# Patient Record
Sex: Female | Born: 1995 | Race: Black or African American | Hispanic: No | Marital: Single | Smoking: Former smoker
Health system: Southern US, Community
[De-identification: ages and names within clinical notes are randomized; demographics above are authoritative.]

## PROBLEM LIST (undated history)

## (undated) ENCOUNTER — Inpatient Hospital Stay (HOSPITAL_COMMUNITY): Payer: Self-pay

## (undated) DIAGNOSIS — J45909 Unspecified asthma, uncomplicated: Secondary | ICD-10-CM

## (undated) HISTORY — PX: TOE SURGERY: SHX1073

---
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
## 2013-01-06 ENCOUNTER — Encounter (HOSPITAL_COMMUNITY): Payer: Self-pay | Admitting: Emergency Medicine

## 2013-01-06 DIAGNOSIS — R112 Nausea with vomiting, unspecified: Secondary | ICD-10-CM | POA: Insufficient documentation

## 2013-01-06 DIAGNOSIS — R1011 Right upper quadrant pain: Secondary | ICD-10-CM | POA: Insufficient documentation

## 2013-01-06 DIAGNOSIS — Z8742 Personal history of other diseases of the female genital tract: Secondary | ICD-10-CM | POA: Insufficient documentation

## 2013-01-06 DIAGNOSIS — Z3202 Encounter for pregnancy test, result negative: Secondary | ICD-10-CM | POA: Insufficient documentation

## 2013-01-06 DIAGNOSIS — R197 Diarrhea, unspecified: Secondary | ICD-10-CM | POA: Insufficient documentation

## 2013-01-06 DIAGNOSIS — F17200 Nicotine dependence, unspecified, uncomplicated: Secondary | ICD-10-CM | POA: Insufficient documentation

## 2013-01-06 DIAGNOSIS — R52 Pain, unspecified: Secondary | ICD-10-CM | POA: Insufficient documentation

## 2013-01-06 LAB — COMPREHENSIVE METABOLIC PANEL
AST: 16 U/L (ref 0–37)
Albumin: 3.8 g/dL (ref 3.5–5.2)
BUN: 8 mg/dL (ref 6–23)
CO2: 28 mEq/L (ref 19–32)
Chloride: 100 mEq/L (ref 96–112)
Creatinine, Ser: 0.66 mg/dL (ref 0.50–1.10)
Glucose, Bld: 95 mg/dL (ref 70–99)
Total Protein: 8 g/dL (ref 6.0–8.3)

## 2013-01-06 LAB — CBC WITH DIFFERENTIAL/PLATELET
Basophils Relative: 0 % (ref 0–1)
Eosinophils Relative: 1 % (ref 0–5)
MCV: 84.7 fL (ref 78.0–100.0)
Monocytes Absolute: 0.8 10*3/uL (ref 0.1–1.0)
Platelets: 365 10*3/uL (ref 150–400)
RBC: 4.39 MIL/uL (ref 3.87–5.11)
RDW: 14.4 % (ref 11.5–15.5)

## 2013-01-06 LAB — URINALYSIS, ROUTINE W REFLEX MICROSCOPIC
Glucose, UA: NEGATIVE mg/dL
Hgb urine dipstick: NEGATIVE
Ketones, ur: NEGATIVE mg/dL
Nitrite: NEGATIVE
Protein, ur: NEGATIVE mg/dL
Urobilinogen, UA: 0.2 mg/dL (ref 0.0–1.0)

## 2013-01-06 LAB — POCT PREGNANCY, URINE: Preg Test, Ur: NEGATIVE

## 2013-01-06 NOTE — ED Notes (Signed)
Pt c/o RLQ pain onset yesterday with nausea, vomiting and diarrhea.

## 2013-01-07 ENCOUNTER — Emergency Department (HOSPITAL_COMMUNITY): Payer: Medicaid Other

## 2013-01-07 ENCOUNTER — Emergency Department (HOSPITAL_COMMUNITY)
Admission: EM | Admit: 2013-01-07 | Discharge: 2013-01-07 | Disposition: A | Discharge status: Home / Self Care | Payer: Medicaid Other | Attending: Emergency Medicine | Admitting: Emergency Medicine

## 2013-01-07 DIAGNOSIS — R197 Diarrhea, unspecified: Secondary | ICD-10-CM

## 2013-01-07 DIAGNOSIS — R109 Unspecified abdominal pain: Secondary | ICD-10-CM

## 2013-01-07 DIAGNOSIS — R112 Nausea with vomiting, unspecified: Secondary | ICD-10-CM

## 2013-01-07 MED ORDER — ONDANSETRON 4 MG PO TBDP
8.0000 mg | ORAL_TABLET | Freq: Once | ORAL | Status: AC
  Administered 2013-01-07: 8 mg via ORAL
  Filled 2013-01-07: qty 2

## 2013-01-07 MED ORDER — ONDANSETRON 8 MG PO TBDP: 8.0000 mg | ORAL_TABLET | Freq: Three times a day (TID) | ORAL | Status: DC | PRN

## 2013-01-07 NOTE — ED Notes (Signed)
Pt was asking about getting tested for STD's, nurse informed pt of the process. Pt then refused, saying she "doesn't want to stay here any longer"

## 2013-01-07 NOTE — ED Notes (Signed)
Pt ambulated to bathroom 

## 2013-01-07 NOTE — ED Notes (Signed)
MD at bedside. 

## 2013-01-07 NOTE — ED Provider Notes (Signed)
History     CSN: [id number]  Arrival date & time 01/06/13  2018   First MD Initiated Contact with Patient 01/07/13 0023      Chief Complaint  Patient presents with  . Abdominal Pain    (Consider location/radiation/quality/duration/timing/severity/associated sxs/prior treatment) HPI 17 year old female presents to the emergency department with complaint of abdominal pain associated with nausea and vomiting, diarrhea.  She reports the pain is sharp and radiates into her back.  Symptoms have been ongoing for the last 2 days.  Pain is somewhat worse after eating.  It is intermittent.  She's not having any pain currently.  She reports her last bowel movement was 2-3 days ago which is unusual, but she has not been eating very much the last 2 days either.  No sick contacts, no unusual foods, no travel.  Mother has history of gallbladder disease.  Patient without prior history of cholelithiasis.  Patient reports she has a history of amenorrhea, last period was 3 months ago.  She's been advised to have an ultrasound as she was told by her GYN that "my ovaries are swollen".  She denies any urinary symptoms, no vaginal discharge.  No lower abdominal pain  History reviewed. No pertinent past medical history.  History reviewed. No pertinent past surgical history.  No family history on file.  History  Substance Use Topics  . Smoking status: Current Every Day Smoker  . Smokeless tobacco: Not on file  . Alcohol Use: No    OB History   Grav Para Term Preterm Abortions TAB SAB Ect Mult Living                  Review of Systems  All other systems reviewed and are negative.    Allergies  Onion and Pork-derived products  Home Medications  No current outpatient prescriptions on file.  BP 138/79  Pulse 66  Temp(Src) 98.2 F (36.8 C) (Oral)  Resp 20  SpO2 100%  LMP 11/05/2012  Physical Exam  Nursing note and vitals reviewed. Constitutional: She is oriented to person, place, and time.  She appears well-developed and well-nourished.  Obese female, irritated but in no acute distress  HENT:  Head: Normocephalic and atraumatic.  Nose: Nose normal.  Mouth/Throat: Oropharynx is clear and moist.  Eyes: Conjunctivae and EOM are normal. Pupils are equal, round, and reactive to light.  Neck: Normal range of motion. Neck supple. No JVD present. No tracheal deviation present. No thyromegaly present.  Cardiovascular: Normal rate, regular rhythm, normal heart sounds and intact distal pulses.  Exam reveals no gallop and no friction rub.   No murmur heard. Pulmonary/Chest: Effort normal and breath sounds normal. No stridor. No respiratory distress. She has no wheezes. She has no rales. She exhibits no tenderness.  Abdominal: Soft. Bowel sounds are normal. She exhibits no distension and no mass. There is tenderness (Tender to palpation throughout the abdomen, worse in the right upper quadrant). There is no rebound and no guarding.  Musculoskeletal: Normal range of motion. She exhibits no edema and no tenderness.  Lymphadenopathy:    She has no cervical adenopathy.  Neurological: She is alert and oriented to person, place, and time. She exhibits normal muscle tone. Coordination normal.  Skin: Skin is warm and dry. No rash noted. No erythema. No pallor.  Psychiatric: She has a normal mood and affect. Her behavior is normal. Judgment and thought content normal.    ED Course  Procedures (including critical care time)  Labs Reviewed  COMPREHENSIVE  METABOLIC PANEL - Abnormal; Notable for the following:    Total Bilirubin 0.1 (*)    All other components within normal limits  URINALYSIS, ROUTINE W REFLEX MICROSCOPIC - Abnormal; Notable for the following:    Leukocytes, UA SMALL (*)    All other components within normal limits  URINE MICROSCOPIC-ADD ON - Abnormal; Notable for the following:    Squamous Epithelial / LPF FEW (*)    All other components within normal limits  CBC WITH  DIFFERENTIAL  POCT PREGNANCY, URINE   No results found.   1. Abdominal pain, acute   2. Nausea vomiting and diarrhea       MDM  17 year old female with 2 days intermittent sharp pain mainly in her right upper quadrant.  Concern for gallstones.  Your lab work is unremarkable she's had no fever.  No vomiting since arriving at the emergency Department 4 hours ago.  Patient still reports nausea, and will be given odt Zofran and will plan for ultrasound for possible gallstones.      1:58 AM Pt not wanting to wait for u/s for possible gallstones.  No acute pain at this time, no n/v here, labs reassuring.  Will d/c home, advised to have outpatient w/u if symptoms continue.  Olivia Mackie, MD 01/07/13 0230

## 2013-01-07 NOTE — ED Notes (Signed)
Pt informed nurse she just wanted nausea medication and to go home. Nurse explained the risks of leaving AMA, and pt agreed to stay.

## 2013-02-12 ENCOUNTER — Encounter (HOSPITAL_COMMUNITY): Payer: Self-pay

## 2013-02-12 DIAGNOSIS — Z3202 Encounter for pregnancy test, result negative: Secondary | ICD-10-CM | POA: Insufficient documentation

## 2013-02-12 DIAGNOSIS — F17200 Nicotine dependence, unspecified, uncomplicated: Secondary | ICD-10-CM | POA: Insufficient documentation

## 2013-02-12 DIAGNOSIS — N949 Unspecified condition associated with female genital organs and menstrual cycle: Secondary | ICD-10-CM | POA: Insufficient documentation

## 2013-02-12 DIAGNOSIS — J45909 Unspecified asthma, uncomplicated: Secondary | ICD-10-CM | POA: Insufficient documentation

## 2013-02-12 LAB — POCT PREGNANCY, URINE: Preg Test, Ur: NEGATIVE

## 2013-02-12 NOTE — ED Notes (Signed)
Patient presents with right lower abdominal pain and pelvic pain x 2 days. Associated with intermittent nausea, vomiting, sweats, chills and lower back pain. No flank pain. Denies fevers, diarrhea or constipation. Patient reports that there may be a change of pregnancy. LMP was in the beginning of February. Patient not on birth control and is having unprotected sex.

## 2013-02-13 ENCOUNTER — Emergency Department (HOSPITAL_COMMUNITY)
Admission: EM | Admit: 2013-02-13 | Discharge: 2013-02-13 | Disposition: A | Discharge status: Home / Self Care | Payer: Medicaid Other | Attending: Emergency Medicine | Admitting: Emergency Medicine

## 2013-02-13 DIAGNOSIS — R102 Pelvic and perineal pain: Secondary | ICD-10-CM

## 2013-02-13 HISTORY — DX: Unspecified asthma, uncomplicated: J45.909

## 2013-02-13 LAB — URINALYSIS, MICROSCOPIC ONLY
Glucose, UA: NEGATIVE mg/dL
Hgb urine dipstick: NEGATIVE
Ketones, ur: NEGATIVE mg/dL
Protein, ur: NEGATIVE mg/dL
Specific Gravity, Urine: 1.024 (ref 1.005–1.030)

## 2013-02-13 LAB — COMPREHENSIVE METABOLIC PANEL
ALT: 18 U/L (ref 0–35)
AST: 21 U/L (ref 0–37)
Albumin: 4.1 g/dL (ref 3.5–5.2)
Calcium: 9.7 mg/dL (ref 8.4–10.5)
GFR calc Af Amer: >90 mL/min (ref >90)
Glucose, Bld: 84 mg/dL (ref 70–99)
Sodium: 139 mEq/L (ref 135–145)

## 2013-02-13 LAB — CBC WITH DIFFERENTIAL/PLATELET
Basophils Absolute: 0.1 10*3/uL (ref 0.0–0.1)
Eosinophils Absolute: 0.1 10*3/uL (ref 0.0–0.7)
Eosinophils Relative: 1 % (ref 0–5)
HCT: 38.4 % (ref 36.0–46.0)
Lymphs Abs: 3.8 10*3/uL (ref 0.7–4.0)
MCHC: 34.6 g/dL (ref 30.0–36.0)
MCV: 83.3 fL (ref 78.0–100.0)
Monocytes Absolute: 1.2 10*3/uL — ABNORMAL HIGH (ref 0.1–1.0)
Monocytes Relative: 9 % (ref 3–12)
Platelets: 418 10*3/uL — ABNORMAL HIGH (ref 150–400)
RBC: 4.61 MIL/uL (ref 3.87–5.11)
RDW: 14.3 % (ref 11.5–15.5)

## 2013-02-13 LAB — WET PREP, GENITAL: Yeast Wet Prep HPF POC: NONE SEEN

## 2013-02-13 MED ORDER — LIDOCAINE HCL (PF) 1 % IJ SOLN
INTRAMUSCULAR | Status: AC
  Administered 2013-02-13: 2 mL
  Filled 2013-02-13: qty 5

## 2013-02-13 MED ORDER — HYDROCODONE-ACETAMINOPHEN 5-325 MG PO TABS: 1.0000 | ORAL_TABLET | Freq: Four times a day (QID) | ORAL | Status: DC | PRN

## 2013-02-13 MED ORDER — OXYCODONE-ACETAMINOPHEN 5-325 MG PO TABS
1.0000 | ORAL_TABLET | Freq: Once | ORAL | Status: AC
  Administered 2013-02-13: 1 via ORAL
  Filled 2013-02-13: qty 1

## 2013-02-13 MED ORDER — AZITHROMYCIN 1 G PO PACK
1.0000 g | PACK | Freq: Once | ORAL | Status: AC
  Administered 2013-02-13: 1 g via ORAL
  Filled 2013-02-13: qty 1

## 2013-02-13 MED ORDER — METRONIDAZOLE 500 MG PO TABS: 500.0000 mg | ORAL_TABLET | Freq: Three times a day (TID) | ORAL | Status: DC

## 2013-02-13 MED ORDER — DOXYCYCLINE HYCLATE 100 MG PO CAPS: 100.0000 mg | ORAL_CAPSULE | Freq: Two times a day (BID) | ORAL | Status: DC

## 2013-02-13 MED ORDER — CEFTRIAXONE SODIUM 250 MG IJ SOLR
250.0000 mg | Freq: Once | INTRAMUSCULAR | Status: AC
  Administered 2013-02-13: 250 mg via INTRAMUSCULAR
  Filled 2013-02-13: qty 250

## 2013-02-13 NOTE — ED Provider Notes (Signed)
History     CSN: [id number]  Arrival date & time 02/12/13  2307   First MD Initiated Contact with Patient 02/13/13 0040      Chief Complaint  Patient presents with  . Abdominal Pain    (Consider location/radiation/quality/duration/timing/severity/associated sxs/prior treatment) Patient is a 17 y.o. female presenting with abdominal pain. The history is provided by the patient (the pt complains of lower abd pain).  Abdominal Pain Pain location: lower abd pain bilaterally. Pain quality: aching   Pain radiates to:  Does not radiate Pain severity:  Moderate Onset quality:  Gradual Timing:  Constant Progression:  Waxing and waning Context: not alcohol use   Associated symptoms: no chest pain, no cough, no diarrhea, no fatigue and no hematuria     Past Medical History  Diagnosis Date  . Asthma     History reviewed. No pertinent past surgical history.  No family history on file.  History  Substance Use Topics  . Smoking status: Current Every Day Smoker -- 0.50 packs/day  . Smokeless tobacco: Never Used  . Alcohol Use: No    OB History   Grav Para Term Preterm Abortions TAB SAB Ect Mult Living                  Review of Systems  Constitutional: Negative for fatigue.  HENT: Negative for congestion, sinus pressure and ear discharge.   Eyes: Negative for discharge.  Respiratory: Negative for cough.   Cardiovascular: Negative for chest pain.  Gastrointestinal: Positive for abdominal pain. Negative for diarrhea.  Genitourinary: Negative for frequency and hematuria.  Musculoskeletal: Negative for back pain.  Skin: Negative for rash.  Neurological: Negative for seizures and headaches.  Psychiatric/Behavioral: Negative for hallucinations.    Allergies  Onion and Pork-derived products  Home Medications   Current Outpatient Rx  Name  Route  Sig  Dispense  Refill  . doxycycline (VIBRAMYCIN) 100 MG capsule   Oral   Take 1 capsule (100 mg total) by mouth 2 (two)  times daily. One po bid x 7 days   20 capsule   0   . HYDROcodone-acetaminophen (NORCO/VICODIN) 5-325 MG per tablet   Oral   Take 1 tablet by mouth every 6 (six) hours as needed for pain.   20 tablet   0   . metroNIDAZOLE (FLAGYL) 500 MG tablet   Oral   Take 1 tablet (500 mg total) by mouth 3 (three) times daily.   30 tablet   0     BP 145/51  Pulse 81  Temp(Src) 98.2 F (36.8 C) (Oral)  Resp 14  SpO2 100%  Physical Exam  Constitutional: She is oriented to person, place, and time. She appears well-developed.  HENT:  Head: Normocephalic and atraumatic.  Eyes: Conjunctivae and EOM are normal. No scleral icterus.  Neck: Neck supple. No thyromegaly present.  Cardiovascular: Normal rate and regular rhythm.  Exam reveals no gallop and no friction rub.   No murmur heard. Pulmonary/Chest: No stridor. She has no wheezes. She has no rales. She exhibits no tenderness.  Abdominal: She exhibits no distension. There is tenderness. There is no rebound.  Tender llq and rlq  Genitourinary:  Tender cervix  Musculoskeletal: Normal range of motion. She exhibits no edema.  Lymphadenopathy:    She has no cervical adenopathy.  Neurological: She is oriented to person, place, and time. Coordination normal.  Skin: No rash noted. No erythema.  Psychiatric: She has a normal mood and affect. Her behavior is normal.  ED Course  Procedures (including critical care time)  Labs Reviewed  WET PREP, GENITAL - Abnormal; Notable for the following:    Clue Cells Wet Prep HPF POC FEW (*)    WBC, Wet Prep HPF POC FEW (*)    All other components within normal limits  URINALYSIS, MICROSCOPIC ONLY - Abnormal; Notable for the following:    Leukocytes, UA TRACE (*)    Bacteria, UA MANY (*)    Squamous Epithelial / LPF MANY (*)    All other components within normal limits  CBC WITH DIFFERENTIAL - Abnormal; Notable for the following:    WBC 12.7 (*)    Platelets 418 (*)    Monocytes Absolute 1.2 (*)     All other components within normal limits  COMPREHENSIVE METABOLIC PANEL - Abnormal; Notable for the following:    Total Bilirubin 0.2 (*)    All other components within normal limits  URINE CULTURE  GC/CHLAMYDIA PROBE AMP  LIPASE, BLOOD  POCT PREGNANCY, URINE   No results found.   1. Pelvic pain       MDM          Benny Lennert, MD 02/13/13 [phone]

## 2013-02-13 NOTE — ED Notes (Signed)
MD at bedside. 

## 2013-02-14 LAB — URINE CULTURE: Colony Count: 50000

## 2013-02-14 LAB — GC/CHLAMYDIA PROBE AMP: GC Probe RNA: POSITIVE — AB

## 2013-02-15 ENCOUNTER — Emergency Department (INDEPENDENT_AMBULATORY_CARE_PROVIDER_SITE_OTHER)
Admission: EM | Admit: 2013-02-15 | Discharge: 2013-02-15 | Disposition: A | Discharge status: Other Acute Inpatient Hospital | Payer: Medicaid Other | Source: Home / Self Care | Attending: Emergency Medicine | Admitting: Emergency Medicine

## 2013-02-15 ENCOUNTER — Telehealth (HOSPITAL_COMMUNITY): Payer: Self-pay | Admitting: Emergency Medicine

## 2013-02-15 ENCOUNTER — Inpatient Hospital Stay (HOSPITAL_COMMUNITY)
Admission: AD | Admit: 2013-02-15 | Discharge: 2013-02-15 | Disposition: A | Discharge status: Home / Self Care | Payer: Medicaid Other | Source: Ambulatory Visit | Attending: Obstetrics & Gynecology | Admitting: Obstetrics & Gynecology

## 2013-02-15 ENCOUNTER — Encounter (HOSPITAL_COMMUNITY): Payer: Self-pay

## 2013-02-15 ENCOUNTER — Encounter (HOSPITAL_COMMUNITY): Payer: Self-pay | Admitting: *Deleted

## 2013-02-15 DIAGNOSIS — N739 Female pelvic inflammatory disease, unspecified: Secondary | ICD-10-CM

## 2013-02-15 DIAGNOSIS — N949 Unspecified condition associated with female genital organs and menstrual cycle: Secondary | ICD-10-CM | POA: Insufficient documentation

## 2013-02-15 DIAGNOSIS — N730 Acute parametritis and pelvic cellulitis: Secondary | ICD-10-CM

## 2013-02-15 MED ORDER — AZITHROMYCIN 250 MG PO TABS
ORAL_TABLET | ORAL | Status: AC
  Filled 2013-02-15: qty 4

## 2013-02-15 MED ORDER — CEFTRIAXONE SODIUM 1 G IJ SOLR
INTRAMUSCULAR | Status: AC
  Filled 2013-02-15: qty 10

## 2013-02-15 MED ORDER — AZITHROMYCIN 250 MG PO TABS
1000.0000 mg | ORAL_TABLET | Freq: Once | ORAL | Status: AC
  Administered 2013-02-15: 1000 mg via ORAL

## 2013-02-15 MED ORDER — ONDANSETRON 4 MG PO TBDP: 4.0000 mg | ORAL_TABLET | Freq: Four times a day (QID) | ORAL | Status: DC | PRN

## 2013-02-15 MED ORDER — LIDOCAINE HCL (PF) 1 % IJ SOLN
INTRAMUSCULAR | Status: AC
  Filled 2013-02-15: qty 5

## 2013-02-15 MED ORDER — CEFTRIAXONE SODIUM 1 G IJ SOLR
1.0000 g | Freq: Once | INTRAMUSCULAR | Status: AC
  Administered 2013-02-15: 1 g via INTRAMUSCULAR

## 2013-02-15 NOTE — ED Notes (Signed)
Seen in ED 3-31, and 4-1 for syx; positive for GC and chlamydia. Call from flow manager, pt did not receive adequate coverage for her lab reports. States her pain is worse, hurts alll over, but worse in lower abdominal area, and labial lips, epigastric distress; partner her w pt was encouraged to sign in for exam

## 2013-02-15 NOTE — MAU Note (Signed)
Patient states she has been having pelvic pain since 3-30. Was seen at [facility] ED and treated for GC/CT and PID. Patient did not get the antibiotics filled for the PID. Was seen at Urgent Care today and sent to MAU for evaluation. Having a vaginal discharge.

## 2013-02-15 NOTE — MAU Provider Note (Signed)
History     CSN: [id number]  Arrival date and time: 02/15/13 1644   None     Chief Complaint  Patient presents with  . Pelvic Pain  . Vaginal Discharge   HPI 17 y.o. G2P0020 not pregnant with bilateral pelvic pain for 5 days. Seen at [facility] on Monday and treated for gonrorrhea and chlamydia. Given prescription for doxycycline and metronidazole and pain medication but did not fill prescriptions.   Still having pain 8/10, comes and goes, sharp. Nothing makes it better or worse. No fever/chills. Did have nausea and vomiting but resolved.   LMP:  02/07/13   OB History   Grav Para Term Preterm Abortions TAB SAB Ect Mult Living   2 0   2  2         Past Medical History  Diagnosis Date  . Asthma     Past Surgical History  Procedure Laterality Date  . No past surgeries      No family history on file.  History  Substance Use Topics  . Smoking status: Former Smoker -- 0.50 packs/day    Quit date: 11/17/2012  . Smokeless tobacco: Never Used  . Alcohol Use: Yes     Comment: occational    Allergies:  Allergies  Allergen Reactions  . Onion Other (See Comments)    Upset stomach  . Pork-Derived Products Other (See Comments)    Religious preference    Prescriptions prior to admission  Medication Sig Dispense Refill  . doxycycline (VIBRAMYCIN) 100 MG capsule Take 100 mg by mouth 2 (two) times daily. x 7 days      . HYDROcodone-acetaminophen (NORCO/VICODIN) 5-325 MG per tablet Take 1 tablet by mouth every 6 (six) hours as needed for pain.  20 tablet  0  . metroNIDAZOLE (FLAGYL) 500 MG tablet Take 1 tablet (500 mg total) by mouth 3 (three) times daily.  30 tablet  0  . [DISCONTINUED] doxycycline (VIBRAMYCIN) 100 MG capsule Take 1 capsule (100 mg total) by mouth 2 (two) times daily. One po bid x 7 days  20 capsule  0    ROS  See HPI. Vaginal discharge minimal, no odor. No dysuria, no bleeding.  Physical Exam   Blood pressure 104/60, pulse 75, temperature 98.4 F (36.9  C), temperature source Oral, resp. rate 16, height 5\' 3"  (1.6 m), weight 79.561 kg (175 lb 6.4 oz), last menstrual period 02/07/2013, SpO2 100.00%.  Physical Exam GEN:  Alert, oriented, no distress CV:  RRR, no murmur RESP:  CTAB ABD:  Soft, non-distended, normal bowel sounds, mild tenderness in lower quadrants and suprapubic area, no guarding or rebound. EXTREM:  Warm, well perfused, no edema  Results for orders placed during the hospital encounter of 02/15/13 (from the past 72 hour(s))  POCT PREGNANCY, URINE     Status: None   Collection Time    02/15/13  2:34 PM      Result Value Range   Preg Test, Ur NEGATIVE  NEGATIVE   Comment:            THE SENSITIVITY OF THIS     METHODOLOGY IS >24 mIU/mL     MAU Course  Procedures   Assessment and Plan  17 y.o. G2P0020, not pregnant with pelvic pain, PID - Abdomen non-surgical - Patient advised that longer treatment of PID is necessary - she needs to pick up rx and complete treament. - Return for fever/chills, intractable nausea/vomiting, pain not controlled with pain meds  Napoleon Form 02/15/2013,  6:04 PM

## 2013-02-15 NOTE — ED Provider Notes (Addendum)
History     CSN: [id number]  Arrival date & time 02/15/13  1209   First MD Initiated Contact with Patient 02/15/13 1239      Chief Complaint  Patient presents with  . Abdominal Pain    (Consider location/radiation/quality/duration/timing/severity/associated sxs/prior treatment) HPI Comments: Patient presents to urgent care describing increased pelvic pain and feeling nauseous and having chills. She's also describing that she is having stomach pain as she has been taking doxycycline and Flagyl. He presented to urgent care this morning as a result of her pain getting worse. While patient was waiting for manager called nursing staff informing of positive STD screening test for both gonorrhea and chlamydia. Patient denies any urinary symptoms such as increased frequency pressure burning with urination denies any vomiting or diarrhea as. Patient denies having experienced a previous sexual transmitted disease in the past.  Patient is a 17 y.o. female presenting with abdominal pain. The history is provided by the patient.  Abdominal Pain Pain location:  Suprapubic, LLQ and RLQ Pain quality: fullness and sharp   Pain radiates to:  Does not radiate Pain severity:  Moderate Onset quality:  Gradual Duration:  4 days Timing:  Constant Progression:  Worsening Context: not retching and not trauma   Relieved by:  Nothing Worsened by:  Nothing tried Ineffective treatments: Taking doxycycline and Flagyl. Associated symptoms: anorexia, chills and vaginal discharge   Associated symptoms: no cough, no diarrhea, no dysuria, no fever, no hematuria, no melena, no vaginal bleeding and no vomiting   Risk factors: no alcohol abuse     Past Medical History  Diagnosis Date  . Asthma     History reviewed. No pertinent past surgical history.  History reviewed. No pertinent family history.  History  Substance Use Topics  . Smoking status: Current Every Day Smoker -- 0.50 packs/day  . Smokeless  tobacco: Never Used  . Alcohol Use: No    OB History   Grav Para Term Preterm Abortions TAB SAB Ect Mult Living                  Review of Systems  Constitutional: Positive for chills and activity change. Negative for fever and diaphoresis.  Respiratory: Negative for cough.   Gastrointestinal: Positive for abdominal pain and anorexia. Negative for vomiting, diarrhea and melena.  Genitourinary: Positive for vaginal discharge and pelvic pain. Negative for dysuria, urgency, frequency, hematuria, flank pain, vaginal bleeding and genital sores.    Allergies  Onion and Pork-derived products  Home Medications   Current Outpatient Rx  Name  Route  Sig  Dispense  Refill  . doxycycline (VIBRAMYCIN) 100 MG capsule   Oral   Take 1 capsule (100 mg total) by mouth 2 (two) times daily. One po bid x 7 days   20 capsule   0   . HYDROcodone-acetaminophen (NORCO/VICODIN) 5-325 MG per tablet   Oral   Take 1 tablet by mouth every 6 (six) hours as needed for pain.   20 tablet   0   . metroNIDAZOLE (FLAGYL) 500 MG tablet   Oral   Take 1 tablet (500 mg total) by mouth 3 (three) times daily.   30 tablet   0     BP 119/67  Pulse 74  Temp(Src) 97.9 F (36.6 C) (Oral)  SpO2 98%  Physical Exam  Nursing note and vitals reviewed. Constitutional: She appears distressed.  Abdominal: Soft. Normal appearance. There is no hepatosplenomegaly. There is no rigidity, no rebound, no guarding, no CVA tenderness,  no tenderness at McBurney's point and negative Murphy's sign. No hernia.  Genitourinary: There is no rash on the right labia. There is no rash on the left labia. Uterus is tender. Cervix exhibits motion tenderness and discharge. Cervix exhibits no friability. Right adnexum displays tenderness. Right adnexum displays no mass. Left adnexum displays tenderness. No erythema or bleeding around the vagina. No foreign body around the vagina. Vaginal discharge found.  Neurological: She is alert.  Skin:  No rash noted. No erythema.    ED Course  Procedures (including critical care time)  Labs Reviewed  POCT PREGNANCY, URINE   No results found.   1. Pelvic inflammatory disease (PID)       MDM  PID- treated today at urgent care with both ceftriaxone and azithromycin. Patient tolerated treatment well.  Exacerbated pelvic pain in the setting of positive coronary and chlamydia test. Pelvic exam is concerning as to create the indication to rule out a tubal ovarian abscess.  Patient has Medicaid but has not been seen by primary care Dr.- We attempted to order a pelvic ultrasound - imaging department, unable to perform pre-authorization's. [facility].     Jimmie Molly, MD 02/15/13 1419  Jimmie Molly, MD 02/15/13 [phone]

## 2013-02-16 NOTE — MAU Provider Note (Signed)
Attestation of Attending Supervision of Advanced Practitioner (CNM/NP): Evaluation and management procedures were performed by the Advanced Practitioner under my supervision and collaboration.  I have reviewed the Advanced Practitioner's note and chart, and I agree with the management and plan.  HARRAWAY-SMITH, Sherea Liptak 7:02 AM     

## 2013-08-08 ENCOUNTER — Encounter (HOSPITAL_COMMUNITY): Payer: Self-pay | Admitting: *Deleted

## 2013-08-08 ENCOUNTER — Emergency Department (HOSPITAL_COMMUNITY)
Admission: EM | Admit: 2013-08-08 | Discharge: 2013-08-08 | Disposition: A | Discharge status: Home / Self Care | Payer: Medicaid Other | Attending: Emergency Medicine | Admitting: Emergency Medicine

## 2013-08-08 DIAGNOSIS — S01511A Laceration without foreign body of lip, initial encounter: Secondary | ICD-10-CM

## 2013-08-08 DIAGNOSIS — S01501A Unspecified open wound of lip, initial encounter: Secondary | ICD-10-CM | POA: Insufficient documentation

## 2013-08-08 DIAGNOSIS — J45909 Unspecified asthma, uncomplicated: Secondary | ICD-10-CM | POA: Insufficient documentation

## 2013-08-08 DIAGNOSIS — Z87891 Personal history of nicotine dependence: Secondary | ICD-10-CM | POA: Insufficient documentation

## 2013-08-08 NOTE — ED Notes (Signed)
Please call mother when pt is done w/ care to let her know what was done:  MotherLucina [phone]

## 2013-08-08 NOTE — ED Provider Notes (Signed)
Medical screening examination/treatment/procedure(s) were performed by non-physician practitioner and as supervising physician I was immediately available for consultation/collaboration.  Noretta Frier L Katianne Barre, MD 08/08/13 2253 
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
## 2013-08-08 NOTE — Progress Notes (Signed)
Patient confirms she does not have a pcp.  EDCM provided patient with a list of pcps in [place] who accept [employer].  No further needs at this time.

## 2013-12-19 ENCOUNTER — Emergency Department (HOSPITAL_COMMUNITY)
Admission: EM | Admit: 2013-12-19 | Discharge: 2013-12-19 | Discharge status: Against Medical Advice | Payer: Medicaid Other | Attending: Emergency Medicine | Admitting: Emergency Medicine

## 2013-12-19 DIAGNOSIS — J45901 Unspecified asthma with (acute) exacerbation: Secondary | ICD-10-CM | POA: Insufficient documentation

## 2013-12-19 DIAGNOSIS — R064 Hyperventilation: Secondary | ICD-10-CM | POA: Insufficient documentation

## 2013-12-19 DIAGNOSIS — Z87891 Personal history of nicotine dependence: Secondary | ICD-10-CM | POA: Insufficient documentation

## 2013-12-19 MED ORDER — LORAZEPAM 1 MG PO TABS
1.0000 mg | ORAL_TABLET | Freq: Once | ORAL | Status: DC
  Filled 2013-12-19: qty 2

## 2013-12-19 NOTE — ED Notes (Signed)
Pt in c/o feeling short of breath throughout the day, states she started hyperventilating about 10 minutes ago, states her hands have started turning blue and she felt really cold, pt speaking in full sentences, tearful

## 2013-12-19 NOTE — ED Notes (Signed)
Pt not in room; not in waiting room; not in xray.

## 2014-01-02 ENCOUNTER — Encounter (HOSPITAL_COMMUNITY): Payer: Self-pay | Admitting: Emergency Medicine

## 2014-01-02 ENCOUNTER — Emergency Department (INDEPENDENT_AMBULATORY_CARE_PROVIDER_SITE_OTHER)
Admission: EM | Admit: 2014-01-02 | Discharge: 2014-01-02 | Disposition: A | Discharge status: Home / Self Care | Payer: Medicaid Other | Source: Home / Self Care

## 2014-01-02 DIAGNOSIS — R109 Unspecified abdominal pain: Secondary | ICD-10-CM

## 2014-01-02 LAB — POCT URINALYSIS DIP (DEVICE)
Bilirubin Urine: NEGATIVE
Glucose, UA: NEGATIVE mg/dL
Ketones, ur: NEGATIVE mg/dL
Nitrite: NEGATIVE
Protein, ur: NEGATIVE mg/dL
Specific Gravity, Urine: 1.015 (ref 1.005–1.030)
Urobilinogen, UA: 0.2 mg/dL (ref 0.0–1.0)
pH: 7.5 (ref 5.0–8.0)

## 2014-01-02 LAB — POCT PREGNANCY, URINE: Preg Test, Ur: NEGATIVE

## 2014-01-02 NOTE — Discharge Instructions (Signed)
The cause of your abdominal pain is unclear but may be musculoskeletal in nature due to your recent return to work, ovulation, or even implantation pain from a fertilized egg. Please only take tylenol for the pain Please start a prenatal vitamin Please do daily stretching exercises and drink lots of water.

## 2014-01-02 NOTE — ED Notes (Signed)
Pt c/o abd pain onset 1 week and yest sxs worsen Sxs include: nauseas, diarrhea, urinary frequency Denies f/v, gyn sxs Alert w/no signs of acute distress.

## 2014-01-02 NOTE — ED Provider Notes (Signed)
CSN: [id number]     Arrival date & time 01/02/14  1505 History   None    Chief Complaint  Patient presents with  . Abdominal Pain     (Consider location/radiation/quality/duration/timing/severity/associated sxs/prior Treatment) HPI  Abd pain: LLQ and suprapubic. Started 7 days ago w/ worsening yesterday. Diarrhea started this am. Daily soft BM.  Associated w/ nausea. Denies fever, emesis, diarrhea. Denies sick contacts. LMP around the end of every month. Denies dysuria or frequency or vaginal irritation or discharge. Sexually active w/o protection. Last LMP was very light and on 12/12/13. Tolerating PO. Feels like breasts are enlarging and increasing appetite. Pain comes and goes. Some worsening w/ certain movements.    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 Asthma    Past Surgical History  Procedure Laterality Date  . No past surgeries     No family history on file. History  Substance Use Topics  . Smoking status: Former Smoker -- 0.50 packs/day    Quit date: 11/17/2012  . Smokeless tobacco: Never Used  . Alcohol Use: Yes     Comment: occational   OB History   Grav Para Term Preterm Abortions TAB SAB Ect Mult Living   2 0   2  2        Review of Systems  Constitutional: Negative for activity change and fatigue.  Respiratory: Negative for shortness of breath.   Gastrointestinal: Positive for abdominal pain. Negative for nausea, diarrhea and blood in stool.      Allergies  Onion and Pork-derived products  Home Medications  No current outpatient prescriptions on file. BP 120/77  Pulse 83  Temp(Src) 98.9 F (37.2 C) (Oral)  Resp 18  SpO2 100%  LMP 12/14/2013 Physical Exam  Constitutional: She is oriented to person, place, and time. She appears well-developed and well-nourished. No distress.  HENT:  Head: Normocephalic and atraumatic.  Eyes: EOM are normal.  Pupils are equal, round, and reactive to light.  Neck: Normal range of motion. Neck supple. No thyromegaly present.  Cardiovascular: Normal rate, regular rhythm, normal heart sounds and intact distal pulses.  Exam reveals no gallop and no friction rub.   No murmur heard. Pulmonary/Chest: Effort normal and breath sounds normal. No respiratory distress.  Abdominal: Soft. Bowel sounds are normal. She exhibits no distension and no mass. There is no tenderness. There is no rebound and no guarding.  Musculoskeletal: Normal range of motion.  No CVA tenderness  Neurological: She is alert and oriented to person, place, and time.  Skin: Skin is warm.  Psychiatric: She has a normal mood and affect. Her behavior is normal. Judgment and thought content normal.    ED Course  Procedures (including critical care time) Labs Review Labs Reviewed  POCT URINALYSIS DIP (DEVICE) - Abnormal; Notable for the following:    Hgb urine dipstick TRACE (*)    Leukocytes, UA SMALL (*)    All other components within normal limits  POCT PREGNANCY, URINE   Imaging Review No results found.    MDM   Final diagnoses:  Abdominal pain    17yo AAF w/ Abd pain of unclear etiology. Likely MSK pain from restarting a physically demanding job vs ovulation vs pregnancy. Urine preg neg but may be too early. No sign of vaginitis, UTI, GI condition such as constipation or diverticulitis.  - tylenol -  PNV - stretching and hydration discussed - work note provided - precautions given and all questions answered  Shelly Flattenavid Merrell, MD Family Medicine PGY-3 01/02/2014, 5:44 PM      Ozella Rocksavid J Merrell, MD 01/02/14 [phone]

## 2014-01-02 NOTE — ED Provider Notes (Signed)
Medical screening examination/treatment/procedure(s) were performed by resident physician or non-physician practitioner and as supervising physician I was immediately available for consultation/collaboration.   Jacyln Carmer DOUGLAS MD.   Kismet Facemire D Girard Koontz, MD 01/02/14 2010 

## 2014-03-03 ENCOUNTER — Inpatient Hospital Stay (HOSPITAL_COMMUNITY)
Admission: AD | Admit: 2014-03-03 | Discharge: 2014-03-03 | Disposition: A | Discharge status: Home / Self Care | Payer: Medicaid Other | Source: Ambulatory Visit | Attending: Obstetrics & Gynecology | Admitting: Obstetrics & Gynecology

## 2014-03-03 ENCOUNTER — Inpatient Hospital Stay (HOSPITAL_COMMUNITY): Payer: Medicaid Other

## 2014-03-03 ENCOUNTER — Encounter (HOSPITAL_COMMUNITY): Payer: Self-pay | Admitting: *Deleted

## 2014-03-03 DIAGNOSIS — Z87891 Personal history of nicotine dependence: Secondary | ICD-10-CM | POA: Insufficient documentation

## 2014-03-03 DIAGNOSIS — N760 Acute vaginitis: Secondary | ICD-10-CM | POA: Insufficient documentation

## 2014-03-03 DIAGNOSIS — R109 Unspecified abdominal pain: Secondary | ICD-10-CM | POA: Insufficient documentation

## 2014-03-03 DIAGNOSIS — A499 Bacterial infection, unspecified: Secondary | ICD-10-CM | POA: Insufficient documentation

## 2014-03-03 DIAGNOSIS — B9689 Other specified bacterial agents as the cause of diseases classified elsewhere: Secondary | ICD-10-CM | POA: Insufficient documentation

## 2014-03-03 DIAGNOSIS — Z3202 Encounter for pregnancy test, result negative: Secondary | ICD-10-CM | POA: Insufficient documentation

## 2014-03-03 LAB — CBC
HCT: 38 % (ref 36.0–46.0)
Hemoglobin: 12.4 g/dL (ref 12.0–15.0)
MCH: 27.7 pg (ref 26.0–34.0)
MCHC: 32.6 g/dL (ref 30.0–36.0)
MCV: 84.8 fL (ref 78.0–100.0)
PLATELETS: 326 10*3/uL (ref 150–400)
RBC: 4.48 MIL/uL (ref 3.87–5.11)
RDW: 15.8 % — AB (ref 11.5–15.5)
WBC: 8.3 10*3/uL (ref 4.0–10.5)

## 2014-03-03 LAB — URINALYSIS, ROUTINE W REFLEX MICROSCOPIC
BILIRUBIN URINE: NEGATIVE
GLUCOSE, UA: NEGATIVE mg/dL
KETONES UR: NEGATIVE mg/dL
LEUKOCYTES UA: NEGATIVE
Nitrite: NEGATIVE
PROTEIN: NEGATIVE mg/dL
Specific Gravity, Urine: 1.01 (ref 1.005–1.030)
Urobilinogen, UA: 0.2 mg/dL (ref 0.0–1.0)
pH: 7 (ref 5.0–8.0)

## 2014-03-03 LAB — WET PREP, GENITAL
Trich, Wet Prep: NONE SEEN
Yeast Wet Prep HPF POC: NONE SEEN

## 2014-03-03 LAB — COMPREHENSIVE METABOLIC PANEL
ALK PHOS: 52 U/L (ref 39–117)
ALT: 18 U/L (ref 0–35)
AST: 15 U/L (ref 0–37)
Albumin: 3.8 g/dL (ref 3.5–5.2)
BUN: 6 mg/dL (ref 6–23)
CO2: 27 meq/L (ref 19–32)
Calcium: 9.4 mg/dL (ref 8.4–10.5)
Chloride: 103 mEq/L (ref 96–112)
Creatinine, Ser: 0.64 mg/dL (ref 0.50–1.10)
GLUCOSE: 85 mg/dL (ref 70–99)
POTASSIUM: 4.2 meq/L (ref 3.7–5.3)
SODIUM: 142 meq/L (ref 137–147)
Total Bilirubin: 0.4 mg/dL (ref 0.3–1.2)
Total Protein: 7.7 g/dL (ref 6.0–8.3)

## 2014-03-03 LAB — URINE MICROSCOPIC-ADD ON

## 2014-03-03 LAB — HCG, QUANTITATIVE, PREGNANCY: hCG, Beta Chain, Quant, S: <1 m[IU]/mL (ref <5)

## 2014-03-03 LAB — ABO/RH: ABO/RH(D): O POS

## 2014-03-03 LAB — POCT PREGNANCY, URINE: Preg Test, Ur: NEGATIVE

## 2014-03-03 MED ORDER — METRONIDAZOLE 500 MG PO TABS: 500.0000 mg | ORAL_TABLET | Freq: Two times a day (BID) | ORAL | Status: DC

## 2014-03-03 NOTE — MAU Note (Signed)
Pt states 1 + pregnancy test at home and 2 negative.

## 2014-03-03 NOTE — MAU Note (Signed)
Pt presents to MAU with complaints of lower abdominal cramping for 3 days and thinks she may be pregnant. Denies any vaginal bleeding and states last normal period was in February.

## 2014-03-03 NOTE — MAU Provider Note (Signed)
History     CSN: [id number]  Arrival date and time: 03/03/14 1556   First Provider Initiated Contact with Patient 03/03/14 1636      Chief Complaint  Patient presents with  . Possible Pregnancy  . Abdominal Pain   HPI Ms. Brittany Bowers is a 18 y.o. G1P0010 who presents to MAU today for abdominal pain and possible pregnancy. The patient states LMP was 12/30/13. She states she had 1 +HPT and 1 negative HPT. She has been having lower abdominal pain x 3 days. She denies vaginal bleeding. She has had some nausea and one episode of vomiting yesterday. She has had some dizziness and weakness. She denies diarrhea, constipation, vaginal discharge or UTI symptoms.   OB History   Grav Para Term Preterm Abortions TAB SAB Ect Mult Living   1 0   1  1         Past Medical History  Diagnosis Date  . Asthma     Past Surgical History  Procedure Laterality Date  . No past surgeries      History reviewed. No pertinent family history.  History  Substance Use Topics  . Smoking status: Former Smoker -- 0.50 packs/day    Quit date: 11/17/2012  . Smokeless tobacco: Never Used  . Alcohol Use: Yes     Comment: occational    Allergies:  Allergies  Allergen Reactions  . Onion Other (See Comments)    Upset stomach  . Pork-Derived Products Other (See Comments)    Religious preference    No prescriptions prior to admission    Review of Systems  Constitutional: Positive for malaise/fatigue. Negative for fever.  Gastrointestinal: Positive for nausea, vomiting and abdominal pain. Negative for diarrhea and constipation.  Genitourinary: Negative for dysuria, urgency and frequency.       Neg - vaginal bleeding, discharge  Neurological: Positive for dizziness and weakness. Negative for loss of consciousness.   Physical Exam   Blood pressure 126/77, pulse 97, temperature 98.4 F (36.9 C), resp. rate 20, height 5\' 3"  (1.6 m), weight 75.751 kg (167 lb), last menstrual period  12/30/2013.  Physical Exam  Constitutional: She is oriented to person, place, and time. She appears well-developed and well-nourished. No distress.  HENT:  Head: Normocephalic and atraumatic.  Cardiovascular: Normal rate.   Respiratory: Effort normal.  GI: Soft. Bowel sounds are normal. She exhibits no distension and no mass. There is tenderness (mild diffuse tenderness to palpation). There is no rebound and no guarding.  Genitourinary: Uterus is not enlarged and not tender. Cervix exhibits no motion tenderness, no discharge and no friability. Right adnexum displays tenderness (very mild). Right adnexum displays no mass. Left adnexum displays no mass and no tenderness. No bleeding around the vagina. Vaginal discharge (small amount of thin, white discharge noted) found.  Neurological: She is alert and oriented to person, place, and time.  Skin: Skin is warm and dry. No erythema.  Psychiatric: She has a normal mood and affect.   Results for orders placed during the hospital encounter of 03/03/14 (from the past 24 hour(s))  URINALYSIS, ROUTINE W REFLEX MICROSCOPIC     Status: Abnormal   Collection Time    03/03/14  4:15 PM      Result Value Ref Range   Color, Urine YELLOW  YELLOW   APPearance CLEAR  CLEAR   Specific Gravity, Urine 1.010  1.005 - 1.030   pH 7.0  5.0 - 8.0   Glucose, UA NEGATIVE  NEGATIVE mg/dL  Hgb urine dipstick TRACE (*) NEGATIVE   Bilirubin Urine NEGATIVE  NEGATIVE   Ketones, ur NEGATIVE  NEGATIVE mg/dL   Protein, ur NEGATIVE  NEGATIVE mg/dL   Urobilinogen, UA 0.2  0.0 - 1.0 mg/dL   Nitrite NEGATIVE  NEGATIVE   Leukocytes, UA NEGATIVE  NEGATIVE  URINE MICROSCOPIC-ADD ON     Status: None   Collection Time    03/03/14  4:15 PM      Result Value Ref Range   Squamous Epithelial / LPF RARE  RARE   RBC / HPF 0-2  <3 RBC/hpf  POCT PREGNANCY, URINE     Status: None   Collection Time    03/03/14  4:26 PM      Result Value Ref Range   Preg Test, Ur NEGATIVE  NEGATIVE   HCG, QUANTITATIVE, PREGNANCY     Status: None   Collection Time    03/03/14  4:45 PM      Result Value Ref Range   hCG, Beta Chain, Quant, S <1  <5 mIU/mL  CBC     Status: Abnormal   Collection Time    03/03/14  4:45 PM      Result Value Ref Range   WBC 8.3  4.0 - 10.5 K/uL   RBC 4.48  3.87 - 5.11 MIL/uL   Hemoglobin 12.4  12.0 - 15.0 g/dL   HCT 40.9  81.1 - 91.4 %   MCV 84.8  78.0 - 100.0 fL   MCH 27.7  26.0 - 34.0 pg   MCHC 32.6  30.0 - 36.0 g/dL   RDW 78.2 (*) 95.6 - 21.3 %   Platelets 326  150 - 400 K/uL  ABO/RH     Status: None   Collection Time    03/03/14  4:45 PM      Result Value Ref Range   ABO/RH(D) O POS    COMPREHENSIVE METABOLIC PANEL     Status: None   Collection Time    03/03/14  4:45 PM      Result Value Ref Range   Sodium 142  137 - 147 mEq/L   Potassium 4.2  3.7 - 5.3 mEq/L   Chloride 103  96 - 112 mEq/L   CO2 27  19 - 32 mEq/L   Glucose, Bld 85  70 - 99 mg/dL   BUN 6  6 - 23 mg/dL   Creatinine, Ser 0.86  0.50 - 1.10 mg/dL   Calcium 9.4  8.4 - 57.8 mg/dL   Total Protein 7.7  6.0 - 8.3 g/dL   Albumin 3.8  3.5 - 5.2 g/dL   AST 15  0 - 37 U/L   ALT 18  0 - 35 U/L   Alkaline Phosphatase 52  39 - 117 U/L   Total Bilirubin 0.4  0.3 - 1.2 mg/dL   GFR calc non Af Amer >90  >90 mL/min   GFR calc Af Amer >90  >90 mL/min  WET PREP, GENITAL     Status: Abnormal   Collection Time    03/03/14  4:54 PM      Result Value Ref Range   Yeast Wet Prep HPF POC NONE SEEN  NONE SEEN   Trich, Wet Prep NONE SEEN  NONE SEEN   Clue Cells Wet Prep HPF POC FEW (*) NONE SEEN   WBC, Wet Prep HPF POC FEW (*) NONE SEEN   US Transvaginal Non-ob  03/03/2014   CLINICAL DATA:  Pelvic pain and cramping.  Negative pregnancy  test.  EXAM: TRANSABDOMINAL AND TRANSVAGINAL ULTRASOUND OF PELVIS  TECHNIQUE: Both transabdominal and transvaginal ultrasound examinations of the pelvis were performed. Transabdominal technique was performed for global imaging of the pelvis including uterus,  ovaries, adnexal regions, and pelvic cul-de-sac. It was necessary to proceed with endovaginal exam following the transabdominal exam to visualize the retroverted uterus and ovaries.  COMPARISON:  None  FINDINGS: Uterus  Measurements: 6.8 x 5.2 x 4.4 cm. Retroverted. No fibroids or other mass visualized.  Endometrium  Thickness: 17 mm.  No focal abnormality visualized.  Right ovary  Measurements: 3.9 x 2.4 x 3.0 cm. Normal appearance/no adnexal mass.  Left ovary  Measurements: 2.6 x 1.6 x 1.4 cm. Normal appearance/no adnexal mass.  Other findings  Tiny amount of free fluid in cul-de-sac and right adnexa.  IMPRESSION: Normal appearance of retroverted uterus and ovaries. No pelvic mass or other significant abnormality visualized.   Electronically Signed   By: Myles RosenthalJohn  Stahl M.D.   On: 03/03/2014 19:52   [place] Pelvis Complete  03/03/2014   CLINICAL DATA:  Pelvic pain and cramping.  Negative pregnancy test.  EXAM: TRANSABDOMINAL AND TRANSVAGINAL ULTRASOUND OF PELVIS  TECHNIQUE: Both transabdominal and transvaginal ultrasound examinations of the pelvis were performed. Transabdominal technique was performed for global imaging of the pelvis including uterus, ovaries, adnexal regions, and pelvic cul-de-sac. It was necessary to proceed with endovaginal exam following the transabdominal exam to visualize the retroverted uterus and ovaries.  COMPARISON:  None  FINDINGS: Uterus  Measurements: 6.8 x 5.2 x 4.4 cm. Retroverted. No fibroids or other mass visualized.  Endometrium  Thickness: 17 mm.  No focal abnormality visualized.  Right ovary  Measurements: 3.9 x 2.4 x 3.0 cm. Normal appearance/no adnexal mass.  Left ovary  Measurements: 2.6 x 1.6 x 1.4 cm. Normal appearance/no adnexal mass.  Other findings  Tiny amount of free fluid in cul-de-sac and right adnexa.  IMPRESSION: Normal appearance of retroverted uterus and ovaries. No pelvic mass or other significant abnormality visualized.   Electronically Signed   By: Myles RosenthalJohn  Stahl M.D.    On: 03/03/2014 19:52    MAU Course  Procedures None  MDM UPT - negative Quant hCG to confirm due to +HPT reported Wet prep and GC/Chlamydia today US ordered due to pain on bimanual exam When discussing results patient asked if normal US meant that she should be abel to conceive a pregnancy. I explained that further testing would be required to be more accurate and advised patient follow-up if desired.  Assessment and Plan  A: Bacterial vaginosis Negative pregnancy test  P: Discharge home Rx for Flagyl sent to patient's pharmacy Patient advised to take HPT if still no period in 1-2 weeks and follow-up accordingly Patient may return to MAU as needed or if her condition were to change or worsen  Freddi StarrJulie N Ethier, PA-C 03/03/2014, 4:36 PM

## 2014-03-03 NOTE — Discharge Instructions (Signed)
Bacterial Vaginosis Bacterial vaginosis is an infection of the vagina. It happens when too many of certain germs (bacteria) grow in the vagina. HOME CARE  Take your medicine as told by your doctor.  Finish your medicine even if you start to feel better.  Do not have sex until you finish your medicine and are better.  Tell your sex partner that you have an infection. They should see their doctor for treatment.  Practice safe sex. Use condoms. Have only one sex partner. GET HELP IF:  You are not getting better after 3 days of treatment.  You have more grey fluid (discharge) coming from your vagina than before.  You have more pain than before.  You have a fever. MAKE SURE YOU:   Understand these instructions.  Will watch your condition.  Will get help right away if you are not doing well or get worse. Document Released: 08/10/2008 Document Revised: 08/22/2013 Document Reviewed: 06/13/2013 [employer] Patient Information 2014 [employer].  

## 2014-03-04 LAB — GC/CHLAMYDIA PROBE AMP
CT Probe RNA: NEGATIVE
GC PROBE AMP APTIMA: NEGATIVE

## 2014-03-06 NOTE — MAU Provider Note (Signed)
Attestation of Attending Supervision of Advanced Practitioner (CNM/NP): Evaluation and management procedures were performed by the Advanced Practitioner under my supervision and collaboration.  I have reviewed the Advanced Practitioner's note and chart, and I agree with the management and plan.  Aniayah Alaniz Harraway-Smith 1:57 PM     

## 2014-09-16 ENCOUNTER — Encounter (HOSPITAL_COMMUNITY): Payer: Self-pay | Admitting: *Deleted

## 2014-10-14 ENCOUNTER — Inpatient Hospital Stay (HOSPITAL_COMMUNITY)
Admission: AD | Admit: 2014-10-14 | Discharge: 2014-10-14 | Disposition: A | Discharge status: Home / Self Care | Payer: Medicaid Other | Source: Ambulatory Visit | Attending: Obstetrics & Gynecology | Admitting: Obstetrics & Gynecology

## 2014-10-14 DIAGNOSIS — Z3A11 11 weeks gestation of pregnancy: Secondary | ICD-10-CM | POA: Diagnosis not present

## 2014-10-14 DIAGNOSIS — O210 Mild hyperemesis gravidarum: Secondary | ICD-10-CM | POA: Diagnosis not present

## 2014-10-14 DIAGNOSIS — Z87891 Personal history of nicotine dependence: Secondary | ICD-10-CM | POA: Insufficient documentation

## 2014-10-14 DIAGNOSIS — Z3490 Encounter for supervision of normal pregnancy, unspecified, unspecified trimester: Secondary | ICD-10-CM

## 2014-10-14 DIAGNOSIS — O219 Vomiting of pregnancy, unspecified: Secondary | ICD-10-CM | POA: Diagnosis present

## 2014-10-14 LAB — URINE MICROSCOPIC-ADD ON

## 2014-10-14 LAB — URINALYSIS, ROUTINE W REFLEX MICROSCOPIC
BILIRUBIN URINE: NEGATIVE
Glucose, UA: NEGATIVE mg/dL
HGB URINE DIPSTICK: NEGATIVE
KETONES UR: 15 mg/dL — AB
NITRITE: NEGATIVE
PH: 6 (ref 5.0–8.0)
Protein, ur: NEGATIVE mg/dL
Specific Gravity, Urine: 1.01 (ref 1.005–1.030)
Urobilinogen, UA: 0.2 mg/dL (ref 0.0–1.0)

## 2014-10-14 LAB — POCT PREGNANCY, URINE: Preg Test, Ur: POSITIVE — AB

## 2014-10-14 MED ORDER — PROMETHAZINE HCL 25 MG PO TABS: 25.0000 mg | ORAL_TABLET | Freq: Four times a day (QID) | ORAL | Status: DC | PRN

## 2014-10-14 MED ORDER — PROMETHAZINE HCL 25 MG/ML IJ SOLN
25.0000 mg | Freq: Once | INTRAMUSCULAR | Status: AC
  Administered 2014-10-14: 25 mg via INTRAMUSCULAR
  Filled 2014-10-14: qty 1

## 2014-10-14 MED ORDER — ONDANSETRON 4 MG PO TBDP: 4.0000 mg | ORAL_TABLET | Freq: Three times a day (TID) | ORAL | Status: DC | PRN

## 2014-10-14 NOTE — MAU Provider Note (Signed)
History     CSN: [id number]  Arrival date and time: 10/14/14 [id number]   First Provider Initiated Contact with Patient 10/14/14 1929      Chief Complaint  Patient presents with  . Possible Pregnancy  . Nausea  . Emesis   HPI Comments: Brittany Bowers 18 y.o. G1P0010  11 weeks and 5 days pregnant presents to MAU with nausea and vomiting x 3 days. She has lack of appetite but no weight loss. She has NOB visit scheduled for [facility] in December. No other complaints.   Possible Pregnancy Associated symptoms include nausea and vomiting.  Emesis       Past Medical History  Diagnosis Date  . Asthma     Past Surgical History  Procedure Laterality Date  . No past surgeries      No family history on file.  History  Substance Use Topics  . Smoking status: Former Smoker -- 0.50 packs/day    Quit date: 11/17/2012  . Smokeless tobacco: Never Used  . Alcohol Use: Yes     Comment: occational    Allergies:  Allergies  Allergen Reactions  . Onion Other (See Comments)    Upset stomach  . Pork-Derived Products Other (See Comments)    Religious preference    Prescriptions prior to admission  Medication Sig Dispense Refill Last Dose  . ferrous sulfate 325 (65 FE) MG tablet Take 325 mg by mouth daily.   Past Month at Unknown time  . Prenatal Vit-Fe Fumarate-FA (PRENATAL MULTIVITAMIN) TABS tablet Take 1 tablet by mouth daily at 12 noon.   10/13/2014 at Unknown time  . metroNIDAZOLE (FLAGYL) 500 MG tablet Take 1 tablet (500 mg total) by mouth 2 (two) times daily. (Patient not taking: Reported on 10/14/2014) 14 tablet 0     Review of Systems  Constitutional: Negative.   HENT: Negative.   Respiratory: Negative.   Cardiovascular: Negative.   Gastrointestinal: Positive for nausea and vomiting.       Anorexia  Genitourinary: Negative.   Musculoskeletal: Negative.   Skin: Negative.   Neurological: Negative.   Psychiatric/Behavioral: Negative.    Physical Exam   Blood pressure  125/68, pulse 84, temperature 98.2 F (36.8 C), temperature source Oral, resp. rate 16, height 5\' 2"  (1.575 m), weight 71.396 kg (157 lb 6.4 oz), last menstrual period 07/18/2014, SpO2 100 %.  Physical Exam  Constitutional: She is oriented to person, place, and time. She appears well-developed and well-nourished. No distress.  HENT:  Head: Normocephalic and atraumatic.  Eyes: Pupils are equal, round, and reactive to light.  Cardiovascular: Normal rate, regular rhythm and normal heart sounds.   Respiratory: Effort normal and breath sounds normal. No respiratory distress. She has no wheezes. She has no rales.  GI: Soft. Bowel sounds are normal. She exhibits no distension. There is no tenderness. There is no rebound and no guarding.  Genitourinary:  Not evaluated  Musculoskeletal: Normal range of motion.  Neurological: She is alert and oriented to person, place, and time.  Skin: Skin is warm and dry.  Psychiatric: She has a normal mood and affect. Her behavior is normal. Judgment and thought content normal.   Results for orders placed or performed during the hospital encounter of 10/14/14 (from the past 24 hour(s))  Urinalysis, Routine w reflex microscopic     Status: Abnormal   Collection Time: 10/14/14  7:08 PM  Result Value Ref Range   Color, Urine YELLOW YELLOW   APPearance CLEAR CLEAR   Specific Gravity, Urine  1.010 1.005 - 1.030   pH 6.0 5.0 - 8.0   Glucose, UA NEGATIVE NEGATIVE mg/dL   Hgb urine dipstick NEGATIVE NEGATIVE   Bilirubin Urine NEGATIVE NEGATIVE   Ketones, ur 15 (A) NEGATIVE mg/dL   Protein, ur NEGATIVE NEGATIVE mg/dL   Urobilinogen, UA 0.2 0.0 - 1.0 mg/dL   Nitrite NEGATIVE NEGATIVE   Leukocytes, UA TRACE (A) NEGATIVE  Urine microscopic-add on     Status: Abnormal   Collection Time: 10/14/14  7:08 PM  Result Value Ref Range   Squamous Epithelial / LPF RARE RARE   WBC, UA 0-2 <3 WBC/hpf   RBC / HPF 0-2 <3 RBC/hpf   Bacteria, UA FEW (A) RARE  Pregnancy, urine  POC     Status: Abnormal   Collection Time: 10/14/14  7:14 PM  Result Value Ref Range   Preg Test, Ur POSITIVE (A) NEGATIVE    MAU Course  Procedures  MDM Phenergan 25 mg IM  Pt requested Zofran for daytime use   Assessment and Plan   A: Nausea and vomiting in pregnancy  P: Phenergan 25 mg po q8 hours Zofran 4 mg po q8 hours for day time use Discussed diet choices Keep NOB visit with [facility]   Carolynn ServeBarefoot, Jearl Soto Miller 10/14/2014, 7:56 PM

## 2014-10-14 NOTE — Discharge Instructions (Signed)
First Trimester of Pregnancy The first trimester of pregnancy is from week 1 until the end of week 12 (months 1 through 3). A week after a sperm fertilizes an egg, the egg will implant on the wall of the uterus. This embryo will begin to develop into a baby. Genes from you and your partner are forming the baby. The female genes determine whether the baby is a boy or a girl. At 6-8 weeks, the eyes and face are formed, and the heartbeat can be seen on ultrasound. At the end of 12 weeks, all the baby's organs are formed.  Now that you are pregnant, you will want to do everything you can to have a healthy baby. Two of the most important things are to get good prenatal care and to follow your health care provider's instructions. Prenatal care is all the medical care you receive before the baby's birth. This care will help prevent, find, and treat any problems during the pregnancy and childbirth. BODY CHANGES Your body goes through many changes during pregnancy. The changes vary from woman to woman.   You may gain or lose a couple of pounds at first.  You may feel sick to your stomach (nauseous) and throw up (vomit). If the vomiting is uncontrollable, call your health care provider.  You may tire easily.  You may develop headaches that can be relieved by medicines approved by your health care provider.  You may urinate more often. Painful urination may mean you have a bladder infection.  You may develop heartburn as a result of your pregnancy.  You may develop constipation because certain hormones are causing the muscles that push waste through your intestines to slow down.  You may develop hemorrhoids or swollen, bulging veins (varicose veins).  Your breasts may begin to grow larger and become tender. Your nipples may stick out more, and the tissue that surrounds them (areola) may become darker.  Your gums may bleed and may be sensitive to brushing and flossing.  Dark spots or blotches (chloasma,  mask of pregnancy) may develop on your face. This will likely fade after the baby is born.  Your menstrual periods will stop.  You may have a loss of appetite.  You may develop cravings for certain kinds of food.  You may have changes in your emotions from day to day, such as being excited to be pregnant or being concerned that something may go wrong with the pregnancy and baby.  You may have more vivid and strange dreams.  You may have changes in your hair. These can include thickening of your hair, rapid growth, and changes in texture. Some women also have hair loss during or after pregnancy, or hair that feels dry or thin. Your hair will most likely return to normal after your baby is born. WHAT TO EXPECT AT YOUR PRENATAL VISITS During a routine prenatal visit:  You will be weighed to make sure you and the baby are growing normally.  Your blood pressure will be taken.  Your abdomen will be measured to track your baby's growth.  The fetal heartbeat will be listened to starting around week 10 or 12 of your pregnancy.  Test results from any previous visits will be discussed. Your health care provider may ask you:  How you are feeling.  If you are feeling the baby move.  If you have had any abnormal symptoms, such as leaking fluid, bleeding, severe headaches, or abdominal cramping.  If you have any questions. Other tests   that may be performed during your first trimester include:  Blood tests to find your blood type and to check for the presence of any previous infections. They will also be used to check for low iron levels (anemia) and Rh antibodies. Later in the pregnancy, blood tests for diabetes will be done along with other tests if problems develop.  Urine tests to check for infections, diabetes, or protein in the urine.  An ultrasound to confirm the proper growth and development of the baby.  An amniocentesis to check for possible genetic problems.  Fetal screens for  spina bifida and Down syndrome.  You may need other tests to make sure you and the baby are doing well. HOME CARE INSTRUCTIONS  Medicines  Follow your health care provider's instructions regarding medicine use. Specific medicines may be either safe or unsafe to take during pregnancy.  Take your prenatal vitamins as directed.  If you develop constipation, try taking a stool softener if your health care provider approves. Diet  Eat regular, well-balanced meals. Choose a variety of foods, such as meat or vegetable-based protein, fish, milk and low-fat dairy products, vegetables, fruits, and whole grain breads and cereals. Your health care provider will help you determine the amount of weight gain that is right for you.  Avoid raw meat and uncooked cheese. These carry germs that can cause birth defects in the baby.  Eating four or five small meals rather than three large meals a day may help relieve nausea and vomiting. If you start to feel nauseous, eating a few soda crackers can be helpful. Drinking liquids between meals instead of during meals also seems to help nausea and vomiting.  If you develop constipation, eat more high-fiber foods, such as fresh vegetables or fruit and whole grains. Drink enough fluids to keep your urine clear or pale yellow. Activity and Exercise  Exercise only as directed by your health care provider. Exercising will help you:  Control your weight.  Stay in shape.  Be prepared for labor and delivery.  Experiencing pain or cramping in the lower abdomen or low back is a good sign that you should stop exercising. Check with your health care provider before continuing normal exercises.  Try to avoid standing for long periods of time. Move your legs often if you must stand in one place for a long time.  Avoid heavy lifting.  Wear low-heeled shoes, and practice good posture.  You may continue to have sex unless your health care provider directs you  otherwise. Relief of Pain or Discomfort  Wear a good support bra for breast tenderness.   Take warm sitz baths to soothe any pain or discomfort caused by hemorrhoids. Use hemorrhoid cream if your health care provider approves.   Rest with your legs elevated if you have leg cramps or low back pain.  If you develop varicose veins in your legs, wear support hose. Elevate your feet for 15 minutes, 3-4 times a day. Limit salt in your diet. Prenatal Care  Schedule your prenatal visits by the twelfth week of pregnancy. They are usually scheduled monthly at first, then more often in the last 2 months before delivery.  Write down your questions. Take them to your prenatal visits.  Keep all your prenatal visits as directed by your health care provider. Safety  Wear your seat belt at all times when driving.  Make a list of emergency phone numbers, including numbers for family, friends, the hospital, and police and fire departments. General Tips    Ask your health care provider for a referral to a local prenatal education class. Begin classes no later than at the beginning of month 6 of your pregnancy.  Ask for help if you have counseling or nutritional needs during pregnancy. Your health care provider can offer advice or refer you to specialists for help with various needs.  Do not use hot tubs, steam rooms, or saunas.  Do not douche or use tampons or scented sanitary pads.  Do not cross your legs for long periods of time.  Avoid cat litter boxes and soil used by cats. These carry germs that can cause birth defects in the baby and possibly loss of the fetus by miscarriage or stillbirth.  Avoid all smoking, herbs, alcohol, and medicines not prescribed by your health care provider. Chemicals in these affect the formation and growth of the baby.  Schedule a dentist appointment. At home, brush your teeth with a soft toothbrush and be gentle when you floss. SEEK MEDICAL CARE IF:   You have  dizziness.  You have mild pelvic cramps, pelvic pressure, or nagging pain in the abdominal area.  You have persistent nausea, vomiting, or diarrhea.  You have a bad smelling vaginal discharge.  You have pain with urination.  You notice increased swelling in your face, hands, legs, or ankles. SEEK IMMEDIATE MEDICAL CARE IF:   You have a fever.  You are leaking fluid from your vagina.  You have spotting or bleeding from your vagina.  You have severe abdominal cramping or pain.  You have rapid weight gain or loss.  You vomit blood or material that looks like coffee grounds.  You are exposed to German measles and have never had them.  You are exposed to fifth disease or chickenpox.  You develop a severe headache.  You have shortness of breath.  You have any kind of trauma, such as from a fall or a car accident. Document Released: 10/26/2001 Document Revised: 03/18/2014 Document Reviewed: 09/11/2013 ExitCare Patient Information 2015 [employer]. This information is not intended to replace advice given to you by your health care provider. Make sure you discuss any questions you have with your health care provider.  Morning Sickness Morning sickness is when you feel sick to your stomach (nauseous) during pregnancy. This nauseous feeling may or may not come with vomiting. It often occurs in the morning but can be a problem any time of day. Morning sickness is most common during the first trimester, but it may continue throughout pregnancy. While morning sickness is unpleasant, it is usually harmless unless you develop severe and continual vomiting (hyperemesis gravidarum). This condition requires more intense treatment.  CAUSES  The cause of morning sickness is not completely known but seems to be related to normal hormonal changes that occur in pregnancy. RISK FACTORS You are at greater risk if you:  Experienced nausea or vomiting before your pregnancy.  Had morning  sickness during a previous pregnancy.  Are pregnant with more than one baby, such as twins. TREATMENT  Do not use any medicines (prescription, over-the-counter, or herbal) for morning sickness without first talking to your health care provider. Your health care provider may prescribe or recommend:  Vitamin B6 supplements.  Anti-nausea medicines.  The herbal medicine ginger. HOME CARE INSTRUCTIONS   Only take over-the-counter or prescription medicines as directed by your health care provider.  Taking multivitamins before getting pregnant can prevent or decrease the severity of morning sickness in most women.  Eat a piece of dry   toast or unsalted crackers before getting out of bed in the morning.  Eat five or six small meals a day.  Eat dry and bland foods (rice, baked potato). Foods high in carbohydrates are often helpful.  Do not drink liquids with your meals. Drink liquids between meals.  Avoid greasy, fatty, and spicy foods.  Get someone to cook for you if the smell of any food causes nausea and vomiting.  If you feel nauseous after taking prenatal vitamins, take the vitamins at night or with a snack.  Snack on protein foods (nuts, yogurt, cheese) between meals if you are hungry.  Eat unsweetened gelatins for desserts.  Wearing an acupressure wristband (worn for sea sickness) may be helpful.  Acupuncture may be helpful.  Do not smoke.  Get a humidifier to keep the air in your house free of odors.  Get plenty of fresh air. SEEK MEDICAL CARE IF:   Your home remedies are not working, and you need medicine.  You feel dizzy or lightheaded.  You are losing weight. SEEK IMMEDIATE MEDICAL CARE IF:   You have persistent and uncontrolled nausea and vomiting.  You pass out (faint). MAKE SURE YOU:  Understand these instructions.  Will watch your condition.  Will get help right away if you are not doing well or get worse. Document Released: 12/23/2006 Document  Revised: 11/06/2013 Document Reviewed: 04/18/2013 ExitCare Patient Information 2015 [employer]. This information is not intended to replace advice given to you by your health care provider. Make sure you discuss any questions you have with your health care provider.  

## 2014-10-14 NOTE — MAU Note (Signed)
Patient states [facility] States she has been nauseated and having vomiting. Denies bleeding and has a slight vaginal discharge. Denies pain except when she is hungry.

## 2014-10-14 NOTE — MAU Note (Signed)
Discussed good nutriitional food choices as well as good hydration practices with pt. Reviewed the use colace and or mirlax for constipation.

## 2014-11-11 ENCOUNTER — Other Ambulatory Visit (HOSPITAL_COMMUNITY): Payer: Self-pay | Admitting: Urology

## 2014-11-11 DIAGNOSIS — O3680X1 Pregnancy with inconclusive fetal viability, fetus 1: Secondary | ICD-10-CM

## 2014-11-11 DIAGNOSIS — IMO0002 Reserved for concepts with insufficient information to code with codable children: Secondary | ICD-10-CM

## 2014-11-11 LAB — OB RESULTS CONSOLE RPR: RPR: NONREACTIVE

## 2014-11-11 LAB — OB RESULTS CONSOLE GC/CHLAMYDIA
Chlamydia: NEGATIVE
Gonorrhea: NEGATIVE

## 2014-11-11 LAB — OB RESULTS CONSOLE HEPATITIS B SURFACE ANTIGEN: HEP B S AG: NEGATIVE

## 2014-11-11 LAB — OB RESULTS CONSOLE HIV ANTIBODY (ROUTINE TESTING): HIV: NONREACTIVE

## 2014-11-11 LAB — OB RESULTS CONSOLE RUBELLA ANTIBODY, IGM: RUBELLA: IMMUNE

## 2014-11-12 ENCOUNTER — Other Ambulatory Visit (HOSPITAL_COMMUNITY): Payer: Self-pay | Admitting: Urology

## 2014-11-12 ENCOUNTER — Ambulatory Visit (HOSPITAL_COMMUNITY)
Admission: RE | Admit: 2014-11-12 | Discharge: 2014-11-12 | Disposition: A | Discharge status: Home / Self Care | Payer: Medicaid Other | Source: Ambulatory Visit | Attending: Urology | Admitting: Urology

## 2014-11-12 DIAGNOSIS — IMO0002 Reserved for concepts with insufficient information to code with codable children: Secondary | ICD-10-CM

## 2014-11-12 DIAGNOSIS — O3680X1 Pregnancy with inconclusive fetal viability, fetus 1: Secondary | ICD-10-CM

## 2014-11-12 DIAGNOSIS — Z3A11 11 weeks gestation of pregnancy: Secondary | ICD-10-CM | POA: Diagnosis present

## 2014-11-12 DIAGNOSIS — Z36 Encounter for antenatal screening of mother: Secondary | ICD-10-CM | POA: Diagnosis present

## 2014-11-15 NOTE — L&D Delivery Note (Signed)
Delivery Note At 4:12 AM a viable female was delivered via  (Presentation: OA).  APGAR: 6, 9; weight 5 lb 14.5 oz (2679 g).   Placenta status: intact , spontaneous .  Cord:  3 vessel with the following complications: none.  Cord pH: n/a  Anesthesia:  epidural Episiotomy:  none Lacerations:  none Suture Repair: n/a Est. Blood Loss (mL):  350  Mom to postpartum.  Baby to Couplet care / Skin to Skin.  Erasmo DownerAngela M Bacigalupo, MD, MPH PGY-2,  [facility] 05/29/2015 4:55 AM    Patient is a G1 at 9365w6d who was admitted w/ SOL, uncomplicated prenatal course.  She progressed with augmentation via Pitocin.  I was gloved and present for delivery in its entirety.  Second stage of labor progressed, baby delivered after a brief period of pushing.  No decels during second stage noted.   Cam HaiSHAW, KIMBERLY, CNM 4:57 AM

## 2014-12-09 ENCOUNTER — Other Ambulatory Visit (HOSPITAL_COMMUNITY): Payer: Self-pay | Admitting: Nurse Practitioner

## 2014-12-09 DIAGNOSIS — Z3689 Encounter for other specified antenatal screening: Secondary | ICD-10-CM

## 2015-01-09 ENCOUNTER — Ambulatory Visit (HOSPITAL_COMMUNITY)
Admission: RE | Admit: 2015-01-09 | Discharge: 2015-01-09 | Disposition: A | Discharge status: Home / Self Care | Payer: Medicaid Other | Source: Ambulatory Visit | Attending: Nurse Practitioner | Admitting: Nurse Practitioner

## 2015-01-09 DIAGNOSIS — Z3A19 19 weeks gestation of pregnancy: Secondary | ICD-10-CM | POA: Insufficient documentation

## 2015-01-09 DIAGNOSIS — Z36 Encounter for antenatal screening of mother: Secondary | ICD-10-CM | POA: Insufficient documentation

## 2015-01-09 DIAGNOSIS — Z3689 Encounter for other specified antenatal screening: Secondary | ICD-10-CM

## 2015-01-12 ENCOUNTER — Inpatient Hospital Stay (HOSPITAL_COMMUNITY)
Admission: AD | Admit: 2015-01-12 | Discharge: 2015-01-12 | Disposition: A | Discharge status: Home / Self Care | Payer: Medicaid Other | Source: Ambulatory Visit | Attending: Obstetrics & Gynecology | Admitting: Obstetrics & Gynecology

## 2015-01-12 ENCOUNTER — Encounter (HOSPITAL_COMMUNITY): Payer: Self-pay | Admitting: *Deleted

## 2015-01-12 DIAGNOSIS — H9392 Unspecified disorder of left ear: Secondary | ICD-10-CM | POA: Insufficient documentation

## 2015-01-12 DIAGNOSIS — O9989 Other specified diseases and conditions complicating pregnancy, childbirth and the puerperium: Secondary | ICD-10-CM | POA: Diagnosis not present

## 2015-01-12 DIAGNOSIS — H9390 Unspecified disorder of ear, unspecified ear: Secondary | ICD-10-CM

## 2015-01-12 DIAGNOSIS — Z87891 Personal history of nicotine dependence: Secondary | ICD-10-CM | POA: Diagnosis not present

## 2015-01-12 DIAGNOSIS — Z3A20 20 weeks gestation of pregnancy: Secondary | ICD-10-CM | POA: Diagnosis not present

## 2015-01-12 DIAGNOSIS — H9202 Otalgia, left ear: Secondary | ICD-10-CM | POA: Insufficient documentation

## 2015-01-12 NOTE — MAU Note (Signed)
C/o progressive L ear pain for past 2 weeks;

## 2015-01-12 NOTE — MAU Note (Signed)
Pain is only in pt's L ear; appears to be a 2 pimples or boils just inside the pt's ear canal; states that she cleaned her ear with alcohol yesterday and noted yellowish pus;

## 2015-01-12 NOTE — MAU Provider Note (Signed)
History     CSN: [id number]  Arrival date and time: 01/12/15 2026   First Provider Initiated Contact with Patient 01/12/15 2104         Chief Complaint  Patient presents with  . Otalgia   HPI  Ms. Brittany Bowers is a 19 y.o. G2P0010 at 7557w2d here with report of left ear pain. Symptoms started 3 weeks ago. Reports left ear pain that radiates to left jaw. Rates as 10/10 on pain scale, ringing/achy pain. Has not treated. Had a "black head" type bump in ear canal that started a month ago and has increased in size since then. Yellow/green discharge from growth 2 days ago. Denies fever/cough/sore throat/headache.   Past Medical History  Diagnosis Date  . Asthma     Past Surgical History  Procedure Laterality Date  . No past surgeries      Family History  Problem Relation Age of Onset  . Alcohol abuse Neg Hx   . Arthritis Neg Hx   . Asthma Neg Hx   . Birth defects Neg Hx   . Cancer Neg Hx   . COPD Neg Hx   . Depression Neg Hx   . Diabetes Neg Hx   . Drug abuse Neg Hx   . Early death Neg Hx   . Hearing loss Neg Hx   . Heart disease Neg Hx   . Hyperlipidemia Neg Hx   . Hypertension Neg Hx   . Kidney disease Neg Hx   . Learning disabilities Neg Hx   . Mental illness Neg Hx   . Mental retardation Neg Hx   . Miscarriages / Stillbirths Neg Hx   . Stroke Neg Hx   . Vision loss Neg Hx   . Varicose Veins Neg Hx     History  Substance Use Topics  . Smoking status: Former Smoker -- 0.50 packs/day    Quit date: 11/17/2012  . Smokeless tobacco: Never Used  . Alcohol Use: Yes     Comment: occational    Allergies:  Allergies  Allergen Reactions  . Onion Other (See Comments)    Upset stomach  . Pork-Derived Products Other (See Comments)    Religious preference    Prescriptions prior to admission  Medication Sig Dispense Refill Last Dose  . ondansetron (ZOFRAN ODT) 4 MG disintegrating tablet Take 1 tablet (4 mg total) by mouth every 8 (eight) hours as needed for  nausea or vomiting. 20 tablet 0   . Prenatal Vit-Fe Fumarate-FA (PRENATAL MULTIVITAMIN) TABS tablet Take 1 tablet by mouth daily at 12 noon.   10/13/2014 at Unknown time  . promethazine (PHENERGAN) 25 MG tablet Take 1 tablet (25 mg total) by mouth every 6 (six) hours as needed for nausea or vomiting. 30 tablet 0     Review of Systems  Constitutional: Negative.   HENT: Positive for ear discharge, ear pain and tinnitus. Negative for congestion, hearing loss, nosebleeds and sore throat.   Respiratory: Negative.   Gastrointestinal: Negative.   Neurological: Negative for headaches.   Physical Exam   Blood pressure 124/74, pulse 98, temperature 98.5 F (36.9 C), resp. rate 16, height 5\' 3"  (1.6 m), weight 77.111 kg (170 lb), last menstrual period 07/18/2014.  Physical Exam  Nursing note and vitals reviewed. Constitutional: She appears well-developed and well-nourished. No distress.  HENT:  Head: Normocephalic.  Right Ear: Tympanic membrane, external ear and ear canal normal.  Left Ear: Tympanic membrane normal. There is swelling and tenderness. There is mastoid tenderness.  Ears:  Nose: Nose normal.  Mouth/Throat: Oropharynx is clear and moist.  Skin: She is not diaphoretic.    MAU Course  Procedures  MDM FHT 155 by doppler  2128 Consulted with Dr. Lynelle Doctor at [facility] > reviewed HPI/exam, lesion description/history > refer to ENT for possible removal, antibiotics not indicated at this time  Assessment and Plan  Left Ear Lesion  Plan:   Refer to ENT for evaluation Tylenol as indicated for pain Follow-up asap if fever develops  Claudie Revering, Student-NP 01/12/2015 9:23 PM   I examined pt and agree with documentation above and FNP-S plan of care.  Eino Farber Kennith Gain, CNM

## 2015-01-13 ENCOUNTER — Other Ambulatory Visit (HOSPITAL_COMMUNITY): Payer: Self-pay | Admitting: Nurse Practitioner

## 2015-01-13 DIAGNOSIS — Z3689 Encounter for other specified antenatal screening: Secondary | ICD-10-CM

## 2015-01-18 ENCOUNTER — Encounter (HOSPITAL_COMMUNITY): Payer: Self-pay | Admitting: *Deleted

## 2015-01-18 ENCOUNTER — Inpatient Hospital Stay (HOSPITAL_COMMUNITY)
Admission: AD | Admit: 2015-01-18 | Discharge: 2015-01-18 | Disposition: A | Discharge status: Home / Self Care | Payer: Medicaid Other | Source: Ambulatory Visit | Attending: Family Medicine | Admitting: Family Medicine

## 2015-01-18 DIAGNOSIS — Z3A21 21 weeks gestation of pregnancy: Secondary | ICD-10-CM | POA: Insufficient documentation

## 2015-01-18 DIAGNOSIS — O368120 Decreased fetal movements, second trimester, not applicable or unspecified: Secondary | ICD-10-CM | POA: Insufficient documentation

## 2015-01-18 DIAGNOSIS — O9989 Other specified diseases and conditions complicating pregnancy, childbirth and the puerperium: Secondary | ICD-10-CM

## 2015-01-18 DIAGNOSIS — N949 Unspecified condition associated with female genital organs and menstrual cycle: Secondary | ICD-10-CM

## 2015-01-18 DIAGNOSIS — Z3492 Encounter for supervision of normal pregnancy, unspecified, second trimester: Secondary | ICD-10-CM

## 2015-01-18 DIAGNOSIS — Z87891 Personal history of nicotine dependence: Secondary | ICD-10-CM | POA: Insufficient documentation

## 2015-01-18 NOTE — MAU Note (Signed)
Pt presents to MAU with complaints of a decrease in fetal movement today. Denies any vaginal bleeding or LOF. 

## 2015-01-18 NOTE — MAU Provider Note (Signed)
History     CSN: [id number]  Arrival date and time: 01/18/15 1020   None     Chief Complaint  Patient presents with  . Decreased Fetal Movement   HPI   Patient is 19 y.o. G2P0010 [redacted]w[redacted]d here with complaints of decreased fetal movement.  Pt reports feeling movement a few times per day for 2 weeks, but no movement now for 2 days.  She felt like movement abruptly ceased after an episode of diarrhea two days ago.  Additional symptoms of right sided lower abdominal pain which worsens with going from lying down to sitting or up stairs, no current pain, pain relieved by rest.  Denies loss of fluid, contractions.     Previous pregnancy problems include nausea and vomiting.  Previous MAU visit for ear pain for which she was prescribed medication she has not taken, but ear pain has resolved.    Past Medical History  Diagnosis Date  . Asthma     Past Surgical History  Procedure Laterality Date  . No past surgeries      Family History  Problem Relation Age of Onset  . Alcohol abuse Neg Hx   . Arthritis Neg Hx   . Asthma Neg Hx   . Birth defects Neg Hx   . Cancer Neg Hx   . COPD Neg Hx   . Depression Neg Hx   . Diabetes Neg Hx   . Drug abuse Neg Hx   . Early death Neg Hx   . Hearing loss Neg Hx   . Heart disease Neg Hx   . Hyperlipidemia Neg Hx   . Hypertension Neg Hx   . Kidney disease Neg Hx   . Learning disabilities Neg Hx   . Mental illness Neg Hx   . Mental retardation Neg Hx   . Miscarriages / Stillbirths Neg Hx   . Stroke Neg Hx   . Vision loss Neg Hx   . Varicose Veins Neg Hx     History  Substance Use Topics  . Smoking status: Former Smoker -- 0.50 packs/day    Quit date: 11/17/2012  . Smokeless tobacco: Never Used  . Alcohol Use: Yes     Comment: occational    Allergies:  Allergies  Allergen Reactions  . Onion Other (See Comments)    Upset stomach  . Pork-Derived Products Other (See Comments)    Religious preference    Prescriptions prior to  admission  Medication Sig Dispense Refill Last Dose  . acetaminophen (TYLENOL) 500 MG tablet Take 500 mg by mouth every 6 (six) hours as needed for moderate pain.   01/17/2015 at Unknown time  . Prenatal Vit-Fe Fumarate-FA (PRENATAL MULTIVITAMIN) TABS tablet Take 1 tablet by mouth daily at 12 noon.   01/17/2015 at Unknown time    Review of Systems  Constitutional: Negative for fever and chills.  Eyes: Negative for blurred vision and double vision.  Respiratory: Negative for cough and shortness of breath.   Cardiovascular: Negative for chest pain, palpitations and leg swelling.  Gastrointestinal: Positive for abdominal pain. Negative for nausea, vomiting and diarrhea.  Genitourinary: Negative for dysuria and hematuria.  Musculoskeletal: Negative for back pain.  Skin: Positive for rash.  Neurological: Negative for dizziness, loss of consciousness and headaches.   Physical Exam   Blood pressure 115/68, pulse 78, temperature 98.2 F (36.8 C), last menstrual period 07/18/2014.  Physical Exam  Constitutional: She is oriented to person, place, and time. She appears well-developed and well-nourished. No distress.  HENT:  Head: Normocephalic and atraumatic.  Mouth/Throat: Oropharynx is clear and moist.  Eyes: Conjunctivae and EOM are normal. Pupils are equal, round, and reactive to light.  Neck: Normal range of motion. Neck supple.  Cardiovascular: Normal rate, regular rhythm, normal heart sounds and intact distal pulses.   Respiratory: Effort normal and breath sounds normal. No respiratory distress.  GI: Soft. Bowel sounds are normal. She exhibits distension. There is no tenderness. There is no rebound and no guarding.  Musculoskeletal: Normal range of motion. She exhibits no edema.  Neurological: She is alert and oriented to person, place, and time. Coordination normal.  Skin: Skin is warm and dry. She is not diaphoretic.    MAU Course  Procedures  MDM Reassuring fetal doppler in  150s  Assessment and Plan   A: Patient is 19 y.o. G2P0010 1512w1d here with complaints of decreased fetal movement.       Doppler reassuring  P: Discussed starting kick counts at 28 wks; expectations      Discharge to home with return to hospital indications  Henson,Amber 01/18/2015, 11:58 AM   OB fellow attestation:  I have seen and examined this patient; I agree with above documentation in the resident's note.   Caren HazySheneka M Nack is a 19 y.o. G2P0010 reporting no fetal movement since Thursday (x3d), noted fetal movement once arrived to MAU denies LOF, VB, contractions, vaginal discharge.  PE: BP 124/79 mmHg  Pulse 80  Temp(Src) 98.2 F (36.8 C)  Resp 16  LMP 07/18/2014 Gen: calm comfortable, NAD Resp: normal effort, no distress Abd: gravid  ROS, labs, PMH reviewed FHT 150s  Plan: - discussed normal fetal movement, at this time too early for BPP or NST, FHT within normal limits.  Perry MountACOSTA,Aundria Bitterman ROCIO, MD 2:45 AM

## 2015-02-06 ENCOUNTER — Ambulatory Visit (HOSPITAL_COMMUNITY)
Admission: RE | Admit: 2015-02-06 | Discharge: 2015-02-06 | Disposition: A | Discharge status: Home / Self Care | Payer: Medicaid Other | Source: Ambulatory Visit | Attending: Nurse Practitioner | Admitting: Nurse Practitioner

## 2015-02-06 DIAGNOSIS — Z36 Encounter for antenatal screening of mother: Secondary | ICD-10-CM | POA: Insufficient documentation

## 2015-02-06 DIAGNOSIS — Z3689 Encounter for other specified antenatal screening: Secondary | ICD-10-CM

## 2015-02-06 DIAGNOSIS — IMO0002 Reserved for concepts with insufficient information to code with codable children: Secondary | ICD-10-CM | POA: Insufficient documentation

## 2015-02-06 DIAGNOSIS — Z0489 Encounter for examination and observation for other specified reasons: Secondary | ICD-10-CM | POA: Insufficient documentation

## 2015-02-06 DIAGNOSIS — Z3A23 23 weeks gestation of pregnancy: Secondary | ICD-10-CM | POA: Insufficient documentation

## 2015-05-02 ENCOUNTER — Encounter (HOSPITAL_COMMUNITY): Payer: Self-pay

## 2015-05-02 ENCOUNTER — Inpatient Hospital Stay (HOSPITAL_COMMUNITY)
Admission: AD | Admit: 2015-05-02 | Discharge: 2015-05-02 | Disposition: A | Discharge status: Home / Self Care | Payer: Medicaid Other | Source: Ambulatory Visit | Attending: Obstetrics & Gynecology | Admitting: Obstetrics & Gynecology

## 2015-05-02 DIAGNOSIS — Z3493 Encounter for supervision of normal pregnancy, unspecified, third trimester: Secondary | ICD-10-CM | POA: Insufficient documentation

## 2015-05-02 DIAGNOSIS — Z3A36 36 weeks gestation of pregnancy: Secondary | ICD-10-CM | POA: Insufficient documentation

## 2015-05-02 NOTE — MAU Note (Signed)
Pt here with c/o contractions since about 0100, denies any bleeding or leaking of fluid. Reports positive fetal movement. Was checked in the office on 06/06 was 0.5 cm

## 2015-05-08 LAB — OB RESULTS CONSOLE RPR: RPR: NONREACTIVE

## 2015-05-08 LAB — OB RESULTS CONSOLE GC/CHLAMYDIA
Chlamydia: NEGATIVE
Gonorrhea: NEGATIVE

## 2015-05-08 LAB — OB RESULTS CONSOLE GBS: GBS: NEGATIVE

## 2015-05-28 ENCOUNTER — Inpatient Hospital Stay (HOSPITAL_COMMUNITY): Payer: Medicaid Other | Admitting: Anesthesiology

## 2015-05-28 ENCOUNTER — Encounter (HOSPITAL_COMMUNITY): Payer: Self-pay | Admitting: *Deleted

## 2015-05-28 ENCOUNTER — Inpatient Hospital Stay (HOSPITAL_COMMUNITY)
Admission: AD | Admit: 2015-05-28 | Discharge: 2015-05-30 | DRG: 775 | Disposition: A | Discharge status: Home / Self Care | Payer: Medicaid Other | Source: Ambulatory Visit | Attending: Obstetrics and Gynecology | Admitting: Obstetrics and Gynecology

## 2015-05-28 DIAGNOSIS — Z3403 Encounter for supervision of normal first pregnancy, third trimester: Secondary | ICD-10-CM | POA: Diagnosis present

## 2015-05-28 DIAGNOSIS — Z3A39 39 weeks gestation of pregnancy: Secondary | ICD-10-CM | POA: Diagnosis present

## 2015-05-28 LAB — RAPID URINE DRUG SCREEN, HOSP PERFORMED
AMPHETAMINES: NOT DETECTED
BENZODIAZEPINES: NOT DETECTED
Barbiturates: NOT DETECTED
Cocaine: NOT DETECTED
Opiates: NOT DETECTED
Tetrahydrocannabinol: NOT DETECTED

## 2015-05-28 LAB — CBC
HCT: 35.3 % — ABNORMAL LOW (ref 36.0–46.0)
Hemoglobin: 11.4 g/dL — ABNORMAL LOW (ref 12.0–15.0)
MCH: 26.9 pg (ref 26.0–34.0)
MCHC: 32.3 g/dL (ref 30.0–36.0)
MCV: 83.3 fL (ref 78.0–100.0)
Platelets: 276 10*3/uL (ref 150–400)
RBC: 4.24 MIL/uL (ref 3.87–5.11)
RDW: 15.6 % — ABNORMAL HIGH (ref 11.5–15.5)
WBC: 14.7 10*3/uL — ABNORMAL HIGH (ref 4.0–10.5)

## 2015-05-28 LAB — URINALYSIS, ROUTINE W REFLEX MICROSCOPIC
BILIRUBIN URINE: NEGATIVE
GLUCOSE, UA: 100 mg/dL — AB
Hgb urine dipstick: NEGATIVE
KETONES UR: NEGATIVE mg/dL
Leukocytes, UA: NEGATIVE
Nitrite: NEGATIVE
Protein, ur: NEGATIVE mg/dL
Specific Gravity, Urine: 1.015 (ref 1.005–1.030)
UROBILINOGEN UA: 0.2 mg/dL (ref 0.0–1.0)
pH: 7 (ref 5.0–8.0)

## 2015-05-28 LAB — TYPE AND SCREEN
ABO/RH(D): O POS
Antibody Screen: NEGATIVE

## 2015-05-28 MED ORDER — FENTANYL CITRATE (PF) 100 MCG/2ML IJ SOLN: 50.0000 ug | INTRAMUSCULAR | Status: DC | PRN

## 2015-05-28 MED ORDER — DIPHENHYDRAMINE HCL 50 MG/ML IJ SOLN
12.5000 mg | INTRAMUSCULAR | Status: DC | PRN
  Filled 2015-05-28 (×2): qty 1

## 2015-05-28 MED ORDER — OXYTOCIN 40 UNITS IN LACTATED RINGERS INFUSION - SIMPLE MED
1.0000 m[IU]/min | INTRAVENOUS | Status: DC
  Administered 2015-05-29: 4 m[IU]/min via INTRAVENOUS

## 2015-05-28 MED ORDER — LIDOCAINE HCL (PF) 1 % IJ SOLN
30.0000 mL | INTRAMUSCULAR | Status: DC | PRN
  Filled 2015-05-28: qty 30

## 2015-05-28 MED ORDER — LACTATED RINGERS IV SOLN
INTRAVENOUS | Status: DC
  Administered 2015-05-28 – 2015-05-29 (×2): via INTRAVENOUS
  Administered 2015-05-29: 500 mL via INTRAVENOUS

## 2015-05-28 MED ORDER — EPHEDRINE 5 MG/ML INJ
10.0000 mg | INTRAVENOUS | Status: DC | PRN
  Filled 2015-05-28: qty 2

## 2015-05-28 MED ORDER — TERBUTALINE SULFATE 1 MG/ML IJ SOLN: 0.2500 mg | Freq: Once | INTRAMUSCULAR | Status: AC | PRN

## 2015-05-28 MED ORDER — OXYCODONE-ACETAMINOPHEN 5-325 MG PO TABS: 2.0000 | ORAL_TABLET | ORAL | Status: DC | PRN

## 2015-05-28 MED ORDER — NALBUPHINE HCL 10 MG/ML IJ SOLN
10.0000 mg | Freq: Once | INTRAMUSCULAR | Status: AC
  Administered 2015-05-28: 10 mg via INTRAVENOUS
  Filled 2015-05-28: qty 1

## 2015-05-28 MED ORDER — OXYTOCIN 40 UNITS IN LACTATED RINGERS INFUSION - SIMPLE MED
1.0000 m[IU]/min | INTRAVENOUS | Status: DC
  Administered 2015-05-28: 2 m[IU]/min via INTRAVENOUS

## 2015-05-28 MED ORDER — ACETAMINOPHEN 325 MG PO TABS: 650.0000 mg | ORAL_TABLET | ORAL | Status: DC | PRN

## 2015-05-28 MED ORDER — LIDOCAINE HCL (PF) 1 % IJ SOLN
INTRAMUSCULAR | Status: DC | PRN
  Administered 2015-05-28 (×2): 4 mL

## 2015-05-28 MED ORDER — PHENYLEPHRINE 40 MCG/ML (10ML) SYRINGE FOR IV PUSH (FOR BLOOD PRESSURE SUPPORT)
80.0000 ug | PREFILLED_SYRINGE | INTRAVENOUS | Status: DC | PRN
  Filled 2015-05-28: qty 2

## 2015-05-28 MED ORDER — FENTANYL 2.5 MCG/ML BUPIVACAINE 1/10 % EPIDURAL INFUSION (WH - ANES): 14.0000 mL/h | INTRAMUSCULAR | Status: DC | PRN

## 2015-05-28 MED ORDER — OXYTOCIN BOLUS FROM INFUSION
500.0000 mL | INTRAVENOUS | Status: DC
  Administered 2015-05-29: 500 mL via INTRAVENOUS

## 2015-05-28 MED ORDER — OXYCODONE-ACETAMINOPHEN 5-325 MG PO TABS
1.0000 | ORAL_TABLET | ORAL | Status: DC | PRN
  Administered 2015-05-29: 1 via ORAL
  Filled 2015-05-28: qty 1

## 2015-05-28 MED ORDER — LACTATED RINGERS IV SOLN
INTRAVENOUS | Status: DC
  Administered 2015-05-28: 12:00:00 via INTRAVENOUS

## 2015-05-28 MED ORDER — FENTANYL 2.5 MCG/ML BUPIVACAINE 1/10 % EPIDURAL INFUSION (WH - ANES)
14.0000 mL/h | INTRAMUSCULAR | Status: DC | PRN
  Administered 2015-05-28 (×2): 14 mL/h via EPIDURAL
  Filled 2015-05-28: qty 125

## 2015-05-28 MED ORDER — PHENYLEPHRINE 40 MCG/ML (10ML) SYRINGE FOR IV PUSH (FOR BLOOD PRESSURE SUPPORT)
80.0000 ug | PREFILLED_SYRINGE | INTRAVENOUS | Status: DC | PRN
  Filled 2015-05-28: qty 2
  Filled 2015-05-28: qty 20

## 2015-05-28 MED ORDER — ONDANSETRON HCL 4 MG/2ML IJ SOLN: 4.0000 mg | Freq: Four times a day (QID) | INTRAMUSCULAR | Status: DC | PRN

## 2015-05-28 MED ORDER — OXYTOCIN 40 UNITS IN LACTATED RINGERS INFUSION - SIMPLE MED
62.5000 mL/h | INTRAVENOUS | Status: DC
  Administered 2015-05-29: 62.5 mL/h via INTRAVENOUS
  Filled 2015-05-28: qty 1000

## 2015-05-28 MED ORDER — FENTANYL CITRATE (PF) 100 MCG/2ML IJ SOLN
100.0000 ug | Freq: Once | INTRAMUSCULAR | Status: AC
  Administered 2015-05-28: 100 ug via INTRAVENOUS
  Filled 2015-05-28: qty 2

## 2015-05-28 MED ORDER — LACTATED RINGERS IV SOLN
500.0000 mL | INTRAVENOUS | Status: DC | PRN
  Administered 2015-05-28: 500 mL via INTRAVENOUS

## 2015-05-28 MED ORDER — PROMETHAZINE HCL 25 MG/ML IJ SOLN
12.5000 mg | Freq: Once | INTRAMUSCULAR | Status: AC
  Administered 2015-05-28: 12.5 mg via INTRAVENOUS
  Filled 2015-05-28: qty 1

## 2015-05-28 MED ORDER — DIPHENHYDRAMINE HCL 50 MG/ML IJ SOLN
12.5000 mg | INTRAMUSCULAR | Status: DC | PRN
  Administered 2015-05-28 – 2015-05-29 (×2): 12.5 mg via INTRAVENOUS

## 2015-05-28 MED ORDER — CITRIC ACID-SODIUM CITRATE 334-500 MG/5ML PO SOLN: 30.0000 mL | ORAL | Status: DC | PRN

## 2015-05-28 NOTE — Anesthesia Preprocedure Evaluation (Signed)
Anesthesia Evaluation  Patient identified by MRN, date of birth, ID band Patient awake    Reviewed: Allergy & Precautions, Patient's Chart, lab work & pertinent test results  Airway Mallampati: III  TM Distance: >3 FB Neck ROM: Full    Dental no notable dental hx. (+) Teeth Intact   Pulmonary asthma , former smoker,  breath sounds clear to auscultation  Pulmonary exam normal       Cardiovascular negative cardio ROS Normal cardiovascular examRhythm:Regular Rate:Normal     Neuro/Psych negative neurological ROS  negative psych ROS   GI/Hepatic Neg liver ROS, GERD-  ,  Endo/Other  Obesity  Renal/GU negative Renal ROS  negative genitourinary   Musculoskeletal negative musculoskeletal ROS (+)   Abdominal (+) + obese,   Peds  Hematology  (+) anemia ,   Anesthesia Other Findings   Reproductive/Obstetrics (+) Pregnancy                             Anesthesia Physical Anesthesia Plan  ASA: II  Anesthesia Plan: Epidural   Post-op Pain Management:    Induction:   Airway Management Planned: Natural Airway  Additional Equipment:   Intra-op Plan:   Post-operative Plan:   Informed Consent: I have reviewed the patients History and Physical, chart, labs and discussed the procedure including the risks, benefits and alternatives for the proposed anesthesia with the patient or authorized representative who has indicated his/her understanding and acceptance.     Plan Discussed with: Anesthesiologist  Anesthesia Plan Comments:         Anesthesia Quick Evaluation

## 2015-05-28 NOTE — MAU Note (Signed)
Patient presents at 7039 weeks gestation with c/o contractions 20 minutes apart since 0400 today. Fetus active. Denies bleeding or discharge.

## 2015-05-28 NOTE — Progress Notes (Signed)
Brittany Bowers is a 19 y.o. G1P0000 at 4663w5d  admitted for active labor, latent phase  Subjective:  Now 6 hr since admit, epidural in place. FHR cat I Objective: BP 108/65 mmHg  Pulse 114  Temp(Src) 97.7 F (36.5 C) (Oral)  Resp 18  Ht 5\' 4"  (1.626 m)  Wt 200 lb 2 oz (90.776 kg)  BMI 34.33 kg/m2  SpO2 98%  LMP 07/18/2014      FHT:  FHR: 145 bpm, variability: moderate,  accelerations:  Present,  decelerations:  Absent UC:   irregular, every 4-7 minutes SVE:   Dilation: 5 Effacement (%): 90 Station: -2 Exam by:: Dr Ferguesonstretched from 3.5 cm. BOW intact  Labs: Lab Results  Component Value Date   WBC 14.7* 05/28/2015   HGB 11.4* 05/28/2015   HCT 35.3* 05/28/2015   MCV 83.3 05/28/2015   PLT 276 05/28/2015    Assessment / Plan: Protracted latent phase  Labor: will augment with pitocin Preeclampsia:   Fetal Wellbeing:  Category I Pain Control:  Epidural I/D:  n/a Anticipated MOD:  NSVD  Brittany Bowers 05/28/2015, 10:38 PM

## 2015-05-28 NOTE — Anesthesia Procedure Notes (Signed)
Epidural Patient location during procedure: OB Start time: 05/28/2015 8:44 PM  Staffing Anesthesiologist: Mal AmabileFOSTER, Kacia Halley Performed by: anesthesiologist   Preanesthetic Checklist Completed: patient identified, site marked, surgical consent, pre-op evaluation, timeout performed, IV checked, risks and benefits discussed and monitors and equipment checked  Epidural Patient position: sitting Prep: site prepped and draped and DuraPrep Patient monitoring: continuous pulse ox and blood pressure Approach: midline Location: L4-L5 Injection technique: LOR air  Needle:  Needle type: Tuohy  Needle gauge: 17 G Needle length: 9 cm and 9 Needle insertion depth: 9 cm Catheter type: closed end flexible Catheter size: 19 Gauge Catheter at skin depth: 15 cm Test dose: negative and Other  Assessment Events: blood not aspirated, injection not painful, no injection resistance, negative IV test and no paresthesia  Additional Notes Patient identified. Risks and benefits discussed including failed block, incomplete  Pain control, post dural puncture headache, nerve damage, paralysis, blood pressure Changes, nausea, vomiting, reactions to medications-both toxic and allergic and post Partum back pain. All questions were answered. Patient expressed understanding and wished to proceed. Sterile technique was used throughout procedure. Epidural site was Dressed with sterile barrier dressing. No paresthesias, signs of intravascular injection Or signs of intrathecal spread were encountered. Difficult due to extremely poor position and MO. Attempt x 5 Patient was more comfortable after the epidural was dosed. Please see RN's note for documentation of vital signs and FHR which are stable.

## 2015-05-28 NOTE — H&P (Signed)
LABOR ADMISSION HISTORY AND PHYSICAL  Brittany Bowers is a 19 y.o. female G1P0000 with IUP at 6631w5d by 11 week sono presenting for SOL. She reports +FMs, No LOF, no VB, no blurry vision, headaches or peripheral edema, and RUQ pain.  She plans on breast & bottle feeding. She request unknown for birth control.  Dating: By 11 wk sono --->  Estimated Date of Delivery: 05/30/15  Sono:    @[redacted]w[redacted]d , CWD, normal anatomy, 533g, 28% EFW   Prenatal History/Complications:  Past Medical History: Past Medical History  Diagnosis Date  . Asthma     uses inhaler twice/day    Past Surgical History: Past Surgical History  Procedure Laterality Date  . No past surgeries      Obstetrical History: OB History    Gravida Para Term Preterm AB TAB SAB Ectopic Multiple Living   1 0   0  0         Social History: History   Social History  . Marital Status: Single    Spouse Name: N/A  . Number of Children: N/A  . Years of Education: N/A   Social History Main Topics  . Smoking status: Former Smoker -- 0.50 packs/day    Quit date: 11/17/2012  . Smokeless tobacco: Never Used  . Alcohol Use: Yes     Comment: occational  . Drug Use: No  . Sexual Activity: Yes    Birth Control/ Protection: None   Other Topics Concern  . None   Social History Narrative    Family History: Family History  Problem Relation Age of Onset  . Alcohol abuse Neg Hx   . Arthritis Neg Hx   . Asthma Neg Hx   . Birth defects Neg Hx   . COPD Neg Hx   . Depression Neg Hx   . Drug abuse Neg Hx   . Early death Neg Hx   . Hearing loss Neg Hx   . Heart disease Neg Hx   . Hyperlipidemia Neg Hx   . Hypertension Neg Hx   . Kidney disease Neg Hx   . Learning disabilities Neg Hx   . Mental illness Neg Hx   . Mental retardation Neg Hx   . Miscarriages / Stillbirths Neg Hx   . Vision loss Neg Hx   . Varicose Veins Neg Hx   . Diabetes Father   . Cancer Maternal Aunt   . Stroke Maternal Aunt   . Diabetes Maternal  Grandmother   . Stroke Maternal Grandmother     TIA  . Cancer Maternal Grandfather     Allergies: Allergies  Allergen Reactions  . Onion Other (See Comments)    Upset stomach  . Pork-Derived Products Other (See Comments)    Religious preference    Prescriptions prior to admission  Medication Sig Dispense Refill Last Dose  . beclomethasone (QVAR) 40 MCG/ACT inhaler Inhale 2 puffs into the lungs 2 (two) times daily.   05/27/2015 at Unknown time  . Prenatal Vit-Fe Fumarate-FA (PRENATAL MULTIVITAMIN) TABS tablet Take 1 tablet by mouth daily at 12 noon.   Past Month at Unknown time     Review of Systems   All systems reviewed and negative except as stated in HPI  Blood pressure 123/75, pulse 102, temperature 98 F (36.7 C), temperature source Oral, resp. rate 18, height 5\' 4"  (1.626 m), weight 200 lb 2 oz (90.776 kg), last menstrual period 07/18/2014. General appearance: alert and cooperative Lungs: clear to auscultation bilaterally Heart: regular rate  and rhythm Abdomen: soft, non-tender; bowel sounds normal Extremities: Homans sign is negative, no sign of DVT Presentation: cephalic Fetal monitoringBaseline: 150 bpm, Variability: Good {> 6 bpm), Accelerations: Reactive and Decelerations: Variable: mild Uterine activityirregular every 2-7 min  Dilation: 4.5 Effacement (%): 70 (stretchy) Station: Ballotable Exam by:: jolynn   Prenatal labs: ABO, Rh:  O Pos Antibody:  Neg Rubella:  Immune RPR:   Neg HBsAg:   Neg HIV:   Nonreactive GBS:   Negative  1 hr Glucola 118 Genetic screening  Negative  Anatomy US normal   Prenatal Transfer Tool  Maternal Diabetes: No Genetic Screening: Normal Maternal Ultrasounds/Referrals: Normal Fetal Ultrasounds or other Referrals:  None Maternal Substance Abuse:  Yes:  Type: Marijuana Significant Maternal Medications:  None Significant Maternal Lab Results: Lab values include: Group B Strep negative  Results for orders placed or  performed during the hospital encounter of 05/28/15 (from the past 24 hour(s))  Urinalysis, Routine w reflex microscopic (not at [facility])   Collection Time: 05/28/15 10:28 AM  Result Value Ref Range   Color, Urine YELLOW YELLOW   APPearance CLEAR CLEAR   Specific Gravity, Urine 1.015 1.005 - 1.030   pH 7.0 5.0 - 8.0   Glucose, UA 100 (A) NEGATIVE mg/dL   Hgb urine dipstick NEGATIVE NEGATIVE   Bilirubin Urine NEGATIVE NEGATIVE   Ketones, ur NEGATIVE NEGATIVE mg/dL   Protein, ur NEGATIVE NEGATIVE mg/dL   Urobilinogen, UA 0.2 0.0 - 1.0 mg/dL   Nitrite NEGATIVE NEGATIVE   Leukocytes, UA NEGATIVE NEGATIVE  CBC   Collection Time: 05/28/15 11:50 AM  Result Value Ref Range   WBC 14.7 (H) 4.0 - 10.5 K/uL   RBC 4.24 3.87 - 5.11 MIL/uL   Hemoglobin 11.4 (L) 12.0 - 15.0 g/dL   HCT 16.1 (L) 09.6 - 04.5 %   MCV 83.3 78.0 - 100.0 fL   MCH 26.9 26.0 - 34.0 pg   MCHC 32.3 30.0 - 36.0 g/dL   RDW 40.9 (H) 81.1 - 91.4 %   Platelets 276 150 - 400 K/uL    Patient Active Problem List   Diagnosis Date Noted  . Indication for care or intervention in labor or delivery 05/28/2015  . Evaluate anatomy not seen on prior sonogram   . [redacted] weeks gestation of pregnancy   . Encounter for fetal anatomic survey   . [redacted] weeks gestation of pregnancy   . Pregnant 10/14/2014  . Nausea and vomiting in pregnancy 10/14/2014    Assessment: Brittany Bowers is a 19 y.o. G1P0000 at [redacted]w[redacted]d here for SOL.   #Labor:will augment with pitocin #Pain: Epidural upon request #FWB: Cat I #ID:  GBS negative #MOF: breast & bottle #MOC:unknown   De Hollingshead 05/28/2015, 4:48 PM

## 2015-05-28 NOTE — MAU Note (Signed)
Ongoing ctx's lots of pressure

## 2015-05-29 ENCOUNTER — Encounter (HOSPITAL_COMMUNITY): Payer: Self-pay

## 2015-05-29 DIAGNOSIS — Z3A39 39 weeks gestation of pregnancy: Secondary | ICD-10-CM

## 2015-05-29 LAB — RPR: RPR: NONREACTIVE

## 2015-05-29 MED ORDER — OXYCODONE-ACETAMINOPHEN 5-325 MG PO TABS
1.0000 | ORAL_TABLET | ORAL | Status: DC | PRN
  Administered 2015-05-29: 1 via ORAL
  Filled 2015-05-29: qty 1

## 2015-05-29 MED ORDER — ONDANSETRON HCL 4 MG/2ML IJ SOLN: 4.0000 mg | INTRAMUSCULAR | Status: DC | PRN

## 2015-05-29 MED ORDER — DIBUCAINE 1 % RE OINT: 1.0000 "application " | TOPICAL_OINTMENT | RECTAL | Status: DC | PRN

## 2015-05-29 MED ORDER — TETANUS-DIPHTH-ACELL PERTUSSIS 5-2.5-18.5 LF-MCG/0.5 IM SUSP: 0.5000 mL | Freq: Once | INTRAMUSCULAR | Status: DC

## 2015-05-29 MED ORDER — SIMETHICONE 80 MG PO CHEW: 80.0000 mg | CHEWABLE_TABLET | ORAL | Status: DC | PRN

## 2015-05-29 MED ORDER — IBUPROFEN 600 MG PO TABS
600.0000 mg | ORAL_TABLET | Freq: Four times a day (QID) | ORAL | Status: DC
  Administered 2015-05-29 – 2015-05-30 (×6): 600 mg via ORAL
  Filled 2015-05-29 (×6): qty 1

## 2015-05-29 MED ORDER — ACETAMINOPHEN 325 MG PO TABS: 650.0000 mg | ORAL_TABLET | ORAL | Status: DC | PRN

## 2015-05-29 MED ORDER — BENZOCAINE-MENTHOL 20-0.5 % EX AERO
1.0000 "application " | INHALATION_SPRAY | CUTANEOUS | Status: DC | PRN
  Administered 2015-05-29: 1 via TOPICAL
  Filled 2015-05-29 (×2): qty 56

## 2015-05-29 MED ORDER — ZOLPIDEM TARTRATE 5 MG PO TABS: 5.0000 mg | ORAL_TABLET | Freq: Every evening | ORAL | Status: DC | PRN

## 2015-05-29 MED ORDER — SENNOSIDES-DOCUSATE SODIUM 8.6-50 MG PO TABS
2.0000 | ORAL_TABLET | ORAL | Status: DC
  Administered 2015-05-29: 2 via ORAL
  Filled 2015-05-29: qty 2

## 2015-05-29 MED ORDER — OXYCODONE-ACETAMINOPHEN 5-325 MG PO TABS
2.0000 | ORAL_TABLET | ORAL | Status: DC | PRN
  Administered 2015-05-29 – 2015-05-30 (×2): 2 via ORAL
  Filled 2015-05-29 (×3): qty 2

## 2015-05-29 MED ORDER — LANOLIN HYDROUS EX OINT: TOPICAL_OINTMENT | CUTANEOUS | Status: DC | PRN

## 2015-05-29 MED ORDER — ONDANSETRON HCL 4 MG PO TABS: 4.0000 mg | ORAL_TABLET | ORAL | Status: DC | PRN

## 2015-05-29 MED ORDER — PRENATAL MULTIVITAMIN CH
1.0000 | ORAL_TABLET | Freq: Every day | ORAL | Status: DC
Start: 2015-05-29 — End: 2015-05-30
  Administered 2015-05-29 – 2015-05-30 (×2): 1 via ORAL
  Filled 2015-05-29 (×2): qty 1

## 2015-05-29 MED ORDER — WITCH HAZEL-GLYCERIN EX PADS: 1.0000 "application " | MEDICATED_PAD | CUTANEOUS | Status: DC | PRN

## 2015-05-29 MED ORDER — PNEUMOCOCCAL VAC POLYVALENT 25 MCG/0.5ML IJ INJ
0.5000 mL | INJECTION | INTRAMUSCULAR | Status: DC
  Filled 2015-05-29: qty 0.5

## 2015-05-29 MED ORDER — DIPHENHYDRAMINE HCL 25 MG PO CAPS
25.0000 mg | ORAL_CAPSULE | Freq: Four times a day (QID) | ORAL | Status: DC | PRN
Start: 2015-05-29 — End: 2015-05-30

## 2015-05-29 NOTE — Progress Notes (Signed)
Brittany Bowers is a [age] y.o. G1P0000 at 3133w6d   Subjective: Epid in place but still having some pelvic pressure; feeling fluid leak  Objective: BP 119/72 mmHg  Pulse 100  Temp(Src) 98.9 F (37.2 C) (Oral)  Resp 18  Ht 5\' 4"  (1.626 m)  Wt 90.776 kg (200 lb 2 oz)  BMI 34.33 kg/m2  SpO2 98%  LMP 07/18/2014   Total I/O In: -  Out: 675 [Urine:675]  FHT:  FHR: 135-145 bpm, variability: moderate,  accelerations:  Present,  decelerations:  Absent- occ mi variables UC:   regular, every 4 minutes SVE:   Dilation: 8.5 Effacement (%): 100 Station: 0 Exam by:: Brittany Bowers, CNM - copious clear fluid  Labs: Lab Results  Component Value Date   WBC 14.7* 05/28/2015   HGB 11.4* 05/28/2015   HCT 35.3* 05/28/2015   MCV 83.3 05/28/2015   PLT 276 05/28/2015    Assessment / Plan: Active labor  Plan to check cx in 2 hours or sooner prn Epid PCA for comfort  Cam HaiSHAW, KIMBERLY CNM 05/29/2015, 3:33 AM

## 2015-05-29 NOTE — Anesthesia Postprocedure Evaluation (Signed)
  Anesthesia Post-op Note  Patient: Brittany Bowers  Procedure(s) Performed: * No procedures listed *  Patient Location: Mother/Baby  Anesthesia Type:Epidural  Level of Consciousness: awake  Airway and Oxygen Therapy: Patient Spontanous Breathing  Post-op Pain: mild  Post-op Assessment: Patient's Cardiovascular Status Stable and Respiratory Function Stable              Post-op Vital Signs: stable  Last Vitals:  Filed Vitals:   05/29/15 0623  BP: 129/85  Pulse: 94  Temp: 36.4 C  Resp: 20    Complications: No apparent anesthesia complications

## 2015-05-30 DIAGNOSIS — Z3A39 39 weeks gestation of pregnancy: Secondary | ICD-10-CM

## 2015-05-30 MED ORDER — IBUPROFEN 600 MG PO TABS: 600.0000 mg | ORAL_TABLET | Freq: Four times a day (QID) | ORAL | Status: DC

## 2015-05-30 NOTE — Progress Notes (Signed)
Post Partum Day 1 Subjective:  Brittany Bowers is a 19 y.o. G1P1001 237w6d s/p SVD.  No acute events overnight.  Pt denies problems with ambulating, voiding or po intake.  She denies nausea or vomiting.  Pain is moderately controlled.  She has had flatus. She has not had bowel movement.  Lochia Small.  Plan for birth control is IUD.  Method of Feeding: Bottle  Objective: Blood pressure 117/81, pulse 72, temperature 98.1 F (36.7 C), temperature source Oral, resp. rate 18, height 5\' 4"  (1.626 m), weight 200 lb 2 oz (90.776 kg), last menstrual period 07/18/2014, SpO2 100 %, unknown if currently breastfeeding.  Physical Exam:  General: alert, cooperative and no distress Lochia:normal flow Chest: CTAB Heart: RRR no m/r/g Abdomen: +BS, soft, nontender,  Uterine Fundus: firm, below umbilicus  DVT Evaluation: No evidence of DVT seen on physical exam. Extremities: 1+ edema   Recent Labs  05/28/15 1150  HGB 11.4*  HCT 35.3*    Assessment/Plan:  ASSESSMENT: Brittany Bowers is a 19 y.o. G1P1001 137w6d s/p SVD.   Plan for discharge tomorrow and Contraception IUD   Pt has been having some difficulty bottle feeding and wishes to stay another day for continued help from nursing and peds.    LOS: 2 days   De HollingsheadCatherine L Wallace 05/30/2015, 7:54 AM   OB fellow attestation: I have seen and examined this patient; I agree with above documentation in the resident's note.   Federico FlakeKimberly Niles Newton, MD 11:28 AM

## 2015-05-30 NOTE — Discharge Summary (Signed)
Obstetric Discharge Summary Reason for Admission: onset of labor Prenatal Procedures: none Intrapartum Procedures: spontaneous vaginal delivery Postpartum Procedures: none Complications-Operative and Postpartum: none  At 4:12 AM a viable female was delivered via (Presentation: OA). APGAR: 6, 9; weight 5 lb 14.5 oz (2679 g).  Placenta status: intact , spontaneous . Cord: 3 vessel with the following complications: none. Cord pH: n/a  Anesthesia: epidural Episiotomy: none Lacerations: none Suture Repair: n/a Est. Blood Loss (mL): 350At 4:12 AM a viable female was delivered via  Hospital Course:  Active Problems:   Indication for care or intervention in labor or delivery   NSVD (normal spontaneous vaginal delivery)   Brittany Bowers is a 19 y.o. G1P1001 s/p SVD.  Patient was admitted 07/13.  She has postpartum course that was uncomplicated including no problems with ambulating, PO intake, urination, pain, or bleeding. The pt feels ready to go home and  will be discharged with outpatient follow-up.   Today: No acute events overnight.  Pt denies problems with ambulating, voiding or po intake.  She denies nausea or vomiting.  Pain is well controlled.  She has had flatus. She has not had bowel movement.  Lochia Small.  Plan for birth control is  IUD.  Method of Feeding: Bottle  Physical Exam:  General: alert and cooperative Lochia: appropriate Uterine Fundus: firm DVT Evaluation: No evidence of DVT seen on physical exam.  H/H: Lab Results  Component Value Date/Time   HGB 11.4* 05/28/2015 11:50 AM   HCT 35.3* 05/28/2015 11:50 AM    Discharge Diagnoses: Term Pregnancy-delivered  Discharge Information: Date: 05/30/2015 Activity: pelvic rest Diet: routine  Medications: PNV and Ibuprofen Breast feeding:  No: Bottle Condition: stable Instructions: refer to handout Discharge to: home   Discharge Instructions    Call MD for:  difficulty breathing, headache or  visual disturbances    Complete by:  As directed      Call MD for:  extreme fatigue    Complete by:  As directed      Call MD for:  persistant dizziness or light-headedness    Complete by:  As directed      Call MD for:  persistant nausea and vomiting    Complete by:  As directed      Call MD for:  severe uncontrolled pain    Complete by:  As directed      Call MD for:  temperature >100.4    Complete by:  As directed      Diet - low sodium heart healthy    Complete by:  As directed      Sexual acrtivity    Complete by:  As directed   Pelvic rest for 6 week            Medication List    TAKE these medications        beclomethasone 40 MCG/ACT inhaler  Commonly known as:  QVAR  Inhale 2 puffs into the lungs 2 (two) times daily.     ibuprofen 600 MG tablet  Commonly known as:  ADVIL,MOTRIN  Take 1 tablet (600 mg total) by mouth every 6 (six) hours.     prenatal multivitamin Tabs tablet  Take 1 tablet by mouth daily at 12 noon.         De Hollingshead ,MD OB Fellow 05/30/2015,10:31 AM  OB fellow attestation I have seen and examined this patient and agree with above documentation in the resident's note.   Brittany Bowers is a  19 y.o. G1P1001 s/p NSVD.   Pain is well controlled.  Plan for birth control is IUD.  Method of Feeding: formula  PE:  BP 117/81 mmHg  Pulse 72  Temp(Src) 98.1 F (36.7 C) (Oral)  Resp 18  Ht 5\' 4"  (1.626 m)  Wt 200 lb 2 oz (90.776 kg)  BMI 34.33 kg/m2  SpO2 100%  LMP 07/18/2014  Breastfeeding? Unknown Fundus firm   Recent Labs  05/28/15 1150  HGB 11.4*  HCT 35.3*   Plan: discharge today - postpartum care discussed - f/u clinic in 6 weeks for postpartum visit- discussed pelvic rest in detail and reviewed IUD insertion at her PP visit and risk of pregnancy with resumed sexual activity.   Federico FlakeKimberly Niles Lashawnda Hancox, MD 11:37 AM

## 2015-05-30 NOTE — Clinical Social Work Maternal (Signed)
CLINICAL SOCIAL WORK MATERNAL/CHILD NOTE  Patient Details  Name: Brittany Bowers MRN: [id number] Date of Birth: 03/22/1996  Date:  05/30/2015  Clinical Social Worker Initiating Note:  Loleta BooksSarah Danahi Reddish, LCSW Date/ Time Initiated:  05/30/15/1030     Child's Name:  Brittany BlitzLondon Bowers   Legal Guardian:  Brittany HongSheneka Bowers (mother) and Brittany MaxinMark Bowers (father)  Need for Interpreter:  None   Date of Referral:  12/19/14     Reason for Referral:  Current Substance Use/Substance Use During Pregnancy , History of anxiety/panic attacks   [facility]   Address:    [address] [place], [place] [address] Phone number:    [phone]  Household Members:  Significant Other, Parents, Siblings   Natural Supports (not living in the home):  Extended Family, Immediate Family   Professional Supports: None   Employment:   Did not assess  Type of Work:   N/A  Education:    N/A  Surveyor, quantityinancial Resources:  Medicaid   Other Resources:    [employer], Sales executive[employer] Stamps  Cultural/Religious Considerations Which May Impact Care:  None reported  Strengths:  Ability to meet basic needs , Home prepared for child    Risk Factors/Current Problems:   1)Substance Use: MOB presents with THC use during the pregnancy (+UDS in December and April, negative upon admission). Infant's urine was not collected, but nursing attempting to collect MDS. 2)Mental Health Concerns : MOB presents with history of anxiety/panic attacks, received diagnosis 2-3 years ago. MOB denied mental health concerns during the pregnancy.   Cognitive State:  Able to Concentrate , Alert , Linear Thinking , Goal Oriented    Mood/Affect:  Euthymic , Comfortable , Calm    CSW Assessment:  CSW received request for consult due to MOB presenting with marijuana use during pregnancy and due to history of anxiety/panic attacks.  MOB was receptive to CSW consult, was in a pleasant mood, but was difficult to engage as evidenced by not  disclosing openly her thoughts and feelings related to the transition to postpartum.  MOB presented with a limited range in affect, but she reported feeling tired, exhausted, and ready to go home.  MOB was noted to be smiling and interacting with the infant, and she reported that she was "excited" to become a mother.   MOB denied questions, concerns, or needs as she transitions to the postpartum period. She shared belief that she has a support system at home, and stated that she lives with her mother, sisters, and boyfriend. Per MOB, she has obtained all baby items for the infant, and is looking forward to returning home.  MOB denied current worries or fears related to discharge or caring for the infant. She reported numerous times that she is just "excited".    MOB acknowledged history of anxiety/panic attacks, with onset of symptoms when she was in 11th grade.  MOB denied awareness of any specific triggers, but shared that knew her anxiety was increasing when she felt on "edge".  MOB stated that she did not feel these symptoms during the pregnancy, and expressed normative anxiety prior to learning the gender of the infant and prior to giving birth.  MOB presented as receptive to education on perinatal mood and anxiety disorders, and agreed to contact her medical provider if she notes onset of symptoms.    Per MOB, she used THC until she learned that she was pregnant. MOB shared that once she learned of the pregnancy, she stopped all use. MOB denied any other  substance use during the pregnancy.  MOB was unable to clarify last THC use, and indicated recreational use pre-pregnancy.  MOB acknowledged hospital drug screen policy, and acknowledged that a MDS will be collected on the infant. She denied questions or concerns related to the collection, and expressed belief that it will be negative. MOB acknowledged that a CPS report will be made if there is a positive drug screen.   MOB denied additional questions,  concerns, or needs at this time. She agreed to contact CSW if needs arise.   CSW Plan/Description:   1)Patient/Family Education: Hospital drug screen policy, perinatal mood and anxiety disorders 2) CSW to monitor infant's drug screens and will notify CPS of a positive drug screen. 3)No Further Intervention Required/No Barriers to Discharge    Ieshia Hatcher N, LCSW 05/30/2015, 11:26 AM  

## 2015-10-19 ENCOUNTER — Encounter (HOSPITAL_COMMUNITY): Payer: Self-pay | Admitting: *Deleted

## 2015-10-19 ENCOUNTER — Emergency Department (HOSPITAL_COMMUNITY): Payer: Medicaid Other

## 2015-10-19 ENCOUNTER — Emergency Department (HOSPITAL_COMMUNITY)
Admission: EM | Admit: 2015-10-19 | Discharge: 2015-10-20 | Disposition: A | Discharge status: Home / Self Care | Payer: Medicaid Other | Attending: Emergency Medicine | Admitting: Emergency Medicine

## 2015-10-19 DIAGNOSIS — J45909 Unspecified asthma, uncomplicated: Secondary | ICD-10-CM | POA: Insufficient documentation

## 2015-10-19 DIAGNOSIS — Z3202 Encounter for pregnancy test, result negative: Secondary | ICD-10-CM | POA: Insufficient documentation

## 2015-10-19 DIAGNOSIS — R197 Diarrhea, unspecified: Secondary | ICD-10-CM | POA: Diagnosis not present

## 2015-10-19 DIAGNOSIS — Z87891 Personal history of nicotine dependence: Secondary | ICD-10-CM | POA: Insufficient documentation

## 2015-10-19 DIAGNOSIS — H53149 Visual discomfort, unspecified: Secondary | ICD-10-CM | POA: Insufficient documentation

## 2015-10-19 DIAGNOSIS — R51 Headache: Secondary | ICD-10-CM | POA: Diagnosis not present

## 2015-10-19 DIAGNOSIS — R112 Nausea with vomiting, unspecified: Secondary | ICD-10-CM

## 2015-10-19 DIAGNOSIS — Z7951 Long term (current) use of inhaled steroids: Secondary | ICD-10-CM | POA: Insufficient documentation

## 2015-10-19 LAB — COMPREHENSIVE METABOLIC PANEL
ALK PHOS: 72 U/L (ref 38–126)
ALT: 17 U/L (ref 14–54)
AST: 19 U/L (ref 15–41)
Albumin: 4.4 g/dL (ref 3.5–5.0)
Anion gap: 9 (ref 5–15)
BUN: 11 mg/dL (ref 6–20)
CALCIUM: 9.5 mg/dL (ref 8.9–10.3)
CHLORIDE: 104 mmol/L (ref 101–111)
CO2: 26 mmol/L (ref 22–32)
CREATININE: 0.7 mg/dL (ref 0.44–1.00)
GFR calc non Af Amer: >60 mL/min (ref >60)
Glucose, Bld: 108 mg/dL — ABNORMAL HIGH (ref 65–99)
Potassium: 3.9 mmol/L (ref 3.5–5.1)
Sodium: 139 mmol/L (ref 135–145)
TOTAL PROTEIN: 8.7 g/dL — AB (ref 6.5–8.1)
Total Bilirubin: 0.5 mg/dL (ref 0.3–1.2)

## 2015-10-19 LAB — LIPASE, BLOOD: LIPASE: 24 U/L (ref 11–51)

## 2015-10-19 LAB — CBC
HCT: 37.2 % (ref 36.0–46.0)
Hemoglobin: 11.9 g/dL — ABNORMAL LOW (ref 12.0–15.0)
MCH: 23.8 pg — ABNORMAL LOW (ref 26.0–34.0)
MCHC: 32 g/dL (ref 30.0–36.0)
MCV: 74.5 fL — ABNORMAL LOW (ref 78.0–100.0)
PLATELETS: 417 10*3/uL — AB (ref 150–400)
RBC: 4.99 MIL/uL (ref 3.87–5.11)
RDW: 18 % — ABNORMAL HIGH (ref 11.5–15.5)
WBC: 9.5 10*3/uL (ref 4.0–10.5)

## 2015-10-19 LAB — I-STAT BETA HCG BLOOD, ED (MC, WL, AP ONLY)

## 2015-10-19 MED ORDER — SODIUM CHLORIDE 0.9 % IV BOLUS (SEPSIS)
1000.0000 mL | Freq: Once | INTRAVENOUS | Status: AC
  Administered 2015-10-19: 1000 mL via INTRAVENOUS

## 2015-10-19 MED ORDER — DIPHENHYDRAMINE HCL 50 MG/ML IJ SOLN
25.0000 mg | Freq: Once | INTRAMUSCULAR | Status: AC
  Administered 2015-10-19: 25 mg via INTRAVENOUS
  Filled 2015-10-19: qty 1

## 2015-10-19 MED ORDER — PROCHLORPERAZINE EDISYLATE 5 MG/ML IJ SOLN
10.0000 mg | Freq: Four times a day (QID) | INTRAMUSCULAR | Status: DC | PRN
  Administered 2015-10-19: 10 mg via INTRAVENOUS
  Filled 2015-10-19: qty 2

## 2015-10-19 MED ORDER — ONDANSETRON 4 MG PO TBDP
4.0000 mg | ORAL_TABLET | Freq: Once | ORAL | Status: AC | PRN
  Administered 2015-10-19: 4 mg via ORAL
  Filled 2015-10-19: qty 1

## 2015-10-19 NOTE — ED Notes (Signed)
Pt reports onset of left-sided headache approx 1hr ago, pt then began having n/v/d and generalized body aches.

## 2015-10-20 LAB — URINALYSIS, ROUTINE W REFLEX MICROSCOPIC
BILIRUBIN URINE: NEGATIVE
Glucose, UA: NEGATIVE mg/dL
Hgb urine dipstick: NEGATIVE
KETONES UR: NEGATIVE mg/dL
LEUKOCYTES UA: NEGATIVE
NITRITE: NEGATIVE
PROTEIN: NEGATIVE mg/dL
Specific Gravity, Urine: 1.025 (ref 1.005–1.030)
pH: 7 (ref 5.0–8.0)

## 2015-10-20 NOTE — ED Provider Notes (Signed)
CSN: [id number]     Arrival date & time 10/19/15  2123 History   First MD Initiated Contact with Patient 10/19/15 2215     Chief Complaint  Patient presents with  . Headache  . Emesis  . Diarrhea     (Consider location/radiation/quality/duration/timing/severity/associated sxs/prior Treatment) HPI Comments: 19 y.o. Female with history of asthma presents for headache, nausea, vomiting, diarrhea, body aches.  The patient states that she had been in her normal health earlier in the day but had eaten fast food shortly before the onset of symptoms.  She reports that about an hour before presentation to the ER she developed a left sided headache and then developed pain all over her body as well as vomiting and diarrhea.  She denies sick contacts.  Reports sensitivity to light.  She says she first noted the headache and it was mild but became worse the more vomiting and diarrhea she had.  Patient is a 19 y.o. female presenting with headaches, vomiting, and diarrhea.  Headache Associated symptoms: diarrhea, nausea, photophobia and vomiting   Associated symptoms: no abdominal pain, no back pain, no congestion, no cough, no dizziness, no drainage, no eye pain, no fatigue, no myalgias, no numbness and no weakness   Emesis Associated symptoms: diarrhea and headaches   Associated symptoms: no abdominal pain, no chills and no myalgias   Diarrhea Associated symptoms: headaches and vomiting   Associated symptoms: no abdominal pain, no chills, no diaphoresis and no myalgias     Past Medical History  Diagnosis Date  . Asthma     uses inhaler twice/day   Past Surgical History  Procedure Laterality Date  . No past surgeries     Family History  Problem Relation Age of Onset  . Alcohol abuse Neg Hx   . Arthritis Neg Hx   . Asthma Neg Hx   . Birth defects Neg Hx   . COPD Neg Hx   . Depression Neg Hx   . Drug abuse Neg Hx   . Early death Neg Hx   . Hearing loss Neg Hx   . Heart disease Neg Hx    . Hyperlipidemia Neg Hx   . Hypertension Neg Hx   . Kidney disease Neg Hx   . Learning disabilities Neg Hx   . Mental illness Neg Hx   . Mental retardation Neg Hx   . Miscarriages / Stillbirths Neg Hx   . Vision loss Neg Hx   . Varicose Veins Neg Hx   . Diabetes Father   . Cancer Maternal Aunt   . Stroke Maternal Aunt   . Diabetes Maternal Grandmother   . Stroke Maternal Grandmother     TIA  . Cancer Maternal Grandfather    Social History  Substance Use Topics  . Smoking status: Former Smoker -- 0.50 packs/day    Quit date: 11/17/2012  . Smokeless tobacco: Never Used  . Alcohol Use: Yes     Comment: occational   OB History    Gravida Para Term Preterm AB TAB SAB Ectopic Multiple Living   0  0  0 1     Review of Systems  Constitutional: Negative for chills, diaphoresis, appetite change and fatigue.  HENT: Negative for congestion, postnasal drip and rhinorrhea.   Eyes: Positive for photophobia. Negative for pain, redness and visual disturbance.  Respiratory: Negative for cough, chest tightness, shortness of breath and wheezing.   Cardiovascular: Negative for chest pain and palpitations.  Gastrointestinal: Positive for  nausea, vomiting and diarrhea. Negative for abdominal pain and constipation.  Genitourinary: Negative for dysuria, urgency, hematuria and decreased urine volume.  Musculoskeletal: Negative for myalgias and back pain.  Skin: Negative for rash.  Neurological: Positive for headaches. Negative for dizziness, weakness and numbness.  Hematological: Does not bruise/bleed easily.      Allergies  Bee venom; Onion; and Pork-derived products  Home Medications   Prior to Admission medications   Medication Sig Start Date End Date Taking? Authorizing Provider  beclomethasone (QVAR) 40 MCG/ACT inhaler Inhale 2 puffs into the lungs 2 (two) times daily.   Yes Historical Provider, MD  ibuprofen (ADVIL,MOTRIN) 600 MG tablet Take 1 tablet (600 mg total) by mouth  every 6 (six) hours. Patient not taking: Reported on 10/19/2015 05/30/15   Federico FlakeKimberly Niles Newton, MD   BP 134/89 mmHg  Pulse 83  Temp(Src) 98.7 F (37.1 C) (Oral)  Resp 14  SpO2 99%  LMP  Physical Exam  Constitutional: She is oriented to person, place, and time. She appears well-developed and well-nourished. No distress.  HENT:  Head: Normocephalic and atraumatic.  Right Ear: External ear normal.  Left Ear: External ear normal.  Nose: Nose normal.  Mouth/Throat: Oropharynx is clear and moist. No oropharyngeal exudate.  Eyes: EOM are normal. Pupils are equal, round, and reactive to light.  Neck: Normal range of motion. Neck supple.  Cardiovascular: Normal rate, regular rhythm, normal heart sounds and intact distal pulses.   No murmur heard. Pulmonary/Chest: Effort normal. No respiratory distress. She has no wheezes. She has no rales.  Abdominal: Soft. She exhibits no distension. There is no tenderness.  Musculoskeletal: Normal range of motion. She exhibits no edema or tenderness.  Neurological: She is alert and oriented to person, place, and time. She has normal strength. No cranial nerve deficit or sensory deficit. She exhibits normal muscle tone. Coordination normal.  Skin: Skin is warm and dry. No rash noted. She is not diaphoretic.  Vitals reviewed.   ED Course  Procedures (including critical care time) Labs Review Labs Reviewed  COMPREHENSIVE METABOLIC PANEL - Abnormal; Notable for the following:    Glucose, Bld 108 (*)    Total Protein 8.7 (*)    All other components within normal limits  CBC - Abnormal; Notable for the following:    Hemoglobin 11.9 (*)    MCV 74.5 (*)    MCH 23.8 (*)    RDW 18.0 (*)    Platelets 417 (*)    All other components within normal limits  URINALYSIS, ROUTINE W REFLEX MICROSCOPIC (NOT AT [facility]) - Abnormal; Notable for the following:    APPearance CLOUDY (*)    All other components within normal limits  LIPASE, BLOOD  I-STAT BETA HCG BLOOD,  ED ([facility], [facility], [facility] ONLY)    Imaging Review Ct Head Wo Contrast  10/20/2015  CLINICAL DATA:  19 year old female with left-sided headache, nausea and vomiting. No trauma. EXAM: CT HEAD WITHOUT CONTRAST TECHNIQUE: Contiguous axial images were obtained from the base of the skull through the vertex without intravenous contrast. COMPARISON:  None. FINDINGS: The ventricles and the sulci are appropriate in size for the patient's age. There is no intracranial hemorrhage. No midline shift or mass effect identified. The gray-white matter differentiation is preserved. The visualized paranasal sinuses and mastoid air cells are well aerated. The calvarium is intact. IMPRESSION: No acute intracranial pathology. Electronically Signed   By: Elgie CollardArash  Radparvar M.D.   On: 10/20/2015 00:10   I have personally reviewed and evaluated  these images and lab results as part of my medical decision-making.   EKG Interpretation None      MDM  Patient was seen and evaluated in stable condition.  Benign examination.  Stable vitals. Patient reported acute onset of symptoms.  CT head negative for acute process/bleed.  Patient felt much improved after IV fluids, benadryl, compazine. When reevaluated she states that she "just wants to go home".  Discussed with her concerns for [facility] as  She said the headache came on rather quickly and that CT was only 98% sensitive at this time for catching this but she declined LP or further treatment and again requested discharge.  She was discharged home in stable condition with strict return precautions. Final diagnoses:  Nausea vomiting and diarrhea    1. Headache  2. Nausea, vomiting, diarrhea    Leta Baptist, MD 10/20/15 [phone]

## 2015-10-20 NOTE — Discharge Instructions (Signed)
Nausea and Vomiting °Nausea is a sick feeling that often comes before throwing up (vomiting). Vomiting is a reflex where stomach contents come out of your mouth. Vomiting can cause severe loss of body fluids (dehydration). Children and elderly adults can become dehydrated quickly, especially if they also have diarrhea. Nausea and vomiting are symptoms of a condition or disease. It is important to find the cause of your symptoms. °CAUSES  °· Direct irritation of the stomach lining. This irritation can result from increased acid production (gastroesophageal reflux disease), infection, food poisoning, taking certain medicines (such as nonsteroidal anti-inflammatory drugs), alcohol use, or tobacco use. °· Signals from the brain. These signals could be caused by a headache, heat exposure, an inner ear disturbance, increased pressure in the brain from injury, infection, a tumor, or a concussion, pain, emotional stimulus, or metabolic problems. °· An obstruction in the gastrointestinal tract (bowel obstruction). °· Illnesses such as diabetes, hepatitis, gallbladder problems, appendicitis, kidney problems, cancer, sepsis, atypical symptoms of a heart attack, or eating disorders. °· Medical treatments such as chemotherapy and radiation. °· Receiving medicine that makes you sleep (general anesthetic) during surgery. °DIAGNOSIS °Your caregiver may ask for tests to be done if the problems do not improve after a few days. Tests may also be done if symptoms are severe or if the reason for the nausea and vomiting is not clear. Tests may include: °· Urine tests. °· Blood tests. °· Stool tests. °· Cultures (to look for evidence of infection). °· X-rays or other imaging studies. °Test results can help your caregiver make decisions about treatment or the need for additional tests. °TREATMENT °You need to stay well hydrated. Drink frequently but in small amounts. You may wish to drink water, sports drinks, clear broth, or eat frozen  ice pops or gelatin dessert to help stay hydrated. When you eat, eating slowly may help prevent nausea. There are also some antinausea medicines that may help prevent nausea. °HOME CARE INSTRUCTIONS  °· Take all medicine as directed by your caregiver. °· If you do not have an appetite, do not force yourself to eat. However, you must continue to drink fluids. °· If you have an appetite, eat a normal diet unless your caregiver tells you differently. °· Eat a variety of complex carbohydrates (rice, wheat, potatoes, bread), lean meats, yogurt, fruits, and vegetables. °· Avoid high-fat foods because they are more difficult to digest. °· Drink enough water and fluids to keep your urine clear or pale yellow. °· If you are dehydrated, ask your caregiver for specific rehydration instructions. Signs of dehydration may include: °· Severe thirst. °· Dry lips and mouth. °· Dizziness. °· Dark urine. °· Decreasing urine frequency and amount. °· Confusion. °· Rapid breathing or pulse. °SEEK IMMEDIATE MEDICAL CARE IF:  °· You have blood or brown flecks (like coffee grounds) in your vomit. °· You have black or bloody stools. °· You have a severe headache or stiff neck. °· You are confused. °· You have severe abdominal pain. °· You have chest pain or trouble breathing. °· You do not urinate at least once every 8 hours. °· You develop cold or clammy skin. °· You continue to vomit for longer than 24 to 48 hours. °· You have a fever. °MAKE SURE YOU:  °· Understand these instructions. °· Will watch your condition. °· Will get help right away if you are not doing well or get worse. °  °This information is not intended to replace advice given to you by your health care provider. Make sure   you discuss any questions you have with your health care provider.   Document Released: 11/01/2005 Document Revised: 01/24/2012 Document Reviewed: 03/31/2011 Elsevier Interactive Patient Education 2016 Elsevier Inc.  General Headache Without Cause A  headache is pain or discomfort felt around the head or neck area. The specific cause of a headache may not be found. There are many causes and types of headaches. A few common ones are:  Tension headaches.  Migraine headaches.  Cluster headaches.  Chronic daily headaches. HOME CARE INSTRUCTIONS  Watch your condition for any changes. Take these steps to help with your condition: Managing Pain  Take over-the-counter and prescription medicines only as told by your health care provider.  Lie down in a dark, quiet room when you have a headache.  If directed, apply ice to the head and neck area:  Put ice in a plastic bag.  Place a towel between your skin and the bag.  Leave the ice on for 20 minutes, 2-3 times per day.  Use a heating pad or hot shower to apply heat to the head and neck area as told by your health care provider.  Keep lights dim if bright lights bother you or make your headaches worse. Eating and Drinking  Eat meals on a regular schedule.  Limit alcohol use.  Decrease the amount of caffeine you drink, or stop drinking caffeine. General Instructions  Keep all follow-up visits as told by your health care provider. This is important.  Keep a headache journal to help find out what may trigger your headaches. For example, write down:  What you eat and drink.  How much sleep you get.  Any change to your diet or medicines.  Try massage or other relaxation techniques.  Limit stress.  Sit up straight, and do not tense your muscles.  Do not use tobacco products, including cigarettes, chewing tobacco, or e-cigarettes. If you need help quitting, ask your health care provider.  Exercise regularly as told by your health care provider.  Sleep on a regular schedule. Get 7-9 hours of sleep, or the amount recommended by your health care provider. SEEK MEDICAL CARE IF:   Your symptoms are not helped by medicine.  You have a headache that is different from the  usual headache.  You have nausea or you vomit.  You have a fever. SEEK IMMEDIATE MEDICAL CARE IF:   Your headache becomes severe.  You have repeated vomiting.  You have a stiff neck.  You have a loss of vision.  You have problems with speech.  You have pain in the eye or ear.  You have muscular weakness or loss of muscle control.  You lose your balance or have trouble walking.  You feel faint or pass out.  You have confusion.   This information is not intended to replace advice given to you by your health care provider. Make sure you discuss any questions you have with your health care provider.   Document Released: 11/01/2005 Document Revised: 07/23/2015 Document Reviewed: 02/24/2015 Elsevier Interactive Patient Education [employer] Else[employer] Inc[employer]

## 2017-03-01 ENCOUNTER — Emergency Department (HOSPITAL_COMMUNITY): Payer: Medicaid Other

## 2017-03-01 ENCOUNTER — Encounter (HOSPITAL_COMMUNITY): Payer: Self-pay | Admitting: Emergency Medicine

## 2017-03-01 ENCOUNTER — Emergency Department (HOSPITAL_COMMUNITY)
Admission: EM | Admit: 2017-03-01 | Discharge: 2017-03-01 | Disposition: A | Discharge status: Home / Self Care | Payer: Medicaid Other | Attending: Emergency Medicine | Admitting: Emergency Medicine

## 2017-03-01 DIAGNOSIS — N760 Acute vaginitis: Secondary | ICD-10-CM | POA: Diagnosis not present

## 2017-03-01 DIAGNOSIS — N83521 Torsion of right fallopian tube: Secondary | ICD-10-CM | POA: Diagnosis not present

## 2017-03-01 DIAGNOSIS — B9689 Other specified bacterial agents as the cause of diseases classified elsewhere: Secondary | ICD-10-CM

## 2017-03-01 DIAGNOSIS — J45909 Unspecified asthma, uncomplicated: Secondary | ICD-10-CM | POA: Diagnosis not present

## 2017-03-01 DIAGNOSIS — N83201 Unspecified ovarian cyst, right side: Secondary | ICD-10-CM | POA: Diagnosis not present

## 2017-03-01 DIAGNOSIS — R1031 Right lower quadrant pain: Secondary | ICD-10-CM

## 2017-03-01 DIAGNOSIS — Z79899 Other long term (current) drug therapy: Secondary | ICD-10-CM | POA: Insufficient documentation

## 2017-03-01 DIAGNOSIS — Z87891 Personal history of nicotine dependence: Secondary | ICD-10-CM | POA: Diagnosis not present

## 2017-03-01 DIAGNOSIS — N83519 Torsion of ovary and ovarian pedicle, unspecified side: Secondary | ICD-10-CM

## 2017-03-01 LAB — COMPREHENSIVE METABOLIC PANEL
ALT: 21 U/L (ref 14–54)
ANION GAP: 7 (ref 5–15)
AST: 22 U/L (ref 15–41)
Albumin: 4.2 g/dL (ref 3.5–5.0)
Alkaline Phosphatase: 48 U/L (ref 38–126)
BUN: 8 mg/dL (ref 6–20)
CHLORIDE: 107 mmol/L (ref 101–111)
CO2: 25 mmol/L (ref 22–32)
Calcium: 8.9 mg/dL (ref 8.9–10.3)
Creatinine, Ser: 0.62 mg/dL (ref 0.44–1.00)
Glucose, Bld: 113 mg/dL — ABNORMAL HIGH (ref 65–99)
POTASSIUM: 4 mmol/L (ref 3.5–5.1)
SODIUM: 139 mmol/L (ref 135–145)
Total Bilirubin: 0.6 mg/dL (ref 0.3–1.2)
Total Protein: 7.5 g/dL (ref 6.5–8.1)

## 2017-03-01 LAB — CBC
HEMATOCRIT: 37.9 % (ref 36.0–46.0)
HEMOGLOBIN: 12.4 g/dL (ref 12.0–15.0)
MCH: 28.2 pg (ref 26.0–34.0)
MCHC: 32.7 g/dL (ref 30.0–36.0)
MCV: 86.3 fL (ref 78.0–100.0)
Platelets: 323 10*3/uL (ref 150–400)
RBC: 4.39 MIL/uL (ref 3.87–5.11)
RDW: 14.8 % (ref 11.5–15.5)
WBC: 6.8 10*3/uL (ref 4.0–10.5)

## 2017-03-01 LAB — WET PREP, GENITAL
Sperm: NONE SEEN
Trich, Wet Prep: NONE SEEN
YEAST WET PREP: NONE SEEN

## 2017-03-01 LAB — URINALYSIS, ROUTINE W REFLEX MICROSCOPIC
BILIRUBIN URINE: NEGATIVE
Glucose, UA: NEGATIVE mg/dL
Hgb urine dipstick: NEGATIVE
KETONES UR: NEGATIVE mg/dL
Nitrite: NEGATIVE
PH: 9 — AB (ref 5.0–8.0)
Protein, ur: 30 mg/dL — AB
Specific Gravity, Urine: 1.021 (ref 1.005–1.030)

## 2017-03-01 LAB — LIPASE, BLOOD: LIPASE: 19 U/L (ref 11–51)

## 2017-03-01 LAB — POC URINE PREG, ED: Preg Test, Ur: NEGATIVE

## 2017-03-01 MED ORDER — ONDANSETRON 4 MG PO TBDP: 4.0000 mg | ORAL_TABLET | Freq: Four times a day (QID) | ORAL | 0 refills | Status: DC | PRN

## 2017-03-01 MED ORDER — METRONIDAZOLE 500 MG PO TABS: 500.0000 mg | ORAL_TABLET | Freq: Two times a day (BID) | ORAL | 0 refills | Status: DC

## 2017-03-01 MED ORDER — ONDANSETRON HCL 4 MG/2ML IJ SOLN
4.0000 mg | Freq: Once | INTRAMUSCULAR | Status: AC
  Administered 2017-03-01: 4 mg via INTRAVENOUS
  Filled 2017-03-01: qty 2

## 2017-03-01 MED ORDER — SODIUM CHLORIDE 0.9 % IV BOLUS (SEPSIS)
1000.0000 mL | Freq: Once | INTRAVENOUS | Status: AC
Start: 2017-03-01 — End: 2017-03-01
  Administered 2017-03-01: 1000 mL via INTRAVENOUS

## 2017-03-01 NOTE — ED Triage Notes (Signed)
Pt had a sudden onset of lower abd pain with back pain this morning with n/v.

## 2017-03-01 NOTE — Discharge Instructions (Signed)
You do not have Ovarian Torsion. You have a cyst on your right ovary and BV.  DO NOT TAKE ALCOHOL WHILE TAKING YOUR ANTIBIOTIC!!!  You still have cultures for gonorrhea and chlamydia pending.  You will be notified if you need any treatment.

## 2017-03-01 NOTE — ED Notes (Signed)
Patient transported to Ultrasound 

## 2017-03-01 NOTE — ED Provider Notes (Signed)
[facility] DEPT Provider Note   CSN: [id number] Arrival date & time: 03/01/17  1050     History   Chief Complaint Chief Complaint  Patient presents with  . Abdominal Pain    HPI Brittany Bowers is a 21 y.o. female.  HPI   She presents today with right lower quadrant pain.  The pain has not radiated or change in characteristic since it began. The pain woke her up at 4am.  It has been gradually been getting worse since then.  It is associated with nausea, vomiting, and diarrhea.  She reports she has vomited approx 11 times and had seven episodes of diarrhea. 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 She reports no history of abdominal/pelvic surgeries.   No fevers, changes in appetite.  She was well when she went to bed last night.  She denies abnormal vaginal discharge, abnormal vaginal spotting/bleeding.    Past Medical History:  Diagnosis Date  . Asthma    uses inhaler twice/day    Patient Active Problem List   Diagnosis Date Noted  . NSVD (normal spontaneous vaginal delivery) 05/30/2015  . Indication for care or intervention in labor or delivery 05/28/2015  . Pregnant 10/14/2014  . Nausea and vomiting in pregnancy 10/14/2014    Past Surgical History:  Procedure Laterality Date  . NO PAST SURGERIES      OB History    Gravida Para Term Preterm AB Living   0 1   SAB TAB Ectopic Multiple Live Births   0     0 1       Home Medications    Prior to Admission medications   Medication Sig Start Date End Date Taking? Authorizing Provider  beclomethasone (QVAR) 40 MCG/ACT inhaler Inhale 2 puffs into the lungs 2 (two) times daily.   Yes Historical Provider, MD  ibuprofen (ADVIL,MOTRIN) 600 MG tablet Take 1 tablet (600 mg total) by mouth every 6 (six) hours. 05/30/15  Yes Federico Flake, MD  metroNIDAZOLE (FLAGYL) 500 MG tablet Take 1 tablet (500 mg total) by mouth 2 (two) times daily.  03/01/17   Cristina Gong, PA-C  ondansetron (ZOFRAN ODT) 4 MG disintegrating tablet Take 1 tablet (4 mg total) by mouth every 6 (six) hours as needed for nausea or vomiting. 03/01/17   Cristina Gong, PA-C    Family History Family History  Problem Relation Age of Onset  . Diabetes Father   . Diabetes Maternal Grandmother   . Stroke Maternal Grandmother     TIA  . Cancer Maternal Grandfather   . Cancer Maternal Aunt   . Stroke Maternal Aunt   . Alcohol abuse Neg Hx   . Arthritis Neg Hx   . Asthma Neg Hx   . Birth defects Neg Hx   . COPD Neg Hx   . Depression Neg Hx   . Drug abuse Neg Hx   . Early death Neg Hx   . Hearing loss Neg Hx   . Heart disease Neg Hx   . Hyperlipidemia Neg Hx   . Hypertension Neg Hx   . Kidney disease Neg Hx   . Learning disabilities Neg Hx   . Mental illness Neg Hx   . Mental retardation Neg Hx   . Miscarriages / Stillbirths Neg Hx   . Vision loss Neg Hx   . Varicose Veins Neg Hx     Social  History Social History  Substance Use Topics  . Smoking status: Former Smoker    Packs/day: 0.50    Quit date: 11/17/2012  . Smokeless tobacco: Never Used  . Alcohol use Yes     Comment: occational     Allergies   Bee venom; Onion; and Pork-derived products   Review of Systems Review of Systems  Constitutional: Negative for chills and fever.  HENT: Negative for ear pain and sore throat.   Eyes: Negative for pain and visual disturbance.  Respiratory: Negative for cough and shortness of breath.   Cardiovascular: Negative for chest pain and palpitations.  Gastrointestinal: Positive for nausea and vomiting. Negative for abdominal distention, abdominal pain and diarrhea.  Genitourinary: Positive for pelvic pain. Negative for decreased urine volume, difficulty urinating, dysuria, frequency, hematuria, urgency and vaginal discharge.  Musculoskeletal: Negative for arthralgias and back pain.  Skin: Negative for color change and rash.    Neurological: Negative for seizures and syncope.  All other systems reviewed and are negative.    Physical Exam Updated Vital Signs BP 112/77   Pulse (!) 57   Temp 98.2 F (36.8 C) (Oral)   Resp 16   SpO2 97%   Physical Exam  Constitutional: She appears well-developed and well-nourished. No distress.  HENT:  Head: Normocephalic and atraumatic.  Eyes: Conjunctivae are normal. Right eye exhibits no discharge. Left eye exhibits no discharge. No scleral icterus.  Neck: Neck supple.  Cardiovascular: Normal rate, regular rhythm and normal heart sounds.   No murmur heard. Pulmonary/Chest: Effort normal and breath sounds normal. No respiratory distress.  Abdominal: Soft. Bowel sounds are normal. She exhibits no distension and no mass. There is no hepatosplenomegaly. There is tenderness in the right lower quadrant. There is no rigidity, no rebound, no guarding, no CVA tenderness, no tenderness at McBurney's point and negative Murphy's sign.  Genitourinary: Vagina normal and uterus normal. Pelvic exam was performed with patient prone. There is no rash or tenderness on the right labia. There is no rash or tenderness on the left labia. Cervix exhibits no motion tenderness, no discharge and no friability. Right adnexum displays tenderness. Right adnexum displays no mass and no fullness. Left adnexum displays no mass, no tenderness and no fullness. No erythema in the vagina. No signs of injury around the vagina.  Genitourinary Comments: Strings from IUD in place  Musculoskeletal: She exhibits no edema.  Neurological: She is alert.  Skin: Skin is warm and dry.  Psychiatric: She has a normal mood and affect. Her behavior is normal.  Nursing note and vitals reviewed.    ED Treatments / Results  Labs (all labs ordered are listed, but only abnormal results are displayed) Labs Reviewed  WET PREP, GENITAL - Abnormal; Notable for the following:       Result Value   Clue Cells Wet Prep HPF POC  PRESENT (*)    WBC, Wet Prep HPF POC MANY (*)    All other components within normal limits  COMPREHENSIVE METABOLIC PANEL - Abnormal; Notable for the following:    Glucose, Bld 113 (*)    All other components within normal limits  URINALYSIS, ROUTINE W REFLEX MICROSCOPIC - Abnormal; Notable for the following:    APPearance HAZY (*)    pH 9.0 (*)    Protein, ur 30 (*)    Leukocytes, UA SMALL (*)    Bacteria, UA MANY (*)    Squamous Epithelial / LPF 6-30 (*)    All other components within normal limits  LIPASE, BLOOD  CBC  POC URINE PREG, ED  GC/CHLAMYDIA PROBE AMP ([facility]) NOT AT [facility]    EKG  EKG Interpretation None       Radiology US Transvaginal Non-ob  Result Date: 03/01/2017 CLINICAL DATA:  Right lower quadrant pain since this morning EXAM: TRANSABDOMINAL AND TRANSVAGINAL ULTRASOUND OF PELVIS DOPPLER ULTRASOUND OF OVARIES TECHNIQUE: Both transabdominal and transvaginal ultrasound examinations of the pelvis were performed. Transabdominal technique was performed for global imaging of the pelvis including uterus, ovaries, adnexal regions, and pelvic cul-de-sac. It was necessary to proceed with endovaginal exam following the transabdominal exam to visualize the endometrium. Color and duplex Doppler ultrasound was utilized to evaluate blood flow to the ovaries. COMPARISON:  None. FINDINGS: Uterus Measurements: 6.9 x 4.9 x 5.7 cm. Retroverted. No fibroids or other mass visualized. Endometrium Thickness: 11 mm. IUD in the uterine body. No focal abnormality visualized. Right ovary Measurements: 5.2 x 2.8 x 3.0 cm. 2.6 x 2.3 x 2.3 cm complex ovarian cyst, compatible with a benign hemorrhagic cyst with retracted clot. Left ovary Measurements: 3.6 x 1.7 x 1.5 cm. Normal appearance/no adnexal mass. Pulsed Doppler evaluation of both ovaries demonstrates normal low-resistance arterial and venous waveforms. Other findings No abnormal free fluid. IMPRESSION: IUD in satisfactory position. 2.6 cm  benign hemorrhagic right ovarian cyst, physiologic. No evidence of ovarian torsion. Electronically Signed   By: Charline Bills M.D.   On: 03/01/2017 15:44   US Pelvis Complete  Result Date: 03/01/2017 CLINICAL DATA:  Right lower quadrant pain since this morning EXAM: TRANSABDOMINAL AND TRANSVAGINAL ULTRASOUND OF PELVIS DOPPLER ULTRASOUND OF OVARIES TECHNIQUE: Both transabdominal and transvaginal ultrasound examinations of the pelvis were performed. Transabdominal technique was performed for global imaging of the pelvis including uterus, ovaries, adnexal regions, and pelvic cul-de-sac. It was necessary to proceed with endovaginal exam following the transabdominal exam to visualize the endometrium. Color and duplex Doppler ultrasound was utilized to evaluate blood flow to the ovaries. COMPARISON:  None. FINDINGS: Uterus Measurements: 6.9 x 4.9 x 5.7 cm. Retroverted. No fibroids or other mass visualized. Endometrium Thickness: 11 mm. IUD in the uterine body. No focal abnormality visualized. Right ovary Measurements: 5.2 x 2.8 x 3.0 cm. 2.6 x 2.3 x 2.3 cm complex ovarian cyst, compatible with a benign hemorrhagic cyst with retracted clot. Left ovary Measurements: 3.6 x 1.7 x 1.5 cm. Normal appearance/no adnexal mass. Pulsed Doppler evaluation of both ovaries demonstrates normal low-resistance arterial and venous waveforms. Other findings No abnormal free fluid. IMPRESSION: IUD in satisfactory position. 2.6 cm benign hemorrhagic right ovarian cyst, physiologic. No evidence of ovarian torsion. Electronically Signed   By: Charline Bills M.D.   On: 03/01/2017 15:44   [place] Art/ven Flow Abd Pelv Doppler  Result Date: 03/01/2017 CLINICAL DATA:  Right lower quadrant pain since this morning EXAM: TRANSABDOMINAL AND TRANSVAGINAL ULTRASOUND OF PELVIS DOPPLER ULTRASOUND OF OVARIES TECHNIQUE: Both transabdominal and transvaginal ultrasound examinations of the pelvis were performed. Transabdominal technique was performed  for global imaging of the pelvis including uterus, ovaries, adnexal regions, and pelvic cul-de-sac. It was necessary to proceed with endovaginal exam following the transabdominal exam to visualize the endometrium. Color and duplex Doppler ultrasound was utilized to evaluate blood flow to the ovaries. COMPARISON:  None. FINDINGS: Uterus Measurements: 6.9 x 4.9 x 5.7 cm. Retroverted. No fibroids or other mass visualized. Endometrium Thickness: 11 mm. IUD in the uterine body. No focal abnormality visualized. Right ovary Measurements: 5.2 x 2.8 x 3.0 cm. 2.6 x 2.3 x 2.3 cm complex  ovarian cyst, compatible with a benign hemorrhagic cyst with retracted clot. Left ovary Measurements: 3.6 x 1.7 x 1.5 cm. Normal appearance/no adnexal mass. Pulsed Doppler evaluation of both ovaries demonstrates normal low-resistance arterial and venous waveforms. Other findings No abnormal free fluid. IMPRESSION: IUD in satisfactory position. 2.6 cm benign hemorrhagic right ovarian cyst, physiologic. No evidence of ovarian torsion. Electronically Signed   By: Charline Bills M.D.   On: 03/01/2017 15:44    Procedures Procedures (including critical care time)  Medications Ordered in ED Medications  sodium chloride 0.9 % bolus 1,000 mL (0 mLs Intravenous Stopped 03/01/17 1440)  ondansetron (ZOFRAN) injection 4 mg (4 mg Intravenous Given 03/01/17 1443)     Initial Impression / Assessment and Plan / ED Course  I have reviewed the triage vital signs and the nursing notes.  Pertinent labs & imaging results that were available during my care of the patient were reviewed by me and considered in my medical decision making (see chart for details).     SHAMIRA TOUTANT was found to have a cyst on her right ovary.  The ovary was not torsed.  Her pain and nausea were controlled while in the ED and she was instructed to use ibuprofen for pain and the prescribed zofran for any nausea. Patient to be discharged with instructions to follow  up with OBGYN. Discussed importance of using protection when sexually active. Pt understands that they have GC/Chlamydia cultures pending and that they will need to inform all sexual partners and seek treatment if results return positive. Pt not concerning for PID because hemodynamically stable and no cervical motion tenderness on pelvic exam. Pt has also been treated with flagyl for Bacterial Vaginosis. Pt has been advised to not drink alcohol while on this medication. Patient was given return precautions and voiced her understanding stating she was ready to go home.     Final Clinical Impressions(s) / ED Diagnoses   Final diagnoses:  Ovarian torsion  BV (bacterial vaginosis)  Right lower quadrant abdominal pain  Cyst of right ovary    New Prescriptions Discharge Medication List as of 03/01/2017  4:07 PM    START taking these medications   Details  metroNIDAZOLE (FLAGYL) 500 MG tablet Take 1 tablet (500 mg total) by mouth 2 (two) times daily., Starting Tue 03/01/2017, Print    ondansetron (ZOFRAN ODT) 4 MG disintegrating tablet Take 1 tablet (4 mg total) by mouth every 6 (six) hours as needed for nausea or vomiting., Starting Tue 03/01/2017, Print         Cristina Gong, PA-C 03/01/17 1954    Jerelyn Scott, MD 03/02/17 [phone]

## 2017-03-01 NOTE — ED Notes (Signed)
PT did not have to use RR at this time  

## 2017-03-02 LAB — GC/CHLAMYDIA PROBE AMP (~~LOC~~) NOT AT ARMC
Chlamydia: NEGATIVE
NEISSERIA GONORRHEA: NEGATIVE

## 2017-03-15 ENCOUNTER — Emergency Department (HOSPITAL_COMMUNITY)
Admission: EM | Admit: 2017-03-15 | Discharge: 2017-03-15 | Disposition: A | Discharge status: Home / Self Care | Payer: Medicaid Other | Attending: Physician Assistant | Admitting: Physician Assistant

## 2017-03-15 ENCOUNTER — Encounter (HOSPITAL_COMMUNITY): Payer: Self-pay | Admitting: Emergency Medicine

## 2017-03-15 DIAGNOSIS — W57XXXA Bitten or stung by nonvenomous insect and other nonvenomous arthropods, initial encounter: Secondary | ICD-10-CM | POA: Diagnosis not present

## 2017-03-15 DIAGNOSIS — Z79899 Other long term (current) drug therapy: Secondary | ICD-10-CM | POA: Insufficient documentation

## 2017-03-15 DIAGNOSIS — J45909 Unspecified asthma, uncomplicated: Secondary | ICD-10-CM | POA: Diagnosis not present

## 2017-03-15 DIAGNOSIS — Y999 Unspecified external cause status: Secondary | ICD-10-CM | POA: Insufficient documentation

## 2017-03-15 DIAGNOSIS — Y939 Activity, unspecified: Secondary | ICD-10-CM | POA: Diagnosis not present

## 2017-03-15 DIAGNOSIS — L02416 Cutaneous abscess of left lower limb: Secondary | ICD-10-CM | POA: Insufficient documentation

## 2017-03-15 DIAGNOSIS — Y929 Unspecified place or not applicable: Secondary | ICD-10-CM | POA: Diagnosis not present

## 2017-03-15 MED ORDER — SULFAMETHOXAZOLE-TRIMETHOPRIM 800-160 MG PO TABS
1.0000 | ORAL_TABLET | Freq: Once | ORAL | Status: AC
  Administered 2017-03-15: 1 via ORAL
  Filled 2017-03-15: qty 1

## 2017-03-15 MED ORDER — PREDNISONE 20 MG PO TABS
40.0000 mg | ORAL_TABLET | Freq: Once | ORAL | Status: AC
  Administered 2017-03-15: 40 mg via ORAL
  Filled 2017-03-15: qty 2

## 2017-03-15 MED ORDER — PREDNISONE 10 MG PO TABS: 20.0000 mg | ORAL_TABLET | Freq: Two times a day (BID) | ORAL | 0 refills | Status: DC

## 2017-03-15 MED ORDER — TETANUS-DIPHTH-ACELL PERTUSSIS 5-2.5-18.5 LF-MCG/0.5 IM SUSP
0.5000 mL | Freq: Once | INTRAMUSCULAR | Status: DC
  Filled 2017-03-15: qty 0.5

## 2017-03-15 MED ORDER — LIDOCAINE HCL (PF) 1 % IJ SOLN
5.0000 mL | Freq: Once | INTRAMUSCULAR | Status: AC
  Administered 2017-03-15: 5 mL
  Filled 2017-03-15: qty 5

## 2017-03-15 MED ORDER — DIPHENHYDRAMINE HCL 25 MG PO TABS: 25.0000 mg | ORAL_TABLET | Freq: Three times a day (TID) | ORAL | 0 refills | Status: DC | PRN

## 2017-03-15 MED ORDER — SULFAMETHOXAZOLE-TRIMETHOPRIM 800-160 MG PO TABS: 1.0000 | ORAL_TABLET | Freq: Two times a day (BID) | ORAL | 0 refills | Status: AC

## 2017-03-15 NOTE — ED Provider Notes (Signed)
[facility]ER[facility]Y DEPT Provider Note   CSN: [id number] Arrival date & time: 03/15/17  1901  By signing my name below, I, Brittany Bowers, attest that this documentation has been prepared under the direction and in the presence of Brittany Bowers M. Damian Leavell, NP. Electronically Signed: Linna Bowers, Scribe. 03/15/2017. 7:51 PM.  History   Chief Complaint Chief Complaint  Patient presents with  . Abscess   The history is provided by the patient. No language interpreter was used.  Abscess  Location:  Leg Leg abscess location:  L upper leg Size:  3 cm Abscess quality: painful, redness and warmth   Red streaking: no   Duration:  2 days Progression:  Worsening Pain details:    Quality:  Shooting and aching   Severity:  Moderate   Duration:  2 days   Timing:  Constant   Progression:  Worsening Chronicity:  New Context: insect bite/sting   Context: not diabetes, not immunosuppression, not injected drug use and not skin injury   Relieved by:  Nothing Exacerbated by: applied pressure. Ineffective treatments: acetaminophen. Associated symptoms: no anorexia, no fatigue, no fever, no headaches, no nausea and no vomiting   Risk factors: no family hx of MRSA and no hx of MRSA    HPI Comments: Brittany Bowers is a 21 y.o. female who presents to the Emergency Department complaining of a moderate, gradually worsening area of pain and swelling to her left thigh for a couple of days. She believes she was bitten by a mosquito but did not visualize any insects on her body. Patient reports some associated warmth and erythema to the swollen area. She states the pain shoots down her left lower extremity. Pt has tried Tylenol without any improvement of her pain. She notes the pain is worse with applied pressure to the area of swelling. Tetanus status is unknown. NKDA. She denies nausea, vomiting, abdominal pain, fevers, chills, or any other associated symptoms.  Past Medical History:  Diagnosis Date  . Asthma    uses inhaler twice/day    Patient Active Problem List   Diagnosis Date Noted  . NSVD (normal spontaneous vaginal delivery) 05/30/2015  . Indication for care or intervention in labor or delivery 05/28/2015  . Pregnant 10/14/2014  . Nausea and vomiting in pregnancy 10/14/2014    Past Surgical History:  Procedure Laterality Date  . NO PAST SURGERIES      OB History    Gravida Para Term Preterm AB Living   0 1   SAB TAB Ectopic Multiple Live Births   0     0 1       Home Medications    Prior to Admission medications   Medication Sig Start Date End Date Taking? Authorizing Provider  beclomethasone (QVAR) 40 MCG/ACT inhaler Inhale 2 puffs into the lungs 2 (two) times daily.    Historical Provider, MD  diphenhydrAMINE (BENADRYL) 25 MG tablet Take 1 tablet (25 mg total) by mouth every 8 (eight) hours as needed. 03/15/17   Brittany Orlene Och, NP  ibuprofen (ADVIL,MOTRIN) 600 MG tablet Take 1 tablet (600 mg total) by mouth every 6 (six) hours. 05/30/15   Federico Flake, MD  metroNIDAZOLE (FLAGYL) 500 MG tablet Take 1 tablet (500 mg total) by mouth 2 (two) times daily. 03/01/17   Cristina Gong, PA-C  ondansetron (ZOFRAN ODT) 4 MG disintegrating tablet Take 1 tablet (4 mg total) by mouth every 6 (six) hours as needed for nausea or vomiting. 03/01/17  Cristina Gong, PA-C  predniSONE (DELTASONE) 10 MG tablet Take 2 tablets (20 mg total) by mouth 2 (two) times daily with a meal. 03/15/17   Brittany Orlene Och, NP  sulfamethoxazole-trimethoprim (BACTRIM DS,SEPTRA DS) 800-160 MG tablet Take 1 tablet by mouth 2 (two) times daily. 03/15/17 03/22/17  Brittany Orlene Och, NP    Family History Family History  Problem Relation Age of Onset  . Diabetes Father   . Diabetes Maternal Grandmother   . Stroke Maternal Grandmother     TIA  . Cancer Maternal Grandfather   . Cancer Maternal Aunt   . Stroke Maternal Aunt   . Alcohol abuse Neg Hx   . Arthritis Neg Hx   . Asthma Neg Hx   . Birth defects  Neg Hx   . COPD Neg Hx   . Depression Neg Hx   . Drug abuse Neg Hx   . Early death Neg Hx   . Hearing loss Neg Hx   . Heart disease Neg Hx   . Hyperlipidemia Neg Hx   . Hypertension Neg Hx   . Kidney disease Neg Hx   . Learning disabilities Neg Hx   . Mental illness Neg Hx   . Mental retardation Neg Hx   . Miscarriages / Stillbirths Neg Hx   . Vision loss Neg Hx   . Varicose Veins Neg Hx     Social History Social History  Substance Use Topics  . Smoking status: Former Smoker    Packs/day: 0.50    Quit date: 11/17/2012  . Smokeless tobacco: Never Used  . Alcohol use Yes     Comment: occational     Allergies   Bee venom; Onion; and Pork-derived products   Review of Systems Review of Systems  Constitutional: Negative for chills, fatigue and fever.  Gastrointestinal: Negative for abdominal pain, anorexia, nausea and vomiting.  Musculoskeletal: Positive for myalgias.  Skin: Positive for color change and wound.  Neurological: Negative for headaches.  All other systems reviewed and are negative.  Physical Exam Updated Vital Signs BP 135/84 (BP Location: Right Arm)   Pulse 87   Temp 98.7 F (37.1 C) (Oral)   Resp 16   SpO2 100%   Physical Exam  Constitutional: She is oriented to person, place, and time. She appears well-developed and well-nourished. No distress.  HENT:  Head: Normocephalic and atraumatic.  Eyes: Conjunctivae and EOM are normal.  Neck: Neck supple. No tracheal deviation present.  Cardiovascular: Normal rate.   Pulmonary/Chest: Effort normal. No respiratory distress.  Musculoskeletal: Normal range of motion.  Neurological: She is alert and oriented to person, place, and time.  Skin: Skin is warm and dry. There is erythema.  Left thigh: 3 cm raised, slightly erythematous area with increased warmth and a dark Bowers that appears to be an insect bite. No red streaking.  Psychiatric: She has a normal mood and affect. Her behavior is normal.  Nursing  note and vitals reviewed.  ED Treatments / Results  Labs (all labs ordered are listed, but only abnormal results are displayed) Labs Reviewed - No data to display  I discussed with the patient prior to I&D that the area is firm and there may not be any results from the I&D.  Discussed trying antibiotics and warm wet compresses. Patient request that we try I&D to see if there is drainage.   Procedures .Marland KitchenIncision and Drainage Date/Time: 03/15/2017 8:58 PM Performed by: Janne Napoleon Authorized by: Janne Napoleon   Consent:  Consent obtained:  Verbal   Consent given by:  Patient   Risks discussed:  Pain   Alternatives discussed:  No treatment Location:    Type:  Abscess   Size:  3 cm   Location:  Lower extremity   Lower extremity location:  Leg   Leg location:  L upper leg Pre-procedure details:    Skin preparation:  Betadine Anesthesia (see MAR for exact dosages):    Anesthesia method:  Local infiltration   Local anesthetic:  Lidocaine 2% w/o epi Procedure type:    Complexity:  Simple Procedure details:    Incision types:  Single straight   Incision depth:  Dermal   Scalpel blade:  11   Wound management:  Probed and deloculated   Drainage:  Bloody   Drainage amount:  Scant   Wound treatment:  Wound left open   Packing materials:  None Post-procedure details:    Patient tolerance of procedure:  Tolerated well, no immediate complications    (including critical care time)  DIAGNOSTIC STUDIES: Oxygen Saturation is 99% on RA, normal by my interpretation.    COORDINATION OF CARE: 7:55 PM Discussed treatment plan with pt at bedside and pt agreed to plan.  Medications Ordered in ED Medications  lidocaine (PF) (XYLOCAINE) 1 % injection 5 mL (5 mLs Infiltration Given by Other 03/15/17 2028)  sulfamethoxazole-trimethoprim (BACTRIM DS,SEPTRA DS) 800-160 MG per tablet 1 tablet (1 tablet Oral Given 03/15/17 2126)  predniSONE (DELTASONE) tablet 40 mg (40 mg Oral Given 03/15/17 2126)      Initial Impression / Assessment and Plan / ED Course  I have reviewed the triage vital signs and the nursing notes.   Final Clinical Impressions(s) / ED Diagnoses  Patient with skin abscess. Incision and drainage performed in the ED today.  Abscess was not large enough to warrant packing or drain placement. Wound recheck in 2 days. Supportive care and return precautions discussed.  Pt sent home with  The patient appears reasonably screened and/or stabilized for discharge and I doubt any other emergent medical condition requiring further screening, evaluation, or treatment in the ED prior to discharge. Final diagnoses:  Bug bite with infection, initial encounter    New Prescriptions Discharge Medication List as of 03/15/2017  9:02 PM    START taking these medications   Details  diphenhydrAMINE (BENADRYL) 25 MG tablet Take 1 tablet (25 mg total) by mouth every 8 (eight) hours as needed., Starting Tue 03/15/2017, Print    predniSONE (DELTASONE) 10 MG tablet Take 2 tablets (20 mg total) by mouth 2 (two) times daily with a meal., Starting Tue 03/15/2017, Print    sulfamethoxazole-trimethoprim (BACTRIM DS,SEPTRA DS) 800-160 MG tablet Take 1 tablet by mouth 2 (two) times daily., Starting Tue 03/15/2017, Until Tue 03/22/2017, Print       I personally performed the services described in this documentation, which was scribed in my presence. The recorded information has been reviewed and is accurate.    [address] [place], NP 03/16/17 0231    Courteney Randall An, MD 03/16/17 [phone]

## 2017-03-15 NOTE — ED Notes (Signed)
Lidocaine and supplies at bedside.  

## 2017-03-15 NOTE — ED Triage Notes (Signed)
Pt sts abscess to left upper leg from possible insect bite with swelling and pain

## 2017-03-17 ENCOUNTER — Emergency Department (HOSPITAL_COMMUNITY)
Admission: EM | Admit: 2017-03-17 | Discharge: 2017-03-17 | Disposition: A | Discharge status: Home / Self Care | Payer: Medicaid Other | Attending: Emergency Medicine | Admitting: Emergency Medicine

## 2017-03-17 ENCOUNTER — Encounter (HOSPITAL_COMMUNITY): Payer: Self-pay

## 2017-03-17 DIAGNOSIS — M79605 Pain in left leg: Secondary | ICD-10-CM | POA: Diagnosis present

## 2017-03-17 DIAGNOSIS — L03116 Cellulitis of left lower limb: Secondary | ICD-10-CM

## 2017-03-17 DIAGNOSIS — J45909 Unspecified asthma, uncomplicated: Secondary | ICD-10-CM | POA: Diagnosis not present

## 2017-03-17 DIAGNOSIS — Z87891 Personal history of nicotine dependence: Secondary | ICD-10-CM | POA: Insufficient documentation

## 2017-03-17 DIAGNOSIS — Z79899 Other long term (current) drug therapy: Secondary | ICD-10-CM | POA: Insufficient documentation

## 2017-03-17 LAB — POC URINE PREG, ED: Preg Test, Ur: NEGATIVE

## 2017-03-17 MED ORDER — CEFTRIAXONE SODIUM 1 G IJ SOLR
1.0000 g | Freq: Once | INTRAMUSCULAR | Status: AC
  Administered 2017-03-17: 1 g via INTRAMUSCULAR
  Filled 2017-03-17: qty 10

## 2017-03-17 MED ORDER — SULFAMETHOXAZOLE-TRIMETHOPRIM 800-160 MG PO TABS: 1.0000 | ORAL_TABLET | Freq: Two times a day (BID) | ORAL | 0 refills | Status: AC

## 2017-03-17 MED ORDER — KETOROLAC TROMETHAMINE 30 MG/ML IJ SOLN
30.0000 mg | Freq: Once | INTRAMUSCULAR | Status: AC
  Administered 2017-03-17: 30 mg via INTRAMUSCULAR
  Filled 2017-03-17: qty 1

## 2017-03-17 MED ORDER — LIDOCAINE HCL (PF) 1 % IJ SOLN
INTRAMUSCULAR | Status: AC
  Administered 2017-03-17: 5 mL
  Filled 2017-03-17: qty 5

## 2017-03-17 NOTE — ED Triage Notes (Signed)
Pt states she was bitten by an insect and came here and had abscess drained. Pt is back because pain has persisted. Swelling and redness noted to left upper thigh.

## 2017-03-17 NOTE — ED Provider Notes (Signed)
[facility]EMER[facility]ENCY DEPT Provider Note   CSN: [id number] Arrival date & time: 03/17/17  1348     History   Chief Complaint Chief Complaint  Patient presents with  . Leg Pain    HPI Brittany Bowers is a 21 y.o. female.  HPI Brittany Bowers is a 21 y.o. female who presents to the Emergency Department complaining of a moderate, gradually worsening area of pain and swelling to her left thigh for a "couple of days." She believes she was bitten by a mosquito but did not visualize any insects on her body. Patient reports some associated warmth and erythema to the swollen area. She states the pain shoots down her left lower extremity. Pt has tried Tylenol without any improvement of her pain. She notes the pain is worse with applied pressure to the area of swelling. Patient was seen on 5/2 for same at that time she had abscess drained. Nose review is negative little purulent fluid. Started patient on Bactrim and steroids. Patient states that she did not get antibiotics filled as she cannot afford them. Denies any further purulent drainage. Denies any fever, nausea, vomiting. Has been taking Tylenol for the pain with little relief. Requesting work note.  Past Medical History:  Diagnosis Date  . Asthma    uses inhaler twice/day    Patient Active Problem List   Diagnosis Date Noted  . NSVD (normal spontaneous vaginal delivery) 05/30/2015  . Indication for care or intervention in labor or delivery 05/28/2015  . Pregnant 10/14/2014  . Nausea and vomiting in pregnancy 10/14/2014    Past Surgical History:  Procedure Laterality Date  . NO PAST SURGERIES      OB History    Gravida Para Term Preterm AB Living   1 1 1    0 1   SAB TAB Ectopic Multiple Live Births   0     0 1       Home Medications    Prior to Admission medications   Medication Sig Start Date End Date Taking? Authorizing Provider  beclomethasone (QVAR) 40 MCG/ACT inhaler Inhale 2 puffs into the lungs 2 (two) times daily.     Historical Provider, MD  diphenhydrAMINE (BENADRYL) 25 MG tablet Take 1 tablet (25 mg total) by mouth every 8 (eight) hours as needed. 03/15/17   Hope Orlene OchM Neese, NP  ibuprofen (ADVIL,MOTRIN) 600 MG tablet Take 1 tablet (600 mg total) by mouth every 6 (six) hours. 05/30/15   Federico FlakeKimberly Niles Newton, MD  metroNIDAZOLE (FLAGYL) 500 MG tablet Take 1 tablet (500 mg total) by mouth 2 (two) times daily. 03/01/17   Cristina GongElizabeth W Hammond, PA-C  ondansetron (ZOFRAN ODT) 4 MG disintegrating tablet Take 1 tablet (4 mg total) by mouth every 6 (six) hours as needed for nausea or vomiting. 03/01/17   Cristina GongElizabeth W Hammond, PA-C  predniSONE (DELTASONE) 10 MG tablet Take 2 tablets (20 mg total) by mouth 2 (two) times daily with a meal. 03/15/17   Hope Orlene OchM Neese, NP  sulfamethoxazole-trimethoprim (BACTRIM DS,SEPTRA DS) 800-160 MG tablet Take 1 tablet by mouth 2 (two) times daily. 03/15/17 03/22/17  Hope Orlene OchM Neese, NP  sulfamethoxazole-trimethoprim (BACTRIM DS,SEPTRA DS) 800-160 MG tablet Take 1 tablet by mouth 2 (two) times daily. 03/17/17 03/24/17  Rise MuKenneth T Jamala Kohen, PA-C    Family History Family History  Problem Relation Age of Onset  . Diabetes Father   . Diabetes Maternal Grandmother   . Stroke Maternal Grandmother     TIA  . Cancer Maternal Grandfather   .  Cancer Maternal Aunt   . Stroke Maternal Aunt   . Alcohol abuse Neg Hx   . Arthritis Neg Hx   . Asthma Neg Hx   . Birth defects Neg Hx   . COPD Neg Hx   . Depression Neg Hx   . Drug abuse Neg Hx   . Early death Neg Hx   . Hearing loss Neg Hx   . Heart disease Neg Hx   . Hyperlipidemia Neg Hx   . Hypertension Neg Hx   . Kidney disease Neg Hx   . Learning disabilities Neg Hx   . Mental illness Neg Hx   . Mental retardation Neg Hx   . Miscarriages / Stillbirths Neg Hx   . Vision loss Neg Hx   . Varicose Veins Neg Hx     Social History Social History  Substance Use Topics  . Smoking status: Former Smoker    Packs/day: 0.50    Quit date: 11/17/2012  .  Smokeless tobacco: Never Used  . Alcohol use Yes     Comment: occational     Allergies   Bee venom; Onion; and Pork-derived products   Review of Systems Review of Systems Constitutional: Negative for chills, fatigue and fever.  Gastrointestinal: Negative for abdominal pain, anorexia, nausea and vomiting.  Musculoskeletal: Positive for myalgias.  Skin: Positive for color change and wound.  Neurological: Negative for headaches.  All other systems reviewed and are negative.  Physical Exam Updated Vital Signs BP 113/76   Pulse 96   Temp 98.7 F (37.1 C) (Oral)   Resp 18   Ht 5\' 2"  (1.575 m)   Wt 59 kg   SpO2 96%   BMI 23.78 kg/m   Physical Exam Constitutional: She is oriented to person, place, and time. She appears well-developed and well-nourished. No distress.  HENT:  Head: Normocephalic and atraumatic.  Eyes: Conjunctivae and EOM are normal.  Neck: Neck supple. No tracheal deviation present.  Cardiovascular: Normal rate.   Pulmonary/Chest: Effort normal. No respiratory distress.  Musculoskeletal: Normal range of motion.  Neurological: She is alert and oriented to person, place, and time.  Skin: Skin is warm and dry. There is erythema.  Left thigh: 3 cm raised, slightly erythematous area with increased warmth and a dark center that appears to be an insect bite. No red streaking. surrounding cellulitis noted, no purulent drainage.  Psychiatric: She has a normal mood and affect. Her behavior is normal.  Nursing note and vitals reviewed.     ED Treatments / Results  Labs (all labs ordered are listed, but only abnormal results are displayed) Labs Reviewed  POC URINE PREG, ED    EKG  EKG Interpretation None       Radiology No results found.  Procedures Procedures (including critical care time)  Medications Ordered in ED Medications  ketorolac (TORADOL) 30 MG/ML injection 30 mg (30 mg Intramuscular Given 03/17/17 1509)  cefTRIAXone (ROCEPHIN) injection  1 g (1 g Intramuscular Given 03/17/17 1508)  lidocaine (PF) (XYLOCAINE) 1 % injection (5 mLs  Given 03/17/17 1509)     Initial Impression / Assessment and Plan / ED Course  I have reviewed the triage vital signs and the nursing notes.  Pertinent labs & imaging results that were available during my care of the patient were reviewed by me and considered in my medical decision making (see chart for details).     Patient presentation consistent with cellulitis. I performed a bedside ultrasound that revealed no drainable abscess. Patient  did not start her antibiotics that she was prescribed 2 days ago. Informed patient this is $4 at [facility]. Afebrile. No tachycardia, hypotension or other symptoms suggestive of severe infection. Area has been demarcated and pt advised to follow up for wound check in 2-3 days, sooner for worsening systemic symptoms, new lymphangitis, or significant spread of erythema past line of demarcation. Given shot of Toradol and Keflex in the ED. Will discharge with bactrim. Return precautions discussed. Pt appears safe for discharge.    Final Clinical Impressions(s) / ED Diagnoses   Final diagnoses:  Cellulitis of left lower extremity    New Prescriptions Discharge Medication List as of 03/17/2017  3:04 PM    START taking these medications   Details  !! sulfamethoxazole-trimethoprim (BACTRIM DS,SEPTRA DS) 800-160 MG tablet Take 1 tablet by mouth 2 (two) times daily., Starting Thu 03/17/2017, Until Thu 03/24/2017, Print     !! - Potential duplicate medications found. Please discuss with provider.       Rise Mu, PA-C 03/17/17 1546    Raeford Razor, MD 03/21/17 [phone]

## 2017-03-17 NOTE — ED Notes (Signed)
Discharge vitals taken. Awaiting discharge.

## 2017-03-17 NOTE — ED Notes (Signed)
Pt walked to restroom. Obtaining urine preg.

## 2017-03-17 NOTE — Discharge Instructions (Signed)
This is likely cellulitis. Make sure you are taking the antibiotics as prescribed. If the redness worsens outside of the area marked please return the ED. Will need follow-up in 3-4 days with a wound recheck or sooner if he develops any worsening symptoms. May take Motrin and Tylenol for pain. Warm compresses.

## 2017-09-04 ENCOUNTER — Inpatient Hospital Stay (HOSPITAL_COMMUNITY): Payer: Medicaid Other

## 2017-09-04 ENCOUNTER — Encounter (HOSPITAL_COMMUNITY): Payer: Self-pay | Admitting: *Deleted

## 2017-09-04 ENCOUNTER — Inpatient Hospital Stay (HOSPITAL_COMMUNITY)
Admission: AD | Admit: 2017-09-04 | Discharge: 2017-09-04 | Disposition: A | Discharge status: Home / Self Care | Payer: Medicaid Other | Source: Ambulatory Visit | Attending: Obstetrics and Gynecology | Admitting: Obstetrics and Gynecology

## 2017-09-04 ENCOUNTER — Telehealth: Payer: Self-pay | Admitting: Advanced Practice Midwife

## 2017-09-04 DIAGNOSIS — O99331 Smoking (tobacco) complicating pregnancy, first trimester: Secondary | ICD-10-CM | POA: Insufficient documentation

## 2017-09-04 DIAGNOSIS — F17210 Nicotine dependence, cigarettes, uncomplicated: Secondary | ICD-10-CM | POA: Diagnosis not present

## 2017-09-04 DIAGNOSIS — Z3A01 Less than 8 weeks gestation of pregnancy: Secondary | ICD-10-CM | POA: Diagnosis not present

## 2017-09-04 DIAGNOSIS — O3680X0 Pregnancy with inconclusive fetal viability, not applicable or unspecified: Secondary | ICD-10-CM

## 2017-09-04 DIAGNOSIS — O26891 Other specified pregnancy related conditions, first trimester: Secondary | ICD-10-CM

## 2017-09-04 DIAGNOSIS — R109 Unspecified abdominal pain: Secondary | ICD-10-CM

## 2017-09-04 DIAGNOSIS — R1030 Lower abdominal pain, unspecified: Secondary | ICD-10-CM | POA: Insufficient documentation

## 2017-09-04 LAB — CBC
HCT: 36.9 % (ref 36.0–46.0)
Hemoglobin: 12.3 g/dL (ref 12.0–15.0)
MCH: 29.5 pg (ref 26.0–34.0)
MCHC: 33.3 g/dL (ref 30.0–36.0)
MCV: 88.5 fL (ref 78.0–100.0)
PLATELETS: 299 10*3/uL (ref 150–400)
RBC: 4.17 MIL/uL (ref 3.87–5.11)
RDW: 14 % (ref 11.5–15.5)
WBC: 10 10*3/uL (ref 4.0–10.5)

## 2017-09-04 LAB — URINALYSIS, ROUTINE W REFLEX MICROSCOPIC
BACTERIA UA: NONE SEEN
Bilirubin Urine: NEGATIVE
Glucose, UA: NEGATIVE mg/dL
Hgb urine dipstick: NEGATIVE
KETONES UR: NEGATIVE mg/dL
Nitrite: NEGATIVE
PROTEIN: NEGATIVE mg/dL
Specific Gravity, Urine: 1.006 (ref 1.005–1.030)
pH: 8 (ref 5.0–8.0)

## 2017-09-04 LAB — POCT PREGNANCY, URINE: Preg Test, Ur: POSITIVE — AB

## 2017-09-04 LAB — WET PREP, GENITAL
Sperm: NONE SEEN
TRICH WET PREP: NONE SEEN
Yeast Wet Prep HPF POC: NONE SEEN

## 2017-09-04 LAB — HCG, QUANTITATIVE, PREGNANCY: HCG, BETA CHAIN, QUANT, S: 1755 m[IU]/mL — AB (ref <5)

## 2017-09-04 LAB — HIV ANTIBODY (ROUTINE TESTING W REFLEX): HIV Screen 4th Generation wRfx: NONREACTIVE

## 2017-09-04 MED ORDER — METRONIDAZOLE 500 MG PO TABS: 500.0000 mg | ORAL_TABLET | Freq: Two times a day (BID) | ORAL | 0 refills | Status: DC

## 2017-09-04 MED ORDER — CONCEPT OB 130-92.4-1 MG PO CAPS: 1.0000 | ORAL_CAPSULE | Freq: Every day | ORAL | 12 refills | Status: DC

## 2017-09-04 NOTE — MAU Provider Note (Signed)
Chief Complaint: Abdominal Pain and Back Pain  First Provider Initiated Contact with Patient 09/04/17 0400      SUBJECTIVE HPI: Brittany Bowers is a 21 y.o. G2P1001 at [redacted]w[redacted]d who presents to Maternity Admissions reporting upper and low abd pain and low back pain for ~1 week. Pos home UPT. No other testing this pregnancy.   Vaginal Bleeding: None Passage of tissue or clots: None Dizziness: None  O POS  Pain Location: Upper and low abd, low back Quality: sharp, cramping Severity: 4/10 on pain scale Duration: 1+ weeks Course: unchanged Context: early pregnancy, IUP not verified Timing: intermittent Modifying factors: Worse w/ position changes Associated signs and symptoms: Pos for malodorous discharge  Past Medical History:  Diagnosis Date  . Asthma    uses inhaler twice/day   OB History  Gravida Para Term Preterm AB Living  2 1 1    0 1  SAB TAB Ectopic Multiple Live Births  0     0 1    # Outcome Date GA Lbr Len/2nd Weight Sex Delivery Anes PTL Lv  2 Current           1 Term 05/29/15 [redacted]w[redacted]d 23:54 / 00:18 5 lb 14.5 oz (2.679 kg) F Vag-Spont EPI  LIV     Past Surgical History:  Procedure Laterality Date  . NO PAST SURGERIES     Social History   Social History  . Marital status: Single    Spouse name: N/A  . Number of children: N/A  . Years of education: N/A   Occupational History  . Not on file.   Social History Main Topics  . Smoking status: Current Some Day Smoker    Packs/day: 0.50    Last attempt to quit: 11/17/2012  . Smokeless tobacco: Never Used  . Alcohol use Yes     Comment: occational  . Drug use: Yes    Types: Marijuana     Comment: last smoked Sat night  . Sexual activity: Yes    Birth control/ protection: None   Other Topics Concern  . Not on file   Social History Narrative  . No narrative on file   No current facility-administered medications on file prior to encounter.    Current Outpatient Prescriptions on File Prior to Encounter   Medication Sig Dispense Refill  . beclomethasone (QVAR) 40 MCG/ACT inhaler Inhale 2 puffs into the lungs 2 (two) times daily.    . diphenhydrAMINE (BENADRYL) 25 MG tablet Take 1 tablet (25 mg total) by mouth every 8 (eight) hours as needed. 20 tablet 0  . ibuprofen (ADVIL,MOTRIN) 600 MG tablet Take 1 tablet (600 mg total) by mouth every 6 (six) hours. 30 tablet 0  . metroNIDAZOLE (FLAGYL) 500 MG tablet Take 1 tablet (500 mg total) by mouth 2 (two) times daily. 14 tablet 0  . ondansetron (ZOFRAN ODT) 4 MG disintegrating tablet Take 1 tablet (4 mg total) by mouth every 6 (six) hours as needed for nausea or vomiting. 10 tablet 0  . predniSONE (DELTASONE) 10 MG tablet Take 2 tablets (20 mg total) by mouth 2 (two) times daily with a meal. 12 tablet 0   Allergies  Allergen Reactions  . Bee Venom Swelling  . Onion Other (See Comments)    Upset stomach  . Pork-Derived Products Other (See Comments)    Religious preference    I have reviewed the past Medical Hx, Surgical Hx, Social Hx, Allergies and Medications.   Review of Systems  Constitutional: Negative for chills  and fever.  Gastrointestinal: Positive for abdominal pain. Negative for blood in stool, constipation, diarrhea, nausea and vomiting.  Genitourinary: Negative for dysuria, flank pain, frequency and hematuria.    OBJECTIVE Patient Vitals for the past 24 hrs:  BP Temp Pulse Resp Height Weight  09/04/17 0511 114/75 98.3 F (36.8 C) 79 18 - -  09/04/17 0307 123/67 98.6 F (37 C) 98 18 5\' 2"  (1.575 m) 177 lb (80.3 kg)   Constitutional: Well-developed, well-nourished female in no acute distress.  Cardiovascular: normal rate Respiratory: normal rate and effort.  GI: Abd soft, non-tender.  MS: Extremities nontender, no edema, normal ROM Neurologic: Alert and oriented x 4.  GU: Neg CVAT.  SPECULUM EXAM: NEFG, moderate amount of thin, white malodorous discharge, no blood noted, cervix clean  BIMANUAL: cervix closed; uterus normal  size, no adnexal tenderness or masses. No CMT.  LAB RESULTS Results for orders placed or performed during the hospital encounter of 09/04/17 (from the past 24 hour(s))  Urinalysis, Routine w reflex microscopic     Status: Abnormal   Collection Time: 09/04/17  3:05 AM  Result Value Ref Range   Color, Urine STRAW (A) YELLOW   APPearance HAZY (A) CLEAR   Specific Gravity, Urine 1.006 1.005 - 1.030   pH 8.0 5.0 - 8.0   Glucose, UA NEGATIVE NEGATIVE mg/dL   Hgb urine dipstick NEGATIVE NEGATIVE   Bilirubin Urine NEGATIVE NEGATIVE   Ketones, ur NEGATIVE NEGATIVE mg/dL   Protein, ur NEGATIVE NEGATIVE mg/dL   Nitrite NEGATIVE NEGATIVE   Leukocytes, UA TRACE (A) NEGATIVE   RBC / HPF 0-5 0 - 5 RBC/hpf   WBC, UA 0-5 0 - 5 WBC/hpf   Bacteria, UA NONE SEEN NONE SEEN   Squamous Epithelial / LPF 0-5 (A) NONE SEEN  Pregnancy, urine POC     Status: Abnormal   Collection Time: 09/04/17  3:16 AM  Result Value Ref Range   Preg Test, Ur POSITIVE (A) NEGATIVE  hCG, quantitative, pregnancy     Status: Abnormal   Collection Time: 09/04/17  3:27 AM  Result Value Ref Range   hCG, Beta Chain, Quant, S 1,755 (H) <5 mIU/mL  CBC     Status: None   Collection Time: 09/04/17  3:27 AM  Result Value Ref Range   WBC 10.0 4.0 - 10.5 K/uL   RBC 4.17 3.87 - 5.11 MIL/uL   Hemoglobin 12.3 12.0 - 15.0 g/dL   HCT 16.1 09.6 - 04.5 %   MCV 88.5 78.0 - 100.0 fL   MCH 29.5 26.0 - 34.0 pg   MCHC 33.3 30.0 - 36.0 g/dL   RDW 40.9 81.1 - 91.4 %   Platelets 299 150 - 400 K/uL  Wet prep, genital     Status: Abnormal   Collection Time: 09/04/17  4:00 AM  Result Value Ref Range   Yeast Wet Prep HPF POC NONE SEEN NONE SEEN   Trich, Wet Prep NONE SEEN NONE SEEN   Clue Cells Wet Prep HPF POC PRESENT (A) NONE SEEN   WBC, Wet Prep HPF POC FEW (A) NONE SEEN   Sperm NONE SEEN     IMAGING US Ob Comp Less 14 Wks  Result Date: 09/04/2017 CLINICAL DATA:  Abdominal pain. Quantitative beta HCG is 1,755. Estimated gestational  age by LMP is 5 weeks 1 day. EXAM: OBSTETRIC <14 WK [place] AND TRANSVAGINAL OB US TECHNIQUE: Both transabdominal and transvaginal ultrasound examinations were performed for complete evaluation of the gestation as well as the maternal  uterus, adnexal regions, and pelvic cul-de-sac. Transvaginal technique was performed to assess early pregnancy. COMPARISON:  None. FINDINGS: Intrauterine gestational sac: A single intrauterine gestational sac is visualized. Yolk sac:  Not Visualized. Embryo:  Not Visualized. Cardiac Activity: Not Visualized. MSD: 4  mm   5 w   1  d Subchorionic hemorrhage:  None visualized. Maternal uterus/adnexae: Uterus appears retroverted. No myometrial mass lesions identified. Both ovaries are visualized and appear normal. No abnormal adnexal masses. Small amount of free fluid is seen in the pelvis. IMPRESSION: Probable early intrauterine gestational sac, but no yolk sac, fetal pole, or cardiac activity yet visualized. Recommend follow-up quantitative B-HCG levels and follow-up US in 14 days to assess viability. This recommendation follows SRU consensus guidelines: Diagnostic Criteria for Nonviable Pregnancy Early in the First Trimester. Malva Limes Engl J Med 2013; 578:4696-29; 369:1443-51. Electronically Signed   By: Burman NievesWilliam  Stevens M.D.   On: 09/04/2017 04:54   [place] Ob Transvaginal  Result Date: 09/04/2017 CLINICAL DATA:  Abdominal pain. Quantitative beta HCG is 1,755. Estimated gestational age by LMP is 5 weeks 1 day. EXAM: OBSTETRIC <14 WK US AND TRANSVAGINAL OB US TECHNIQUE: Both transabdominal and transvaginal ultrasound examinations were performed for complete evaluation of the gestation as well as the maternal uterus, adnexal regions, and pelvic cul-de-sac. Transvaginal technique was performed to assess early pregnancy. COMPARISON:  None. FINDINGS: Intrauterine gestational sac: A single intrauterine gestational sac is visualized. Yolk sac:  Not Visualized. Embryo:  Not Visualized. Cardiac Activity: Not  Visualized. MSD: 4  mm   5 w   1  d Subchorionic hemorrhage:  None visualized. Maternal uterus/adnexae: Uterus appears retroverted. No myometrial mass lesions identified. Both ovaries are visualized and appear normal. No abnormal adnexal masses. Small amount of free fluid is seen in the pelvis. IMPRESSION: Probable early intrauterine gestational sac, but no yolk sac, fetal pole, or cardiac activity yet visualized. Recommend follow-up quantitative B-HCG levels and follow-up US in 14 days to assess viability. This recommendation follows SRU consensus guidelines: Diagnostic Criteria for Nonviable Pregnancy Early in the First Trimester. Malva Limes Engl J Med 2013; 528:4132-44; 369:1443-51. Electronically Signed   By: Burman NievesWilliam  Stevens M.D.   On: 09/04/2017 04:54    MAU COURSE CBC, Quant, ABO/Rh, ultrasound, wet prep and GC/chlamydia culture, UA  MDM Pain and bleeding in early pregnancy with pregnancy of unknown anatomic location, but hemodynamically stable.  ASSESSMENT 1. Pregnancy of unknown anatomic location   2. Abdominal pain during pregnancy in first trimester     PLAN Discharge home in stable condition. Ectopic and SAB precautions Rx Flagyl and PNVs. Follow-up Information    [facility]2017.   Specialty:  Obstetrics and Gynecology Why:  for repeat bloodwork Contact information: [address] [place] [phone]       [facility]ERN[facility] ADMISSIONS Follow up.   Why:  as needed if low abdominal pain worsens or bleeding occurs Contact information: [address] [id number] [place] [id number] [phone]         Allergies as of 09/04/2017      Reactions   Bee Venom Swelling   Onion Other (See Comments)   Upset stomach   Pork-derived Products Other (See Comments)   Religious preference      Medication List    STOP taking these medications   beclomethasone 40 MCG/ACT  inhaler Commonly known as:  QVAR   ibuprofen 600 MG tablet Commonly known as:  ADVIL,MOTRIN  ondansetron 4 MG disintegrating tablet Commonly known as:  ZOFRAN ODT   predniSONE 10 MG tablet Commonly known as:  DELTASONE     TAKE these medications   CONCEPT OB 130-92.4-1 MG Caps Take 1 tablet by mouth daily.   diphenhydrAMINE 25 MG tablet Commonly known as:  BENADRYL Take 1 tablet (25 mg total) by mouth every 8 (eight) hours as needed.   metroNIDAZOLE 500 MG tablet Commonly known as:  FLAGYL Take 1 tablet (500 mg total) by mouth 2 (two) times daily.        Katrinka Blazing, [place], CNM 09/04/2017  5:48 AM  4

## 2017-09-04 NOTE — MAU Note (Signed)
Pt walked out of room and stated she needed to go as she has to be at work at 0700. Pt walked out of MAU. Ivonne AndrewV. Smith CNM notified

## 2017-09-04 NOTE — Telephone Encounter (Signed)
Pt left MAU prior to receiving results. Called to notify pt of pregnancy of unknown anatomic location. Needs F/U quant in 2 days on 09/06/17 at [facility] at 11:00. Ectopic precautions. Also informed her of Dx BV, Rx Flagyl. Pt verbalized understanding.   Katrinka BlazingSmith, [place], CNM 09/04/2017 5:53 AM

## 2017-09-04 NOTE — Discharge Instructions (Signed)
Abdominal Pain During Pregnancy Abdominal pain is common in pregnancy. Most of the time, it does not cause harm. There are many causes of abdominal pain. Some causes are more serious than others and sometimes the cause is not known. Abdominal pain can be a sign that something is very wrong with the pregnancy or the pain may have nothing to do with the pregnancy. Always tell your health care provider if you have any abdominal pain. Follow these instructions at home:  Do not have sex or put anything in your vagina until your symptoms go away completely.  Watch your abdominal pain for any changes.  Get plenty of rest until your pain improves.  Drink enough fluid to keep your urine clear or pale yellow.  Take over-the-counter or prescription medicines only as told by your health care provider.  Keep all follow-up visits as told by your health care provider. This is important. Contact a health care provider if:  You have a fever.  Your pain gets worse or you have cramping.  Your pain continues after resting. Get help right away if:  You are bleeding, leaking fluid, or passing tissue from the vagina.  You have vomiting or diarrhea that does not go away.  You have painful or bloody urination.  You notice a decrease in your baby's movements.  You feel very weak or faint.  You have shortness of breath.  You develop a severe headache with abdominal pain.  You have abnormal vaginal discharge with abdominal pain. This information is not intended to replace advice given to you by your health care provider. Make sure you discuss any questions you have with your health care provider. Document Released: 11/01/2005 Document Revised: 08/12/2016 Document Reviewed: 05/31/2013 Elsevier Interactive Patient Education  2018 [employer]   Bacterial Vaginosis Bacterial vaginosis is a vaginal infection that occurs when the normal balance of bacteria in the vagina is disrupted. It results from an  overgrowth of certain bacteria. This is the most common vaginal infection among women ages 9515-44. Because bacterial vaginosis increases your risk for STIs (sexually transmitted infections), getting treated can help reduce your risk for chlamydia, gonorrhea, herpes, and HIV (human immunodeficiency virus). Treatment is also important for preventing complications in pregnant women, because this condition can cause an early (premature) delivery. What are the causes? This condition is caused by an increase in harmful bacteria that are normally present in small amounts in the vagina. However, the reason that the condition develops is not fully understood. What increases the risk? The following factors may make you more likely to develop this condition:  Having a new sexual partner or multiple sexual partners.  Having unprotected sex.  Douching.  Having an intrauterine device (IUD).  Smoking.  Drug and alcohol abuse.  Taking certain antibiotic medicines.  Being pregnant.  You cannot get bacterial vaginosis from toilet seats, bedding, swimming pools, or contact with objects around you. What are the signs or symptoms? Symptoms of this condition include:  Grey or white vaginal discharge. The discharge can also be watery or foamy.  A fish-like odor with discharge, especially after sexual intercourse or during menstruation.  Itching in and around the vagina.  Burning or pain with urination.  Some women with bacterial vaginosis have no signs or symptoms. How is this diagnosed? This condition is diagnosed based on:  Your medical history.  A physical exam of the vagina.  Testing a sample of vaginal fluid under a microscope to look for a large amount of bad  bacteria or abnormal cells. Your health care provider may use a cotton swab or a small wooden spatula to collect the sample.  How is this treated? This condition is treated with antibiotics. These may be given as a pill, a vaginal  cream, or a medicine that is put into the vagina (suppository). If the condition comes back after treatment, a second round of antibiotics may be needed. Follow these instructions at home: Medicines  Take over-the-counter and prescription medicines only as told by your health care provider.  Take or use your antibiotic as told by your health care provider. Do not stop taking or using the antibiotic even if you start to feel better. General instructions  If you have a female sexual partner, tell her that you have a vaginal infection. She should see her health care provider and be treated if she has symptoms. If you have a female sexual partner, he does not need treatment.  During treatment: ? Avoid sexual activity until you finish treatment. ? Do not douche. ? Avoid alcohol as directed by your health care provider. ? Avoid breastfeeding as directed by your health care provider.  Drink enough water and fluids to keep your urine clear or pale yellow.  Keep the area around your vagina and rectum clean. ? Wash the area daily with warm water. ? Wipe yourself from front to back after using the toilet.  Keep all follow-up visits as told by your health care provider. This is important. How is this prevented?  Do not douche.  Wash the outside of your vagina with warm water only.  Use protection when having sex. This includes latex condoms and dental dams.  Limit how many sexual partners you have. To help prevent bacterial vaginosis, it is best to have sex with just one partner (monogamous).  Make sure you and your sexual partner are tested for STIs.  Wear cotton or cotton-lined underwear.  Avoid wearing tight pants and pantyhose, especially during summer.  Limit the amount of alcohol that you drink.  Do not use any products that contain nicotine or tobacco, such as cigarettes and e-cigarettes. If you need help quitting, ask your health care provider.  Do not use illegal drugs. Where  to find more information:  Centers for Disease Control and Prevention: SolutionApps.co.za  American Sexual Health Association (ASHA): www.ashastd.org  U.S. Department of Health and Health and safety inspector, Office on Women's Health: ConventionalMedicines.si or http://www.anderson-williamson.info/ Contact a health care provider if:  Your symptoms do not improve, even after treatment.  You have more discharge or pain when urinating.  You have a fever.  You have pain in your abdomen.  You have pain during sex.  You have vaginal bleeding between periods. Summary  Bacterial vaginosis is a vaginal infection that occurs when the normal balance of bacteria in the vagina is disrupted.  Because bacterial vaginosis increases your risk for STIs (sexually transmitted infections), getting treated can help reduce your risk for chlamydia, gonorrhea, herpes, and HIV (human immunodeficiency virus). Treatment is also important for preventing complications in pregnant women, because the condition can cause an early (premature) delivery.  This condition is treated with antibiotic medicines. These may be given as a pill, a vaginal cream, or a medicine that is put into the vagina (suppository). This information is not intended to replace advice given to you by your health care provider. Make sure you discuss any questions you have with your health care provider. Document Released: 11/01/2005 Document Revised: 07/17/2016 Document Reviewed: 07/17/2016 Elsevier Interactive  Patient Education  2017 [employer].

## 2017-09-04 NOTE — MAU Note (Signed)
Pt reports she started having abd pain and back pain about a week ago. They feel sharp now. Denies any vaginla bleeding or discharge. Had a Positive HPT last week.

## 2017-09-05 LAB — GC/CHLAMYDIA PROBE AMP (~~LOC~~) NOT AT ARMC
Chlamydia: NEGATIVE
NEISSERIA GONORRHEA: NEGATIVE

## 2017-09-06 ENCOUNTER — Ambulatory Visit: Payer: Medicaid Other | Admitting: General Practice

## 2017-09-06 DIAGNOSIS — O3680X0 Pregnancy with inconclusive fetal viability, not applicable or unspecified: Secondary | ICD-10-CM

## 2017-09-06 LAB — HCG, QUANTITATIVE, PREGNANCY: HCG, BETA CHAIN, QUANT, S: 3783 m[IU]/mL — AB (ref <5)

## 2017-09-06 NOTE — Progress Notes (Signed)
Sharen CounterLisa Leftwich-Kirby, CNM reviewed results.  Provider recommendation that hcg levels are appropriately rising and to schedule a follow up US in one week.  OB US scheduled on October 30th @ 1300.  Pt notified.

## 2017-09-06 NOTE — Progress Notes (Signed)
Patient here for stat bhcg today. Patient denies bleeding or pain. Discussed with patient having her wait in lobby for results/updated plan of care. Patient verbalized understanding

## 2017-09-13 ENCOUNTER — Encounter: Payer: Self-pay | Admitting: Family Medicine

## 2017-09-13 ENCOUNTER — Ambulatory Visit (HOSPITAL_COMMUNITY): Payer: Medicaid Other

## 2017-09-13 ENCOUNTER — Ambulatory Visit: Payer: Medicaid Other | Admitting: *Deleted

## 2017-09-13 ENCOUNTER — Ambulatory Visit (HOSPITAL_COMMUNITY)
Admission: RE | Admit: 2017-09-13 | Discharge: 2017-09-13 | Disposition: A | Discharge status: Home / Self Care | Payer: Medicaid Other | Source: Ambulatory Visit | Attending: Advanced Practice Midwife | Admitting: Advanced Practice Midwife

## 2017-09-13 DIAGNOSIS — Z3A01 Less than 8 weeks gestation of pregnancy: Secondary | ICD-10-CM | POA: Insufficient documentation

## 2017-09-13 DIAGNOSIS — O208 Other hemorrhage in early pregnancy: Secondary | ICD-10-CM | POA: Insufficient documentation

## 2017-09-13 DIAGNOSIS — Z712 Person consulting for explanation of examination or test findings: Secondary | ICD-10-CM

## 2017-09-13 DIAGNOSIS — O3680X0 Pregnancy with inconclusive fetal viability, not applicable or unspecified: Secondary | ICD-10-CM

## 2017-09-13 DIAGNOSIS — Z3490 Encounter for supervision of normal pregnancy, unspecified, unspecified trimester: Secondary | ICD-10-CM | POA: Diagnosis present

## 2017-09-13 NOTE — Progress Notes (Signed)
US results reviewed by Venia CarbonJennifer Rasch, NP.  Pt informed of results and that she may now schedule prenatal care at office of her choice.  Pt was also advised to return to hospital if she should have heavy vaginal bleeding or severe abdominal pain or cramping.  Pt voiced understanding.

## 2017-09-13 NOTE — Progress Notes (Signed)
Reviewed US with Diane Day who will speak to patient.  Ok for patient to start prenatal care.  Venia Carbonasch, Marshall Kampf I, NP 09/13/2017 3:15 PM

## 2017-10-17 ENCOUNTER — Encounter (HOSPITAL_COMMUNITY): Payer: Self-pay | Admitting: *Deleted

## 2017-10-17 ENCOUNTER — Inpatient Hospital Stay (HOSPITAL_COMMUNITY)
Admission: AD | Admit: 2017-10-17 | Discharge: 2017-10-17 | Disposition: A | Discharge status: Home / Self Care | Payer: Medicaid Other | Source: Ambulatory Visit | Attending: Family Medicine | Admitting: Family Medicine

## 2017-10-17 DIAGNOSIS — Z87891 Personal history of nicotine dependence: Secondary | ICD-10-CM | POA: Diagnosis not present

## 2017-10-17 DIAGNOSIS — R109 Unspecified abdominal pain: Secondary | ICD-10-CM | POA: Diagnosis not present

## 2017-10-17 DIAGNOSIS — O218 Other vomiting complicating pregnancy: Secondary | ICD-10-CM | POA: Diagnosis not present

## 2017-10-17 DIAGNOSIS — O26891 Other specified pregnancy related conditions, first trimester: Secondary | ICD-10-CM | POA: Diagnosis present

## 2017-10-17 DIAGNOSIS — O219 Vomiting of pregnancy, unspecified: Secondary | ICD-10-CM | POA: Diagnosis not present

## 2017-10-17 DIAGNOSIS — Z3A11 11 weeks gestation of pregnancy: Secondary | ICD-10-CM | POA: Diagnosis not present

## 2017-10-17 DIAGNOSIS — O26899 Other specified pregnancy related conditions, unspecified trimester: Secondary | ICD-10-CM

## 2017-10-17 LAB — COMPREHENSIVE METABOLIC PANEL
ALK PHOS: 52 U/L (ref 38–126)
ALT: 47 U/L (ref 14–54)
AST: 36 U/L (ref 15–41)
Albumin: 4 g/dL (ref 3.5–5.0)
Anion gap: 7 (ref 5–15)
BUN: 9 mg/dL (ref 6–20)
CALCIUM: 9.1 mg/dL (ref 8.9–10.3)
CHLORIDE: 101 mmol/L (ref 101–111)
CO2: 24 mmol/L (ref 22–32)
CREATININE: 0.52 mg/dL (ref 0.44–1.00)
GFR calc Af Amer: >60 mL/min (ref >60)
Glucose, Bld: 104 mg/dL — ABNORMAL HIGH (ref 65–99)
Potassium: 3.9 mmol/L (ref 3.5–5.1)
SODIUM: 132 mmol/L — AB (ref 135–145)
Total Bilirubin: 0.6 mg/dL (ref 0.3–1.2)
Total Protein: 7.4 g/dL (ref 6.5–8.1)

## 2017-10-17 LAB — URINALYSIS, ROUTINE W REFLEX MICROSCOPIC
Bilirubin Urine: NEGATIVE
GLUCOSE, UA: NEGATIVE mg/dL
HGB URINE DIPSTICK: NEGATIVE
Ketones, ur: NEGATIVE mg/dL
NITRITE: NEGATIVE
PROTEIN: 30 mg/dL — AB
SPECIFIC GRAVITY, URINE: 1.028 (ref 1.005–1.030)
pH: 5 (ref 5.0–8.0)

## 2017-10-17 LAB — CBC
HCT: 36.2 % (ref 36.0–46.0)
Hemoglobin: 11.9 g/dL — ABNORMAL LOW (ref 12.0–15.0)
MCH: 29.8 pg (ref 26.0–34.0)
MCHC: 32.9 g/dL (ref 30.0–36.0)
MCV: 90.5 fL (ref 78.0–100.0)
PLATELETS: 278 10*3/uL (ref 150–400)
RBC: 4 MIL/uL (ref 3.87–5.11)
RDW: 13.9 % (ref 11.5–15.5)
WBC: 10.5 10*3/uL (ref 4.0–10.5)

## 2017-10-17 LAB — LIPASE, BLOOD: LIPASE: 24 U/L (ref 11–51)

## 2017-10-17 LAB — WET PREP, GENITAL
Clue Cells Wet Prep HPF POC: NONE SEEN
Sperm: NONE SEEN
Trich, Wet Prep: NONE SEEN
Yeast Wet Prep HPF POC: NONE SEEN

## 2017-10-17 MED ORDER — DOXYLAMINE SUCCINATE (SLEEP) 25 MG PO TABS
25.0000 mg | ORAL_TABLET | Freq: Every day | ORAL | 0 refills | Status: DC
Start: 2017-10-17 — End: 2017-11-16

## 2017-10-17 MED ORDER — VITAMIN B-6 50 MG PO TABS: 50.0000 mg | ORAL_TABLET | Freq: Three times a day (TID) | ORAL | 0 refills | Status: DC

## 2017-10-17 MED ORDER — PROMETHAZINE HCL 25 MG PO TABS: 25.0000 mg | ORAL_TABLET | Freq: Four times a day (QID) | ORAL | 0 refills | Status: DC | PRN

## 2017-10-17 MED ORDER — LACTATED RINGERS IV BOLUS (SEPSIS)
1000.0000 mL | Freq: Once | INTRAVENOUS | Status: AC
  Administered 2017-10-17: 1000 mL via INTRAVENOUS

## 2017-10-17 MED ORDER — PROMETHAZINE HCL 25 MG/ML IJ SOLN
25.0000 mg | Freq: Once | INTRAMUSCULAR | Status: AC
  Administered 2017-10-17: 25 mg via INTRAVENOUS
  Filled 2017-10-17: qty 1

## 2017-10-17 NOTE — Discharge Instructions (Signed)
For nausea and vomiting: --Start Vitamin B6 three times a day, and Unisom at bedtime. May also add 1/2 tablet of Unisom during the day for persistent symptoms. These medications are over the counter. Prescription given for reference. --Phenergan as needed for persistent symptoms    First Trimester of Pregnancy The first trimester of pregnancy is from week 1 until the end of week 13 (months 1 through 3). During this time, your baby will begin to develop inside you. At 6-8 weeks, the eyes and face are formed, and the heartbeat can be seen on ultrasound. At the end of 12 weeks, all the baby's organs are formed. Prenatal care is all the medical care you receive before the birth of your baby. Make sure you get good prenatal care and follow all of your doctor's instructions. Follow these instructions at home: Medicines  Take over-the-counter and prescription medicines only as told by your doctor. Some medicines are safe and some medicines are not safe during pregnancy.  Take a prenatal vitamin that contains at least 600 micrograms (mcg) of folic acid.  If you have trouble pooping (constipation), take medicine that will make your stool soft (stool softener) if your doctor approves. Eating and drinking  Eat regular, healthy meals.  Your doctor will tell you the amount of weight gain that is right for you.  Avoid raw meat and uncooked cheese.  If you feel sick to your stomach (nauseous) or throw up (vomit): ? Eat 4 or 5 small meals a day instead of 3 large meals. ? Try eating a few soda crackers. ? Drink liquids between meals instead of during meals.  To prevent constipation: ? Eat foods that are high in fiber, like fresh fruits and vegetables, whole grains, and beans. ? Drink enough fluids to keep your pee (urine) clear or pale yellow. Activity  Exercise only as told by your doctor. Stop exercising if you have cramps or pain in your lower belly (abdomen) or low back.  Do not exercise if it  is too hot, too humid, or if you are in a place of great height (high altitude).  Try to avoid standing for long periods of time. Move your legs often if you must stand in one place for a long time.  Avoid heavy lifting.  Wear low-heeled shoes. Sit and stand up straight.  You can have sex unless your doctor tells you not to. Relieving pain and discomfort  Wear a good support bra if your breasts are sore.  Take warm water baths (sitz baths) to soothe pain or discomfort caused by hemorrhoids. Use hemorrhoid cream if your doctor says it is okay.  Rest with your legs raised if you have leg cramps or low back pain.  If you have puffy, bulging veins (varicose veins) in your legs: ? Wear support hose or compression stockings as told by your doctor. ? Raise (elevate) your feet for 15 minutes, 3-4 times a day. ? Limit salt in your food. Prenatal care  Schedule your prenatal visits by the twelfth week of pregnancy.  Write down your questions. Take them to your prenatal visits.  Keep all your prenatal visits as told by your doctor. This is important. Safety  Wear your seat belt at all times when driving.  Make a list of emergency phone numbers. The list should include numbers for family, friends, the hospital, and police and fire departments. General instructions  Ask your doctor for a referral to a local prenatal class. Begin classes no later than  at the start of month 6 of your pregnancy.  Ask for help if you need counseling or if you need help with nutrition. Your doctor can give you advice or tell you where to go for help.  Do not use hot tubs, steam rooms, or saunas.  Do not douche or use tampons or scented sanitary pads.  Do not cross your legs for long periods of time.  Avoid all herbs and alcohol. Avoid drugs that are not approved by your doctor.  Do not use any tobacco products, including cigarettes, chewing tobacco, and electronic cigarettes. If you need help quitting,  ask your doctor. You may get counseling or other support to help you quit.  Avoid cat litter boxes and soil used by cats. These carry germs that can cause birth defects in the baby and can cause a loss of your baby (miscarriage) or stillbirth.  Visit your dentist. At home, brush your teeth with a soft toothbrush. Be gentle when you floss. Contact a doctor if:  You are dizzy.  You have mild cramps or pressure in your lower belly.  You have a nagging pain in your belly area.  You continue to feel sick to your stomach, you throw up, or you have watery poop (diarrhea).  You have a bad smelling fluid coming from your vagina.  You have pain when you pee (urinate).  You have increased puffiness (swelling) in your face, hands, legs, or ankles. Get help right away if:  You have a fever.  You are leaking fluid from your vagina.  You have spotting or bleeding from your vagina.  You have very bad belly cramping or pain.  You gain or lose weight rapidly.  You throw up blood. It may look like coffee grounds.  You are around people who have [place] measles, fifth disease, or chickenpox.  You have a very bad headache.  You have shortness of breath.  You have any kind of trauma, such as from a fall or a car accident. Summary  The first trimester of pregnancy is from week 1 until the end of week 13 (months 1 through 3).  To take care of yourself and your unborn baby, you will need to eat healthy meals, take medicines only if your doctor tells you to do so, and do activities that are safe for you and your baby.  Keep all follow-up visits as told by your doctor. This is important as your doctor will have to ensure that your baby is healthy and growing well. This information is not intended to replace advice given to you by your health care provider. Make sure you discuss any questions you have with your health care provider. Document Released: 04/19/2008 Document Revised: 11/09/2016  Document Reviewed: 11/09/2016 Elsevier Interactive Patient Education  2017 [employer]

## 2017-10-17 NOTE — MAU Provider Note (Signed)
History  CSN: [id number] Arrival date and time: 10/17/17 1129  First Provider Initiated Contact with Patient 10/17/17 1159      Chief Complaint  Patient presents with  . Abdominal Pain    HPI: Brittany Bowers is a 21 y.o. G2P1001 with IUP at [redacted]w[redacted]d who presents to maternity admissions reporting nausea, vomiting, and abdominal pain. She reports she has had nausea and vomiting which is worse in the morning. Has been able to keep fluids down, but has 3 episodes of vomiting this morning. Reports some abdominal pain as well, both lower and mid abdomen, on/off started last night. Denies reflux, heart burn, vaginal bleeding, abnormal vaginal discharge, dysuria, hematuria, urinary frequency, nausea, vomiting, diarrhea, RUQ/epigastric pain, dizziness/lighreadhess, or headache.    OB History  Gravida Para Term Preterm AB Living  2 1 1    0 1  SAB TAB Ectopic Multiple Live Births  0     0 1    # Outcome Date GA Lbr Len/2nd Weight Sex Delivery Anes PTL Lv  2 Current           1 Term 05/29/15 [redacted]w[redacted]d 23:54 / 00:18 5 lb 14.5 oz (2.679 kg) F Vag-Spont EPI  LIV     Past Medical History:  Diagnosis Date  . Asthma    uses inhaler twice/day   Past Surgical History:  Procedure Laterality Date  . NO PAST SURGERIES     Family History  Problem Relation Age of Onset  . Diabetes Father   . Diabetes Maternal Grandmother   . Stroke Maternal Grandmother        TIA  . Cancer Maternal Grandfather   . Cancer Maternal Aunt   . Stroke Maternal Aunt   . Alcohol abuse Neg Hx   . Arthritis Neg Hx   . Asthma Neg Hx   . Birth defects Neg Hx   . COPD Neg Hx   . Depression Neg Hx   . Drug abuse Neg Hx   . Early death Neg Hx   . Hearing loss Neg Hx   . Heart disease Neg Hx   . Hyperlipidemia Neg Hx   . Hypertension Neg Hx   . Kidney disease Neg Hx   . Learning disabilities Neg Hx   . Mental illness Neg Hx   . Mental retardation Neg Hx   . Miscarriages / Stillbirths Neg Hx   . Vision loss Neg Hx   .  Varicose Veins Neg Hx    Social History   Socioeconomic History  . Marital status: Single    Spouse name: Not on file  . Number of children: Not on file  . Years of education: Not on file  . Highest education level: Not on file  Social Needs  . Financial resource strain: Not on file  . Food insecurity - worry: Not on file  . Food insecurity - inability: Not on file  . Transportation needs - medical: Not on file  . Transportation needs - non-medical: Not on file  Occupational History  . Not on file  Tobacco Use  . Smoking status: Former Smoker    Packs/day: 0.50    Last attempt to quit: 09/17/2017    Years since quitting: 0.0  . Smokeless tobacco: Never Used  Substance and Sexual Activity  . Alcohol use: No    Frequency: Never    Comment: occational  . Drug use: No    Comment: last smoked Sat night  . Sexual activity: Yes    Birth  control/protection: None  Other Topics Concern  . Not on file  Social History Narrative  . Not on file   Allergies  Allergen Reactions  . Bee Venom Swelling  . Onion Other (See Comments)    Upset stomach  . Pork-Derived Products Other (See Comments)    Religious preference    Medications Prior to Admission  Medication Sig Dispense Refill Last Dose  . diphenhydrAMINE (BENADRYL) 25 MG tablet Take 1 tablet (25 mg total) by mouth every 8 (eight) hours as needed. 20 tablet 0   . metroNIDAZOLE (FLAGYL) 500 MG tablet Take 1 tablet (500 mg total) by mouth 2 (two) times daily. 14 tablet 0   . Prenat w/o A Vit-FeFum-FePo-FA (CONCEPT OB) 130-92.4-1 MG CAPS Take 1 tablet by mouth daily. 30 capsule 12     I have reviewed patient's Past Medical Hx, Surgical Hx, Family Hx, Social Hx, medications and allergies.   Review of Systems: Negative except for what is mentioned in HPI.  Physical Exam   Blood pressure 128/74, pulse 63, temperature 98.7 F (37.1 C), temperature source Oral, resp. rate 18, height 5\' 4"  (1.626 m), weight 173 lb 12 oz (78.8 kg),  last menstrual period 08/01/2017, SpO2 96 %, unknown if currently breastfeeding.  Constitutional: Well-developed, well-nourished female in no acute distress.  HENT: [place]/AT, normal oropharynx mucosa. MMM Eyes: normal conjunctivae, no scleral icterus Cardiovascular: normal rate Respiratory: normal effort GI: Abd soft, non-tender, gravid appropriate for gestational age.   Pelvic: NEFG. Normal vaginal mucosa without lesions. Cervix pink, visually closed, without lesion, scant physilogic discharge. No tenderness on bimanual exam. Cervix closed. MSK: Extremities nontender, no edema Neurologic: Alert and oriented x 4. Psych: Normal mood and affect Skin: warm and dry   FHT: 150 by doppler  MAU Course/MDM:   Nursing notes and VS reviewed. Patient seen and examined, as noted above.  Exam and FHT reasuring. Abdominal pain likely musculoskeletal from vomiting.   Labs ordered. IV fluids and phenergan given.   Results reviewed:  Results for orders placed or performed during the hospital encounter of 10/17/17  Wet prep, genital  Result Value Ref Range   Yeast Wet Prep HPF POC NONE SEEN NONE SEEN   Trich, Wet Prep NONE SEEN NONE SEEN   Clue Cells Wet Prep HPF POC NONE SEEN NONE SEEN   WBC, Wet Prep HPF POC MANY (A) NONE SEEN   Sperm NONE SEEN   Urinalysis, Routine w reflex microscopic  Result Value Ref Range   Color, Urine YELLOW YELLOW   APPearance CLEAR CLEAR   Specific Gravity, Urine 1.028 1.005 - 1.030   pH 5.0 5.0 - 8.0   Glucose, UA NEGATIVE NEGATIVE mg/dL   Hgb urine dipstick NEGATIVE NEGATIVE   Bilirubin Urine NEGATIVE NEGATIVE   Ketones, ur NEGATIVE NEGATIVE mg/dL   Protein, ur 30 (A) NEGATIVE mg/dL   Nitrite NEGATIVE NEGATIVE   Leukocytes, UA SMALL (A) NEGATIVE   RBC / HPF 0-5 0 - 5 RBC/hpf   WBC, UA 0-5 0 - 5 WBC/hpf   Bacteria, UA RARE (A) NONE SEEN   Squamous Epithelial / LPF 6-30 (A) NONE SEEN   Mucus PRESENT   Comprehensive metabolic panel  Result Value Ref Range    Sodium 132 (L) 135 - 145 mmol/L   Potassium 3.9 3.5 - 5.1 mmol/L   Chloride 101 101 - 111 mmol/L   CO2 24 22 - 32 mmol/L   Glucose, Bld 104 (H) 65 - 99 mg/dL   BUN 9 6 -  20 mg/dL   Creatinine, Ser 1.610.52 0.44 - 1.00 mg/dL   Calcium 9.1 8.9 - 09.610.3 mg/dL   Total Protein 7.4 6.5 - 8.1 g/dL   Albumin 4.0 3.5 - 5.0 g/dL   AST 36 15 - 41 U/L   ALT 47 14 - 54 U/L   Alkaline Phosphatase 52 38 - 126 U/L   Total Bilirubin 0.6 0.3 - 1.2 mg/dL   GFR calc non Af Amer >60 >60 mL/min   GFR calc Af Amer >60 >60 mL/min   Anion gap 7 5 - 15  CBC  Result Value Ref Range   WBC 10.5 4.0 - 10.5 K/uL   RBC 4.00 3.87 - 5.11 MIL/uL   Hemoglobin 11.9 (L) 12.0 - 15.0 g/dL   HCT 04.536.2 40.936.0 - 81.146.0 %   MCV 90.5 78.0 - 100.0 fL   MCH 29.8 26.0 - 34.0 pg   MCHC 32.9 30.0 - 36.0 g/dL   RDW 91.413.9 78.211.5 - 95.615.5 %   Platelets 278 150 - 400 K/uL  Lipase, blood  Result Value Ref Range   Lipase 24 11 - 51 U/L   Labs wnl.  Pt feeling better after IV meds/fluids  Assessment and Plan  Assessment: 1. Nausea and vomiting of pregnancy, antepartum   2. Abdominal pain affecting pregnancy     Plan: --Discharge home in stable condition.  --Start Vitamin B6 and Unisom --Phenergan prn --Return precautions.   Degele, Kandra NicolasJulie P, MD 10/17/2017 11:59 AM

## 2017-10-18 LAB — GC/CHLAMYDIA PROBE AMP (~~LOC~~) NOT AT ARMC
Chlamydia: NEGATIVE
NEISSERIA GONORRHEA: NEGATIVE

## 2017-10-31 ENCOUNTER — Encounter: Payer: Self-pay | Admitting: Obstetrics and Gynecology

## 2017-10-31 ENCOUNTER — Other Ambulatory Visit: Payer: Self-pay | Admitting: Advanced Practice Midwife

## 2017-10-31 ENCOUNTER — Other Ambulatory Visit (HOSPITAL_COMMUNITY)
Admission: RE | Admit: 2017-10-31 | Discharge: 2017-10-31 | Disposition: A | Discharge status: Home / Self Care | Payer: Medicaid Other | Source: Ambulatory Visit | Attending: Advanced Practice Midwife | Admitting: Advanced Practice Midwife

## 2017-10-31 ENCOUNTER — Ambulatory Visit (INDEPENDENT_AMBULATORY_CARE_PROVIDER_SITE_OTHER): Payer: Medicaid Other | Admitting: Obstetrics and Gynecology

## 2017-10-31 DIAGNOSIS — Z23 Encounter for immunization: Secondary | ICD-10-CM | POA: Diagnosis not present

## 2017-10-31 DIAGNOSIS — Z3A00 Weeks of gestation of pregnancy not specified: Secondary | ICD-10-CM | POA: Diagnosis not present

## 2017-10-31 DIAGNOSIS — Z3480 Encounter for supervision of other normal pregnancy, unspecified trimester: Secondary | ICD-10-CM | POA: Diagnosis not present

## 2017-10-31 DIAGNOSIS — Z3482 Encounter for supervision of other normal pregnancy, second trimester: Secondary | ICD-10-CM

## 2017-10-31 DIAGNOSIS — Z3481 Encounter for supervision of other normal pregnancy, first trimester: Secondary | ICD-10-CM

## 2017-10-31 DIAGNOSIS — F419 Anxiety disorder, unspecified: Secondary | ICD-10-CM | POA: Insufficient documentation

## 2017-10-31 LAB — POCT URINALYSIS DIP (DEVICE)
BILIRUBIN URINE: NEGATIVE
GLUCOSE, UA: NEGATIVE mg/dL
Ketones, ur: NEGATIVE mg/dL
NITRITE: NEGATIVE
Protein, ur: NEGATIVE mg/dL
Specific Gravity, Urine: >=1.03 (ref 1.005–1.030)
Urobilinogen, UA: 0.2 mg/dL (ref 0.0–1.0)
pH: 6.5 (ref 5.0–8.0)

## 2017-10-31 NOTE — Progress Notes (Signed)
Subjective:   Brittany Bowers is a 21 y.o. G2P1001 at 7440w0d by LMP being seen today for her first obstetrical visit.  Her obstetrical history is significant for smoker (Quit one month ago.) Patient does intend to breast feed. Pregnancy history fully reviewed.  Patient reports no complaints.  HISTORY: Obstetric History   G2   P1   T1   P0   A0   L1    SAB0   TAB0   Ectopic0   Multiple0   Live Births1     # Outcome Date GA Lbr Len/2nd Weight Sex Delivery Anes PTL Lv  2 Current           1 Term 05/29/15 6011w6d 23:54 / 00:18 5 lb 14.5 oz (2.679 kg) F Vag-Spont EPI  LIV     Apgar1:  6                Apgar5: 9     Past Medical History:  Diagnosis Date  . Asthma    uses inhaler twice/day   Past Surgical History:  Procedure Laterality Date  . NO PAST SURGERIES     Family History  Problem Relation Age of Onset  . Diabetes Father   . Diabetes Maternal Grandmother   . Stroke Maternal Grandmother        TIA  . Cancer Maternal Grandfather   . Cancer Maternal Aunt   . Stroke Maternal Aunt   . Alcohol abuse Neg Hx   . Arthritis Neg Hx   . Asthma Neg Hx   . Birth defects Neg Hx   . COPD Neg Hx   . Depression Neg Hx   . Drug abuse Neg Hx   . Early death Neg Hx   . Hearing loss Neg Hx   . Heart disease Neg Hx   . Hyperlipidemia Neg Hx   . Hypertension Neg Hx   . Kidney disease Neg Hx   . Learning disabilities Neg Hx   . Mental illness Neg Hx   . Mental retardation Neg Hx   . Miscarriages / Stillbirths Neg Hx   . Vision loss Neg Hx   . Varicose Veins Neg Hx    Social History   Tobacco Use  . Smoking status: Former Smoker    Packs/day: 0.50    Last attempt to quit: 09/17/2017    Years since quitting: 0.1  . Smokeless tobacco: Never Used  Substance Use Topics  . Alcohol use: No    Frequency: Never    Comment: occational  . Drug use: No    Comment: last smoked Sat night   Allergies  Allergen Reactions  . Bee Venom Swelling  . Onion Other (See Comments)   Upset stomach  . Pork-Derived Products Other (See Comments)    Religious preference   Current Outpatient Medications on File Prior to Visit  Medication Sig Dispense Refill  . Prenat w/o A Vit-FeFum-FePo-FA (CONCEPT OB) 130-92.4-1 MG CAPS Take 1 tablet by mouth daily. 30 capsule 12  . pyridOXINE (VITAMIN B-6) 50 MG tablet Take 1 tablet (50 mg total) by mouth 3 (three) times daily. 90 tablet 0  . diphenhydrAMINE (BENADRYL) 25 MG tablet Take 1 tablet (25 mg total) by mouth every 8 (eight) hours as needed. (Patient not taking: Reported on 10/17/2017) 20 tablet 0  . doxylamine, Sleep, (UNISOM) 25 MG tablet Take 1 tablet (25 mg total) by mouth at bedtime. For nausea and vomiting of pregnancy. You may also add 1/2  tablet during the day is persistent symptoms (Patient not taking: Reported on 10/31/2017) 90 tablet 0  . metroNIDAZOLE (FLAGYL) 500 MG tablet Take 1 tablet (500 mg total) by mouth 2 (two) times daily. (Patient not taking: Reported on 10/17/2017) 14 tablet 0  . promethazine (PHENERGAN) 25 MG tablet Take 1 tablet (25 mg total) by mouth every 6 (six) hours as needed for nausea or vomiting. (Patient not taking: Reported on 10/31/2017) 30 tablet 0   No current facility-administered medications on file prior to visit.      Exam   Vitals:   10/31/17 1300  BP: 120/73  Pulse: 82  Weight: 174 lb 8 oz (79.2 kg)   Fetal Heart Rate (bpm): 147  BP 120/73   Pulse 82   Wt 174 lb 8 oz (79.2 kg)   LMP 08/01/2017 (Approximate)   BMI 29.95 kg/m  Uterine Size: size equals dates  Pelvic Exam:    Perineum: No Hemorrhoids, Normal Perineum   Vulva: normal   Vagina:  normal mucosa, normal discharge, no palpable nodules   pH: Not done   Cervix: no bleeding following Pap, no cervical motion tenderness and no lesions   Adnexa: normal adnexa and no mass, fullness, tenderness   Bony Pelvis: Adequate  System: Breast:  No nipple retraction or dimpling, No nipple discharge or bleeding, No axillary or  supraclavicular adenopathy, Normal to palpation without dominant masses   Skin: normal coloration and turgor, no rashes    Neurologic: negative   Extremities: normal strength, tone, and muscle mass   HEENT neck supple with midline trachea and thyroid without masses   Mouth/Teeth mucous membranes moist, pharynx normal without lesions   Neck supple and no masses   Cardiovascular: regular rate and rhythm, no murmurs or gallops   Respiratory:  appears well, vitals normal, no respiratory distress, acyanotic, normal RR, neck free of mass or lymphadenopathy, chest clear, no wheezing, crepitations, rhonchi, normal symmetric air entry   Abdomen: soft, non-tender; bowel sounds normal; no masses,  no organomegaly   Urinary: urethral meatus normal     Assessment:   Pregnancy: G2P1001 Patient Active Problem List   Diagnosis Date Noted  . Supervision of other normal pregnancy, antepartum 10/31/2017  . Anxiety 10/31/2017  . NSVD (normal spontaneous vaginal delivery) 05/30/2015     Plan:  1. Supervision of other normal pregnancy, antepartum  - Babyscripts Schedule Optimization - Cytology - PAP - Hemoglobinopathy evaluation - Hemoglobin A1c - Culture, OB Urine - Obstetric Panel, Including HIV - Flu Vaccine QUAD 36+ mos IM - Genetic Screening: QUAD at next visit  - US MFM OB COMP + 14 WK; Future  2. Anxiety  Waited to see Asher MuirJamie and was unable to continue to wait. Will plan to see Asher MuirJamie at next visit.   Initial labs drawn. Continue prenatal vitamins. Genetic Screening discussed, Quad screen: requested. Ultrasound discussed; fetal anatomic survey: requested. Problem list reviewed and updated. The nature of  [facility] with multiple MDs and other Advanced Practice Providers was explained to patient; also emphasized that residents, students are part of our team. Routine obstetric precautions reviewed. Return in about 4 weeks (around 11/28/2017).     Shizuye Rupert, Harolyn RutherfordJennifer I, NP [facility] for [facility], [facility]

## 2017-10-31 NOTE — Patient Instructions (Signed)
See Brittany MuirJamie at next visit.

## 2017-11-01 LAB — CYTOLOGY - PAP
Chlamydia: NEGATIVE
Diagnosis: NEGATIVE
NEISSERIA GONORRHEA: NEGATIVE

## 2017-11-02 LAB — CULTURE, OB URINE

## 2017-11-02 LAB — URINE CULTURE, OB REFLEX

## 2017-11-02 LAB — HEMOGLOBIN A1C
ESTIMATED AVERAGE GLUCOSE: 117 mg/dL
Hgb A1c MFr Bld: 5.7 % — ABNORMAL HIGH (ref 4.8–5.6)

## 2017-11-02 LAB — HEMOGLOBINOPATHY EVALUATION
HEMOGLOBIN F QUANTITATION: 0 % (ref 0.0–2.0)
HGB A: 97.7 % (ref 96.4–98.8)
HGB C: 0 %
HGB S: 0 %
HGB VARIANT: 0 %
Hemoglobin A2 Quantitation: 2.3 % (ref 1.8–3.2)

## 2017-11-02 LAB — OBSTETRIC PANEL, INCLUDING HIV
ANTIBODY SCREEN: NEGATIVE
BASOS ABS: 0 10*3/uL (ref 0.0–0.2)
BASOS: 0 %
EOS (ABSOLUTE): 0.1 10*3/uL (ref 0.0–0.4)
Eos: 1 %
HEMATOCRIT: 36.2 % (ref 34.0–46.6)
HEP B S AG: NEGATIVE
HIV SCREEN 4TH GENERATION: NONREACTIVE
Hemoglobin: 12.1 g/dL (ref 11.1–15.9)
IMMATURE GRANS (ABS): 0 10*3/uL (ref 0.0–0.1)
Immature Granulocytes: 0 %
LYMPHS: 22 %
Lymphocytes Absolute: 2.3 10*3/uL (ref 0.7–3.1)
MCH: 29.9 pg (ref 26.6–33.0)
MCHC: 33.4 g/dL (ref 31.5–35.7)
MCV: 89 fL (ref 79–97)
MONOCYTES: 8 %
Monocytes Absolute: 0.9 10*3/uL (ref 0.1–0.9)
NEUTROS ABS: 7 10*3/uL (ref 1.4–7.0)
Neutrophils: 69 %
Platelets: 296 10*3/uL (ref 150–379)
RBC: 4.05 x10E6/uL (ref 3.77–5.28)
RDW: 14.5 % (ref 12.3–15.4)
RPR: NONREACTIVE
RUBELLA: 5.87 {index} (ref >0.99)
Rh Factor: POSITIVE
WBC: 10.3 10*3/uL (ref 3.4–10.8)

## 2017-11-15 NOTE — L&D Delivery Note (Signed)
Delivery Note Pt here for IOL due to IUGR. After cytotec x 2 doses, cervical foley, Pit and AROM she progressed to complete at 2335. She pushed well and at 11:45 PM a viable female was delivered via Vaginal, Spontaneous (Presentation: LOA).  APGAR:9,9 ; weight: pending.  Infant dried and placed on pt's abd; cord clamped and cut by family member; hospital cord blood sample collected. Placenta status: after 30 mins of CCT, Pit bolus and cytotec 800mcg buccal, manual removal of placenta was done successfully and appears to be intact; to pathology due to IUGR.  Cord: 3 vessel  Anesthesia:  Epidural Episiotomy:  None Lacerations:  None Est. Blood Loss (mL):  250cc  Mom to postpartum.  Baby to Couplet care / Skin to Skin.  Pt desires ppBTL; will keep NPO after eating one post del meal.  Santosh Petter CNM 05/06/2018, 12:30 AM  Please schedule this patient for Postpartum visit in: 1 week with the following provider: RN for BP check; then a 4 wk PP visit For C/S patients schedule nurse incision check in weeks 2 weeks: no High risk pregnancy complicated by: HTN- intrapartum; IUGR Delivery mode:  SVD Anticipated Birth Control:  BTL done PP PP Procedures needed: BP check  Schedule Integrated BH visit: no

## 2017-11-16 ENCOUNTER — Other Ambulatory Visit: Payer: Self-pay

## 2017-11-16 ENCOUNTER — Encounter (HOSPITAL_COMMUNITY): Payer: Self-pay | Admitting: *Deleted

## 2017-11-16 ENCOUNTER — Inpatient Hospital Stay (HOSPITAL_COMMUNITY)
Admission: AD | Admit: 2017-11-16 | Discharge: 2017-11-16 | Disposition: A | Discharge status: Home / Self Care | Payer: Medicaid Other | Source: Ambulatory Visit | Attending: Obstetrics & Gynecology | Admitting: Obstetrics & Gynecology

## 2017-11-16 DIAGNOSIS — F419 Anxiety disorder, unspecified: Secondary | ICD-10-CM | POA: Diagnosis not present

## 2017-11-16 DIAGNOSIS — O23591 Infection of other part of genital tract in pregnancy, first trimester: Secondary | ICD-10-CM | POA: Diagnosis not present

## 2017-11-16 DIAGNOSIS — R109 Unspecified abdominal pain: Secondary | ICD-10-CM | POA: Diagnosis present

## 2017-11-16 DIAGNOSIS — O99341 Other mental disorders complicating pregnancy, first trimester: Secondary | ICD-10-CM | POA: Diagnosis not present

## 2017-11-16 DIAGNOSIS — Z91030 Bee allergy status: Secondary | ICD-10-CM | POA: Insufficient documentation

## 2017-11-16 DIAGNOSIS — O99281 Endocrine, nutritional and metabolic diseases complicating pregnancy, first trimester: Secondary | ICD-10-CM | POA: Diagnosis not present

## 2017-11-16 DIAGNOSIS — O99511 Diseases of the respiratory system complicating pregnancy, first trimester: Secondary | ICD-10-CM | POA: Insufficient documentation

## 2017-11-16 DIAGNOSIS — Z87891 Personal history of nicotine dependence: Secondary | ICD-10-CM | POA: Insufficient documentation

## 2017-11-16 DIAGNOSIS — Z3A15 15 weeks gestation of pregnancy: Secondary | ICD-10-CM | POA: Diagnosis not present

## 2017-11-16 DIAGNOSIS — J45909 Unspecified asthma, uncomplicated: Secondary | ICD-10-CM | POA: Insufficient documentation

## 2017-11-16 DIAGNOSIS — E860 Dehydration: Secondary | ICD-10-CM

## 2017-11-16 DIAGNOSIS — B9689 Other specified bacterial agents as the cause of diseases classified elsewhere: Secondary | ICD-10-CM | POA: Diagnosis not present

## 2017-11-16 DIAGNOSIS — N760 Acute vaginitis: Secondary | ICD-10-CM | POA: Diagnosis not present

## 2017-11-16 DIAGNOSIS — Z3480 Encounter for supervision of other normal pregnancy, unspecified trimester: Secondary | ICD-10-CM

## 2017-11-16 DIAGNOSIS — O219 Vomiting of pregnancy, unspecified: Secondary | ICD-10-CM

## 2017-11-16 DIAGNOSIS — O26852 Spotting complicating pregnancy, second trimester: Secondary | ICD-10-CM

## 2017-11-16 LAB — URINALYSIS, ROUTINE W REFLEX MICROSCOPIC
Bacteria, UA: NONE SEEN
Bilirubin Urine: NEGATIVE
Glucose, UA: NEGATIVE mg/dL
Hgb urine dipstick: NEGATIVE
Ketones, ur: 5 mg/dL — AB
LEUKOCYTES UA: NEGATIVE
Nitrite: NEGATIVE
PH: 7 (ref 5.0–8.0)
Protein, ur: 30 mg/dL — AB
SPECIFIC GRAVITY, URINE: 1.021 (ref 1.005–1.030)

## 2017-11-16 LAB — WET PREP, GENITAL
Sperm: NONE SEEN
Trich, Wet Prep: NONE SEEN
Yeast Wet Prep HPF POC: NONE SEEN

## 2017-11-16 MED ORDER — PROMETHAZINE HCL 25 MG/ML IJ SOLN
25.0000 mg | Freq: Once | INTRAMUSCULAR | Status: AC
  Administered 2017-11-16: 25 mg via INTRAVENOUS
  Filled 2017-11-16: qty 1

## 2017-11-16 MED ORDER — METRONIDAZOLE 0.75 % VA GEL: 1.0000 | Freq: Every day | VAGINAL | 1 refills | Status: DC

## 2017-11-16 MED ORDER — DEXTROSE 5 % IN LACTATED RINGERS IV BOLUS
1000.0000 mL | Freq: Once | INTRAVENOUS | Status: AC
  Administered 2017-11-16: 1000 mL via INTRAVENOUS

## 2017-11-16 MED ORDER — PROMETHAZINE HCL 25 MG PO TABS: 12.5000 mg | ORAL_TABLET | Freq: Four times a day (QID) | ORAL | 2 refills | Status: DC | PRN

## 2017-11-16 MED ORDER — DOXYLAMINE-PYRIDOXINE 10-10 MG PO TBEC: DELAYED_RELEASE_TABLET | ORAL | 5 refills | Status: DC

## 2017-11-16 NOTE — MAU Note (Signed)
Urine is in the Lab 

## 2017-11-16 NOTE — MAU Note (Signed)
Pt presents with c/o lower abdominal pain that began Tuesday.  Reports spotting with wiping that began today.  Pt also reports H/A, hasn't taken meds.  Reports N&V since last night.  States no Rx for anit emetic

## 2017-11-16 NOTE — MAU Note (Signed)
Is just really hurting in bottom of stomach. Started last night.  Thought it was from nausea and from vomiting.  Woke up with it this morning.  Was dizzy at work last night, BP had dropped- was normal when EMS arrived. has been spotting since this morning.

## 2017-11-16 NOTE — MAU Provider Note (Signed)
Chief Complaint: Abdominal Pain   First Provider Initiated Contact with Patient 11/16/17 1708      SUBJECTIVE HPI: Brittany Bowers is a 22 y.o. G2P1001 at [redacted]w[redacted]d by LMP who presents to maternity admissions reporting abdominal pain, vaginal spotting, n/v and dizziness.  She reports n/v x 3-4 weeks and she had an Rx promised from her office visit on 12/17 but no medications were at her [employer]. She has not called the office to ask for her medications.  She reports she cannot keep down food or fluids with the n/v. She became dizzy at work last night, her BP was low, and her office called 911. When EMS arrived her BP was normal.  The abdominal pain and spotting started yesterday. Her pain is low in the front of her abdomen, improved after urinating, worse when standing.  She sees light red spotting when wiping only.  She has not tried any treatments. There are no other associated symptoms. She had normal vaginal cultures/STD testing on 12/17 and denies any STD risks. She denies vaginal itching/burning, urinary symptoms, h/a, dizziness, n/v, or fever/chills.     HPI  Past Medical History:  Diagnosis Date  . Asthma    uses inhaler twice/day   Past Surgical History:  Procedure Laterality Date  . NO PAST SURGERIES    . TOE SURGERY Bilateral    Social History   Socioeconomic History  . Marital status: Single    Spouse name: Not on file  . Number of children: Not on file  . Years of education: Not on file  . Highest education level: Not on file  Social Needs  . Financial resource strain: Not on file  . Food insecurity - worry: Not on file  . Food insecurity - inability: Not on file  . Transportation needs - medical: Not on file  . Transportation needs - non-medical: Not on file  Occupational History  . Not on file  Tobacco Use  . Smoking status: Former Smoker    Packs/day: 0.50    Last attempt to quit: 09/17/2017    Years since quitting: 0.1  . Smokeless tobacco: Never Used   Substance and Sexual Activity  . Alcohol use: No    Frequency: Never    Comment: occational  . Drug use: No    Comment: unsure last use "a while"  . Sexual activity: Yes    Birth control/protection: None  Other Topics Concern  . Not on file  Social History Narrative  . Not on file   No current facility-administered medications on file prior to encounter.    Current Outpatient Medications on File Prior to Encounter  Medication Sig Dispense Refill  . diphenhydrAMINE (BENADRYL) 25 MG tablet Take 1 tablet (25 mg total) by mouth every 8 (eight) hours as needed. (Patient not taking: Reported on 10/17/2017) 20 tablet 0  . Prenat w/o A Vit-FeFum-FePo-FA (CONCEPT OB) 130-92.4-1 MG CAPS Take 1 tablet by mouth daily. 30 capsule 12   Allergies  Allergen Reactions  . Bee Venom Swelling  . Onion Other (See Comments)    Upset stomach  . Pork-Derived Products Other (See Comments)    Religious preference    ROS:  Review of Systems  Constitutional: Negative for chills, fatigue and fever.  Eyes: Negative for visual disturbance.  Respiratory: Negative for shortness of breath.   Cardiovascular: Negative for chest pain.  Gastrointestinal: Positive for abdominal pain, nausea and vomiting.  Genitourinary: Positive for pelvic pain and vaginal bleeding. Negative for  difficulty urinating, dysuria, flank pain, vaginal discharge and vaginal pain.  Neurological: Negative for dizziness and headaches.  Psychiatric/Behavioral: Negative.      I have reviewed patient's Past Medical Hx, Surgical Hx, Family Hx, Social Hx, medications and allergies.   Physical Exam   Patient Vitals for the past 24 hrs:  BP Temp Temp src Pulse Resp SpO2 Weight  11/16/17 1911 (!) 99/59 98.3 F (36.8 C) Oral 71 18 - -  11/16/17 1618 108/67 97.9 F (36.6 C) Oral 84 16 - -  11/16/17 1606 126/73 98.5 F (36.9 C) Oral 81 18 99 % 169 lb (76.7 kg)   Constitutional: Well-developed, well-nourished female in no acute distress.   Cardiovascular: normal rate Respiratory: normal effort GI: Abd soft, non-tender. Pos BS x 4 MS: Extremities nontender, no edema, normal ROM Neurologic: Alert and oriented x 4.  GU: Neg CVAT.  PELVIC EXAM: Cervix pink, visually closed, without lesion, scant white creamy discharge, vaginal walls and external genitalia normal  Cervix 0/thick/posterior, firm  FHT 150 by doppler  LAB RESULTS Results for orders placed or performed during the hospital encounter of 11/16/17 (from the past 24 hour(s))  Urinalysis, Routine w reflex microscopic     Status: Abnormal   Collection Time: 11/16/17  4:05 PM  Result Value Ref Range   Color, Urine YELLOW YELLOW   APPearance HAZY (A) CLEAR   Specific Gravity, Urine 1.021 1.005 - 1.030   pH 7.0 5.0 - 8.0   Glucose, UA NEGATIVE NEGATIVE mg/dL   Hgb urine dipstick NEGATIVE NEGATIVE   Bilirubin Urine NEGATIVE NEGATIVE   Ketones, ur 5 (A) NEGATIVE mg/dL   Protein, ur 30 (A) NEGATIVE mg/dL   Nitrite NEGATIVE NEGATIVE   Leukocytes, UA NEGATIVE NEGATIVE   RBC / HPF 0-5 0 - 5 RBC/hpf   WBC, UA 0-5 0 - 5 WBC/hpf   Bacteria, UA NONE SEEN NONE SEEN   Squamous Epithelial / LPF 0-5 (A) NONE SEEN   Mucus PRESENT    Amorphous Crystal PRESENT   Wet prep, genital     Status: Abnormal   Collection Time: 11/16/17  5:23 PM  Result Value Ref Range   Yeast Wet Prep HPF POC NONE SEEN NONE SEEN   Trich, Wet Prep NONE SEEN NONE SEEN   Clue Cells Wet Prep HPF POC PRESENT (A) NONE SEEN   WBC, Wet Prep HPF POC MANY (A) NONE SEEN   Sperm NONE SEEN     O/Positive/-- (12/17 1340)  IMAGING No results found.  MAU Management/MDM: Ordered labs and reviewed results.  Treat mild dehydration with IV fluids, nausea with IV Phenergan.  Pt reported improved symptoms.  No evidence of bleeding on clinical exam, will treat for BV as possible cause of spotting.  Pt to f/u in office as scheduled.  Rx for Diclegis, Phenergan, and Metrogel. Pt discharged with strict second  trimester precautions.  ASSESSMENT 1. Mild dehydration   2. Supervision of other normal pregnancy, antepartum   3. Anxiety   4. Nausea and vomiting during pregnancy prior to [redacted] weeks gestation   5. Spotting affecting pregnancy in second trimester   6. BV (bacterial vaginosis)     PLAN Discharge home Allergies as of 11/16/2017      Reactions   Bee Venom Swelling   Onion Other (See Comments)   Upset stomach   Pork-derived Products Other (See Comments)   Religious preference      Medication List    STOP taking these medications   doxylamine (Sleep)  25 MG tablet Commonly known as:  UNISOM   metroNIDAZOLE 500 MG tablet Commonly known as:  FLAGYL   pyridOXINE 50 MG tablet Commonly known as:  VITAMIN B-6     TAKE these medications   CONCEPT OB 130-92.4-1 MG Caps Take 1 tablet by mouth daily.   diphenhydrAMINE 25 MG tablet Commonly known as:  BENADRYL Take 1 tablet (25 mg total) by mouth every 8 (eight) hours as needed.   Doxylamine-Pyridoxine 10-10 MG Tbec Commonly known as:  DICLEGIS Take 2 tabs at bedtime. If needed, add another tab in the morning. If needed, add another tab in the afternoon, up to 4 tabs/day.   metroNIDAZOLE 0.75 % vaginal gel Commonly known as:  METROGEL Place 1 Applicatorful vaginally at bedtime. Apply one applicatorful to vagina at bedtime for 5 days   promethazine 25 MG tablet Commonly known as:  PHENERGAN Take 0.5-1 tablets (12.5-25 mg total) by mouth every 6 (six) hours as needed for nausea or vomiting. What changed:  how much to take      Follow-up Information    [facility]   Specialty:  Obstetrics and Gynecology Why:  As scheduled, return to MAU as needed for emergencies Contact information: [address] [place] [id number] [phone]          Sharen CounterLisa Leftwich-Kirby Certified Nurse-Midwife 11/16/2017  8:21 PM

## 2017-11-21 ENCOUNTER — Encounter: Payer: Self-pay | Admitting: *Deleted

## 2017-11-28 ENCOUNTER — Institutional Professional Consult (permissible substitution): Payer: Medicaid Other

## 2017-11-28 ENCOUNTER — Encounter: Payer: Medicaid Other | Admitting: Advanced Practice Midwife

## 2017-11-28 NOTE — BH Specialist Note (Deleted)
Integrated Behavioral Health Initial Visit  MRN: [id number] Name: Brittany HazySheneka M Boger  Number of Integrated Behavioral Health Clinician visits:: {IBH Number of Visits:21014052} Session Start time: ***  Session End time: *** Total time: {IBH Total Time:21014050}  Type of Service: Integrated Behavioral Health- Individual/Family Interpretor:{yes GN:[id number]}no:[id number]} Interpretor Name and Language: ***   Warm Hand Off Completed.       SUBJECTIVE: Brittany Bowers is a 22 y.o. female accompanied by {CHL AMB ACCOMPANIED QM:[id number]}BY:[id number]} Patient was referred by *** for anxiety. Patient reports the following symptoms/concerns: *** Duration of problem: ***; Severity of problem: {Mild/Moderate/Severe:20260}  OBJECTIVE: Mood: {BHH MOOD:22306} and Affect: {BHH AFFECT:22307} Risk of harm to self or others: {CHL AMB BH Suicide Current Mental Status:21022748}  LIFE CONTEXT: Family and Social: *** School/Work: *** Self-Care: *** Life Changes: ***  GOALS ADDRESSED: Patient will: 1. Reduce symptoms of: {IBH Symptoms:21014056} 2. Increase knowledge and/or ability of: {IBH Patient Tools:21014057}  3. Demonstrate ability to: {IBH Goals:21014053}  INTERVENTIONS: Interventions utilized: {IBH Interventions:21014054}  Standardized Assessments completed: {IBH Screening Tools:21014051}  ASSESSMENT: Patient currently experiencing ***.   Patient may benefit from ***.  PLAN: 1. Follow up with behavioral health clinician on : *** 2. Behavioral recommendations: *** 3. Referral(s): {IBH Referrals:21014055} 4. "From scale of 1-10, how likely are you to follow plan?": ***  Rae LipsJamie C Duell Holdren, LCSW   Depression screen [facility] 2/9 10/31/2017  Decreased Interest 1  Down, Depressed, Hopeless 0  PHQ - 2 Score 1  Altered sleeping 1  Tired, decreased energy 1  Change in appetite 2  Feeling bad or failure about yourself  0  Trouble concentrating 1  Moving slowly or fidgety/restless 0  Suicidal thoughts 0  PHQ-9  Score 6   GAD 7 : Generalized Anxiety Score 10/31/2017  Nervous, Anxious, on Edge 2  Control/stop worrying 1  Worry too much - different things 1  Trouble relaxing 2  Restless 2  Easily annoyed or irritable 2  Afraid - awful might happen 1  Total GAD 7 Score 11

## 2017-12-05 ENCOUNTER — Encounter (HOSPITAL_COMMUNITY): Payer: Self-pay | Admitting: Obstetrics and Gynecology

## 2017-12-12 ENCOUNTER — Other Ambulatory Visit: Payer: Self-pay | Admitting: Obstetrics and Gynecology

## 2017-12-12 ENCOUNTER — Ambulatory Visit (HOSPITAL_COMMUNITY)
Admission: RE | Admit: 2017-12-12 | Discharge: 2017-12-12 | Disposition: A | Discharge status: Home / Self Care | Payer: Medicaid Other | Source: Ambulatory Visit | Attending: Obstetrics and Gynecology | Admitting: Obstetrics and Gynecology

## 2017-12-12 DIAGNOSIS — Z3482 Encounter for supervision of other normal pregnancy, second trimester: Secondary | ICD-10-CM | POA: Diagnosis not present

## 2017-12-12 DIAGNOSIS — Z3A18 18 weeks gestation of pregnancy: Secondary | ICD-10-CM | POA: Insufficient documentation

## 2017-12-12 DIAGNOSIS — Z3689 Encounter for other specified antenatal screening: Secondary | ICD-10-CM

## 2017-12-12 DIAGNOSIS — Z3480 Encounter for supervision of other normal pregnancy, unspecified trimester: Secondary | ICD-10-CM

## 2017-12-13 ENCOUNTER — Encounter: Payer: Medicaid Other | Admitting: Advanced Practice Midwife

## 2017-12-15 ENCOUNTER — Encounter: Payer: Self-pay | Admitting: *Deleted

## 2018-01-02 ENCOUNTER — Inpatient Hospital Stay (HOSPITAL_COMMUNITY)
Admission: AD | Admit: 2018-01-02 | Discharge: 2018-01-02 | Disposition: A | Discharge status: Home / Self Care | Payer: Medicaid Other | Source: Ambulatory Visit | Attending: Obstetrics and Gynecology | Admitting: Obstetrics and Gynecology

## 2018-01-02 ENCOUNTER — Encounter (HOSPITAL_COMMUNITY): Payer: Self-pay | Admitting: *Deleted

## 2018-01-02 DIAGNOSIS — F419 Anxiety disorder, unspecified: Secondary | ICD-10-CM

## 2018-01-02 DIAGNOSIS — Z91018 Allergy to other foods: Secondary | ICD-10-CM | POA: Insufficient documentation

## 2018-01-02 DIAGNOSIS — Z3A21 21 weeks gestation of pregnancy: Secondary | ICD-10-CM | POA: Diagnosis not present

## 2018-01-02 DIAGNOSIS — J45909 Unspecified asthma, uncomplicated: Secondary | ICD-10-CM | POA: Diagnosis not present

## 2018-01-02 DIAGNOSIS — R102 Pelvic and perineal pain: Secondary | ICD-10-CM | POA: Diagnosis not present

## 2018-01-02 DIAGNOSIS — Z833 Family history of diabetes mellitus: Secondary | ICD-10-CM | POA: Insufficient documentation

## 2018-01-02 DIAGNOSIS — R12 Heartburn: Secondary | ICD-10-CM | POA: Diagnosis not present

## 2018-01-02 DIAGNOSIS — Z79899 Other long term (current) drug therapy: Secondary | ICD-10-CM | POA: Diagnosis not present

## 2018-01-02 DIAGNOSIS — O26892 Other specified pregnancy related conditions, second trimester: Secondary | ICD-10-CM

## 2018-01-02 DIAGNOSIS — O99512 Diseases of the respiratory system complicating pregnancy, second trimester: Secondary | ICD-10-CM | POA: Diagnosis not present

## 2018-01-02 DIAGNOSIS — N949 Unspecified condition associated with female genital organs and menstrual cycle: Secondary | ICD-10-CM

## 2018-01-02 DIAGNOSIS — Z3480 Encounter for supervision of other normal pregnancy, unspecified trimester: Secondary | ICD-10-CM

## 2018-01-02 DIAGNOSIS — R109 Unspecified abdominal pain: Secondary | ICD-10-CM | POA: Diagnosis present

## 2018-01-02 DIAGNOSIS — Z87891 Personal history of nicotine dependence: Secondary | ICD-10-CM | POA: Diagnosis not present

## 2018-01-02 DIAGNOSIS — Z91030 Bee allergy status: Secondary | ICD-10-CM | POA: Diagnosis not present

## 2018-01-02 DIAGNOSIS — O99342 Other mental disorders complicating pregnancy, second trimester: Secondary | ICD-10-CM | POA: Insufficient documentation

## 2018-01-02 LAB — URINALYSIS, ROUTINE W REFLEX MICROSCOPIC
Bacteria, UA: NONE SEEN
Bilirubin Urine: NEGATIVE
Glucose, UA: 50 mg/dL — AB
Hgb urine dipstick: NEGATIVE
Ketones, ur: NEGATIVE mg/dL
Nitrite: NEGATIVE
Protein, ur: NEGATIVE mg/dL
SPECIFIC GRAVITY, URINE: 1.024 (ref 1.005–1.030)
pH: 6 (ref 5.0–8.0)

## 2018-01-02 MED ORDER — GI COCKTAIL ~~LOC~~
30.0000 mL | Freq: Once | ORAL | Status: AC
  Administered 2018-01-02: 30 mL via ORAL
  Filled 2018-01-02: qty 30

## 2018-01-02 MED ORDER — RANITIDINE HCL 150 MG PO TABS: 150.0000 mg | ORAL_TABLET | Freq: Two times a day (BID) | ORAL | 0 refills | Status: DC

## 2018-01-02 NOTE — MAU Note (Signed)
PT  SAYS SHE HAS PRESSURE  LOWER ABD - STARTED YESTERDAY .   ACID REFLUX STARTED 2-3 WEEKS AGO-   MAKES HER VOMIT .   TAKES  PHENERGAN-  FOR NAUSEA- TOOK YESTERDAY.  [facility]  .

## 2018-01-02 NOTE — MAU Provider Note (Signed)
History     CSN: [id number]  Arrival date and time: 01/02/18 2204   First Provider Initiated Contact with Patient 01/02/18 2250      Chief Complaint  Patient presents with  . Abdominal Pain   HPI Patient Brittany Bowers is a 22 y.o. G2P1001 at 6777w4d here with complaints of heart burn and lower abdominal pressure. She threw up 5 times yesterday and thinks that the abdominal pain is due to throwing up. She denies bleeding, LOF, contractions, dysuria, abnormal discharge or other ob-gyn complaint.  She has had heartburn in the past. She says that she did not know she could take anything for it.  This is an on-going problem.  She describes the pain as burning; it is located in her sternum. It is associated with lying down, not eating, vomiting. It is a 8/10. She is not taking anything for it. Nothing makes it better.   OB History    Gravida Para Term Preterm AB Living   2 1 1    0 1   SAB TAB Ectopic Multiple Live Births   0     0 1      Past Medical History:  Diagnosis Date  . Asthma    uses inhaler twice/day    Past Surgical History:  Procedure Laterality Date  . NO PAST SURGERIES    . TOE SURGERY Bilateral     Family History  Problem Relation Age of Onset  . Diabetes Father   . Diabetes Maternal Grandmother   . Stroke Maternal Grandmother        TIA  . Cancer Maternal Grandfather   . Cancer Maternal Aunt   . Stroke Maternal Aunt   . Alcohol abuse Neg Hx   . Arthritis Neg Hx   . Asthma Neg Hx   . Birth defects Neg Hx   . COPD Neg Hx   . Depression Neg Hx   . Drug abuse Neg Hx   . Early death Neg Hx   . Hearing loss Neg Hx   . Heart disease Neg Hx   . Hyperlipidemia Neg Hx   . Hypertension Neg Hx   . Kidney disease Neg Hx   . Learning disabilities Neg Hx   . Mental illness Neg Hx   . Mental retardation Neg Hx   . Miscarriages / Stillbirths Neg Hx   . Vision loss Neg Hx   . Varicose Veins Neg Hx     Social History   Tobacco Use  . Smoking status:  Former Smoker    Packs/day: 0.50    Last attempt to quit: 09/17/2017    Years since quitting: 0.2  . Smokeless tobacco: Never Used  Substance Use Topics  . Alcohol use: No    Frequency: Never    Comment: occational  . Drug use: No    Comment: last use 3 feb 19    Allergies:  Allergies  Allergen Reactions  . Bee Venom Swelling  . Onion Other (See Comments)    Upset stomach  . Pork-Derived Products Other (See Comments)    Religious preference    Medications Prior to Admission  Medication Sig Dispense Refill Last Dose  . Doxylamine-Pyridoxine (DICLEGIS) 10-10 MG TBEC Take 2 tabs at bedtime. If needed, add another tab in the morning. If needed, add another tab in the afternoon, up to 4 tabs/day. 100 tablet 5 Past Month at Unknown time  . Prenat w/o A Vit-FeFum-FePo-FA (CONCEPT OB) 130-92.4-1 MG CAPS Take 1 tablet by  mouth daily. 30 capsule 12 01/01/2018 at Unknown time  . promethazine (PHENERGAN) 25 MG tablet Take 0.5-1 tablets (12.5-25 mg total) by mouth every 6 (six) hours as needed for nausea or vomiting. 30 tablet 2 01/01/2018 at Unknown time  . diphenhydrAMINE (BENADRYL) 25 MG tablet Take 1 tablet (25 mg total) by mouth every 8 (eight) hours as needed. (Patient not taking: Reported on 10/17/2017) 20 tablet 0 More than a month at Unknown time  . metroNIDAZOLE (METROGEL) 0.75 % vaginal gel Place 1 Applicatorful vaginally at bedtime. Apply one applicatorful to vagina at bedtime for 5 days 70 g 1 More than a month at Unknown time    Review of Systems  Constitutional: Negative.   HENT: Negative.   Respiratory: Negative.   Cardiovascular: Negative.        Burning sensation in her chest  Gastrointestinal: Positive for abdominal pain.  Genitourinary: Negative.   Musculoskeletal: Negative.   Neurological: Negative.   Hematological: Negative.   Psychiatric/Behavioral: Negative.    Physical Exam   Blood pressure 109/71, pulse (!) 114, temperature 98.3 F (36.8 C), temperature source  Oral, resp. rate 18, height 5\' 2"  (1.575 m), weight 183 lb 4 oz (83.1 kg), last menstrual period 08/01/2017, unknown if currently breastfeeding.  Physical Exam  Constitutional: She is oriented to person, place, and time. She appears well-developed and well-nourished.  HENT:  Head: Normocephalic.  Eyes: Pupils are equal, round, and reactive to light.  Neck: Normal range of motion.  GI: Soft. She exhibits no distension and no mass. There is no tenderness. There is no rebound and no guarding.  Genitourinary: Vagina normal.  Musculoskeletal: Normal range of motion.  Neurological: She is alert and oriented to person, place, and time.  Skin: Skin is warm and dry.  Psychiatric: She has a normal mood and affect.    MAU Course  Procedures  MDM -FHR 145 by doppler -GI cocktail; patient's pain is completely gone.  -Patient was observed resting, smiling and talking in MAU. Walking without difficulty.   Assessment and Plan   1. Heartburn during pregnancy in second trimester   2. Supervision of other normal pregnancy, antepartum   3. Anxiety   4. Round ligament pain     2. Patient stable for discharge.  3. Reviewed warning signs (vag bleeding, leaking of fluid, contractions)  4. Reviewed heartburn prevention and safe meds in pregnancy; RX for Zantac given plus instructions on positioning and comfort measures for both heartburn and round ligament pain.  5. Patient verbalized understanding.   Charlesetta Garibaldi Kooistra 01/02/2018, 11:19 PM

## 2018-01-02 NOTE — Discharge Instructions (Signed)
Heartburn During Pregnancy Heartburn is pain or discomfort in the throat or chest. It may cause a burning feeling. It happens when stomach acid moves up into the tube that carries food from your mouth to your stomach (esophagus). Heartburn is common during pregnancy. It usually goes away or gets better after giving birth. Follow these instructions at home: Eating and drinking  Do not drink alcohol while you are pregnant.  Figure out which foods and beverages make you feel worse, and avoid them.  Beverages that you may want to avoid include: ? Coffee and tea (with or without caffeine). ? Energy drinks and sports drinks. ? Bubbly (carbonated) drinks or sodas. ? Citrus fruit juices.  Foods that you may want to avoid include: ? Chocolate and cocoa. ? Peppermint and mint flavorings. ? Garlic, onions, and horseradish. ? Spicy and acidic foods. These include peppers, chili powder, curry powder, vinegar, hot sauces, and barbecue sauce. ? Citrus fruits, such as oranges, lemons, and limes. ? Tomato-based foods, such as red sauce, chili, and salsa. ? Fried and fatty foods, such as donuts, french fries, potato chips, and high-fat dressings. ? High-fat meats, such as hot dogs, cold cuts, sausage, ham, and bacon. ? High-fat dairy items, such as whole milk, butter, and cheese.  Eat small meals often, instead of large meals.  Avoid drinking a lot of liquid with your meals.  Avoid eating meals during the 2-3 hours before you go to bed.  Avoid lying down right after you eat.  Do not exercise right after you eat. Medicines  Take over-the-counter and prescription medicines only as told by your doctor.  Do not take aspirin, ibuprofen, or other NSAIDs unless your doctor tells you to do that.  Your doctor may tell you to avoid medicines that have sodium bicarbonate in them. General instructions  If told, raise the head of your bed about 6 inches (15 cm). You can do this by putting blocks under  the legs. Sleeping with more pillows does not help with heartburn.  Do not use any products that contain nicotine or tobacco, such as cigarettes and e-cigarettes. If you need help quitting, ask your doctor.  Wear loose-fitting clothing.  Try to lower your stress, such as with yoga or meditation. If you need help, ask your doctor.  Stay at a healthy weight. If you are overweight, work with your doctor to safely lose weight.  Keep all follow-up visits as told by your doctor. This is important. Contact a doctor if:  You get new symptoms.  Your symptoms do not get better with treatment.  You have weight loss and you do not know why.  You have trouble swallowing.  You make loud sounds when you breathe (wheeze).  You have a cough that does not go away.  You have heartburn often for more than 2 weeks.  You feel sick to your stomach (nauseous), and this does not get better with treatment.  You are throwing up (vomiting), and this does not get better with treatment.  You have pain in your belly (abdomen). Get help right away if:  You have very bad chest pain that spreads to your arm, neck, or jaw.  You feel sweaty, dizzy, or light-headed.  You have trouble breathing.  You have pain when swallowing.  You throw up and your throw-up looks like blood or coffee grounds.  Your poop (stool) is bloody or black. This information is not intended to replace advice given to you by your health care provider.   Make sure you discuss any questions you have with your health care provider. Document Released: 12/04/2010 Document Revised: 07/19/2016 Document Reviewed: 07/19/2016 Elsevier Interactive Patient Education  2017 Elsevier Inc.  

## 2018-01-11 ENCOUNTER — Ambulatory Visit (INDEPENDENT_AMBULATORY_CARE_PROVIDER_SITE_OTHER): Payer: Medicaid Other | Admitting: Clinical

## 2018-01-11 ENCOUNTER — Ambulatory Visit (INDEPENDENT_AMBULATORY_CARE_PROVIDER_SITE_OTHER): Payer: Medicaid Other | Admitting: Student

## 2018-01-11 DIAGNOSIS — F4323 Adjustment disorder with mixed anxiety and depressed mood: Secondary | ICD-10-CM | POA: Diagnosis not present

## 2018-01-11 DIAGNOSIS — Z3480 Encounter for supervision of other normal pregnancy, unspecified trimester: Secondary | ICD-10-CM

## 2018-01-11 NOTE — Patient Instructions (Signed)

## 2018-01-11 NOTE — BH Specialist Note (Signed)
Integrated Behavioral Health Initial Visit  MRN: [id number] Name: Brittany Bowers  Number of Integrated Behavioral Health Clinician visits:: 1/6 Session Start time: 11:15  Session End time: 11:40 Total time: 30 minutes  Type of Service: Integrated Behavioral Health- Individual/Family Interpretor:No. Interpretor Name and Language: n/a   Warm Hand Off Completed.       SUBJECTIVE: Brittany Bowers is a 22 y.o. female accompanied by n/a Patient was referred by Judeth HornErin Lawrence, NP for anxiety. Patient reports the following symptoms/concerns: Pt states her primary concern today is feeling depressed, fatigue, and life stress; pt copes by talking to her best friend daily. Pt is open to talking about her feelings, along with  learning a self-coping strategy today. Pt has long-term goal of going back to school. Duration of problem: Current pregnancy; Severity of problem: moderate  OBJECTIVE: Mood: Appropriate and Affect: Appropriate Risk of harm to self or others: No plan to harm self or others  LIFE CONTEXT: Family and Social: Pt lives with 24yo and FOB; mother, sisters, extended family and friends supportive School/Work: Working fulltime Self-Care: Stopped smoking; no substances Life Changes: Current pregnancy  GOALS ADDRESSED: Patient will: 1. Reduce symptoms of: depression and stress 2. Increase knowledge and/or ability of: self-management skills and stress reduction  3. Demonstrate ability to: Increase healthy adjustment to current life circumstances  INTERVENTIONS: Interventions utilized: Mindfulness or Management consultantelaxation Training and Psychoeducation and/or Health Education  Standardized Assessments completed: GAD-7 and PHQ 9  ASSESSMENT: Patient currently experiencing Adjustment disorder with depressed and anxious mood   Patient may benefit from psychoeducation and brief therapeutic interventions regarding coping with symptoms of depression and anxiety. Marland Kitchen.  PLAN: 1. Follow up with  behavioral health clinician on : One month 2. Behavioral recommendations:  -CALM relaxation breathing exercise every morning( with prenatal vitamin) -Consider researching local options for obtaining RN degree -Read educational materials regarding coping with symptoms of depression and anxiety with panic attack  3. Referral(s): Integrated [facility] (In Clinic) and [facility]:  [facility] 4. "From scale of 1-10, how likely are you to follow plan?": 9  Rae LipsJamie C Letizia Hook, LCSW   [facility] 2[facility] 01/11/2018 10/31/2017  Decreased Interest 2 1  Down, Depressed, Hopeless 1 0  PHQ - 2 Score 3 1  Altered sleeping 2 1  Tired, decreased energy 2 1  Change in appetite 2 2  Feeling bad or failure about yourself  0 0  Trouble concentrating 1 1  Moving slowly or fidgety/restless 0 0  Suicidal thoughts 0 0  PHQ-9 Score 10 6   GAD 7 : Generalized Anxiety Score 01/11/2018 10/31/2017  Nervous, Anxious, on Edge 1 2  Control/stop worrying 0 1  Worry too much - different things 0 1  Trouble relaxing 1 2  Restless 0 2  Easily annoyed or irritable 1 2  Afraid - awful might happen 1 1  Total GAD 7 Score 4 11

## 2018-01-11 NOTE — Progress Notes (Signed)
   PRENATAL VISIT NOTE  Subjective:  Brittany Bowers is a 22 y.o. G2P1001 at 5739w6d being seen today for ongoing prenatal care.  She is currently monitored for the following issues for this low-risk pregnancy and has NSVD (normal spontaneous vaginal delivery); Supervision of other normal pregnancy, antepartum; and Anxiety on their problem list.  Patient reports no complaints.  Contractions: Not present. Vag. Bleeding: None.  Movement: Present. Denies leaking of fluid.   The following portions of the patient's history were reviewed and updated as appropriate: allergies, current medications, past family history, past medical history, past social history, past surgical history and problem list. Problem list updated.  Objective:   Vitals:   01/11/18 1013  BP: 108/66  Pulse: 91  Weight: 185 lb 1.6 oz (84 kg)    Fetal Status: Fetal Heart Rate (bpm): 144   Movement: Present     General:  Alert, oriented and cooperative. Patient is in no acute distress.  Skin: Skin is warm and dry. No rash noted.   Cardiovascular: Normal heart rate noted  Respiratory: Normal respiratory effort, no problems with respiration noted  Abdomen: Soft, gravid, appropriate for gestational age.  Pain/Pressure: Present     Pelvic: Cervical exam deferred        Extremities: Normal range of motion.  Edema: Trace  Mental Status:  Normal mood and affect. Normal behavior. Normal judgment and thought content.   Assessment and Plan:  Pregnancy: G2P1001 at 3339w6d  1. Supervision of other normal pregnancy, antepartum  - US MFM OB FOLLOW UP; Future - AFP TETRA  Preterm labor symptoms and general obstetric precautions including but not limited to vaginal bleeding, contractions, leaking of fluid and fetal movement were reviewed in detail with the patient. Please refer to After Visit Summary for other counseling recommendations.  Return in about 4 weeks (around 02/08/2018) for Routine OB.   Judeth HornErin Nancyjo Givhan, NP

## 2018-01-14 LAB — AFP TETRA
DIA VALUE (EIA): 393.69 pg/mL
DSR (By Age)    1 IN: 1074
Gestational Age: 22.9 WEEKS
MATERNAL AGE AT EDD: 23.9 a
MSAFP Mom: 2.19
MSAFP: 157.3 ng/mL
MSHCG: 3605 m[IU]/mL
Osb Risk: 1069
T18 (By Age): 1:4185 {titer}
WEIGHT: 185 [lb_av]
uE3 Value: 2.35 ng/mL

## 2018-01-18 ENCOUNTER — Ambulatory Visit (HOSPITAL_COMMUNITY): Payer: Medicaid Other | Attending: Student

## 2018-02-02 ENCOUNTER — Inpatient Hospital Stay (HOSPITAL_COMMUNITY)
Admission: AD | Admit: 2018-02-02 | Discharge: 2018-02-02 | Disposition: A | Discharge status: Home / Self Care | Payer: Medicaid Other | Source: Ambulatory Visit | Attending: Obstetrics and Gynecology | Admitting: Obstetrics and Gynecology

## 2018-02-02 ENCOUNTER — Encounter (HOSPITAL_COMMUNITY): Payer: Self-pay | Admitting: *Deleted

## 2018-02-02 DIAGNOSIS — Z91018 Allergy to other foods: Secondary | ICD-10-CM | POA: Insufficient documentation

## 2018-02-02 DIAGNOSIS — M791 Myalgia, unspecified site: Secondary | ICD-10-CM

## 2018-02-02 DIAGNOSIS — R05 Cough: Secondary | ICD-10-CM | POA: Insufficient documentation

## 2018-02-02 DIAGNOSIS — F419 Anxiety disorder, unspecified: Secondary | ICD-10-CM | POA: Insufficient documentation

## 2018-02-02 DIAGNOSIS — Z3A26 26 weeks gestation of pregnancy: Secondary | ICD-10-CM | POA: Insufficient documentation

## 2018-02-02 DIAGNOSIS — R6889 Other general symptoms and signs: Secondary | ICD-10-CM

## 2018-02-02 DIAGNOSIS — Z3480 Encounter for supervision of other normal pregnancy, unspecified trimester: Secondary | ICD-10-CM

## 2018-02-02 DIAGNOSIS — R52 Pain, unspecified: Secondary | ICD-10-CM | POA: Insufficient documentation

## 2018-02-02 DIAGNOSIS — Z3482 Encounter for supervision of other normal pregnancy, second trimester: Secondary | ICD-10-CM

## 2018-02-02 DIAGNOSIS — R0981 Nasal congestion: Secondary | ICD-10-CM | POA: Insufficient documentation

## 2018-02-02 DIAGNOSIS — Z91030 Bee allergy status: Secondary | ICD-10-CM | POA: Insufficient documentation

## 2018-02-02 DIAGNOSIS — R059 Cough, unspecified: Secondary | ICD-10-CM

## 2018-02-02 DIAGNOSIS — Z87891 Personal history of nicotine dependence: Secondary | ICD-10-CM | POA: Insufficient documentation

## 2018-02-02 DIAGNOSIS — O26892 Other specified pregnancy related conditions, second trimester: Secondary | ICD-10-CM | POA: Insufficient documentation

## 2018-02-02 DIAGNOSIS — O99342 Other mental disorders complicating pregnancy, second trimester: Secondary | ICD-10-CM | POA: Insufficient documentation

## 2018-02-02 DIAGNOSIS — J111 Influenza due to unidentified influenza virus with other respiratory manifestations: Secondary | ICD-10-CM

## 2018-02-02 LAB — URINALYSIS, ROUTINE W REFLEX MICROSCOPIC
Bilirubin Urine: NEGATIVE
Glucose, UA: 50 mg/dL — AB
Hgb urine dipstick: NEGATIVE
Ketones, ur: NEGATIVE mg/dL
Nitrite: NEGATIVE
Protein, ur: NEGATIVE mg/dL
Specific Gravity, Urine: 1.012 (ref 1.005–1.030)
pH: 7 (ref 5.0–8.0)

## 2018-02-02 LAB — INFLUENZA PANEL BY PCR (TYPE A & B)
INFLAPCR: NEGATIVE
Influenza B By PCR: NEGATIVE

## 2018-02-02 MED ORDER — OSELTAMIVIR PHOSPHATE 75 MG PO CAPS: 75.0000 mg | ORAL_CAPSULE | Freq: Two times a day (BID) | ORAL | 0 refills | Status: DC

## 2018-02-02 MED ORDER — GUAIFENESIN-CODEINE 100-10 MG/5ML PO SOLN: 5.0000 mL | ORAL | 0 refills | Status: DC | PRN

## 2018-02-02 MED ORDER — IBUPROFEN 600 MG PO TABS
600.0000 mg | ORAL_TABLET | Freq: Once | ORAL | Status: AC
  Administered 2018-02-02: 600 mg via ORAL
  Filled 2018-02-02: qty 1

## 2018-02-02 NOTE — MAU Provider Note (Signed)
History     CSN: [id number]  Arrival date and time: 02/02/18 1158   None     Chief Complaint  Patient presents with  . Cough  . Nasal Congestion  . Generalized Body Aches   HPI   [name] is a 22 y.o. female G2P1001 @ [redacted]w[redacted]d here in MAU with cough, congestion, runny nose, body aches. Symptoms started yesterday. No sick contacts. Says she took some B powder yesterday which did not help. Says she is unable to sleep because of her body aches, cough, and her back pain.  Says she did receive her flu shot this year. + fetal movement. No bleeding.   OB History    Gravida  2   Para  1   Term  1   Preterm      AB  0   Living  1     SAB  0   TAB      Ectopic      Multiple  0   Live Births  1           Past Medical History:  Diagnosis Date  . Asthma    uses inhaler twice/day    Past Surgical History:  Procedure Laterality Date  . NO PAST SURGERIES    . TOE SURGERY Bilateral     Family History  Problem Relation Age of Onset  . Diabetes Father   . Diabetes Maternal Grandmother   . Stroke Maternal Grandmother        TIA  . Cancer Maternal Grandfather   . Cancer Maternal Aunt   . Stroke Maternal Aunt   . Alcohol abuse Neg Hx   . Arthritis Neg Hx   . Asthma Neg Hx   . Birth defects Neg Hx   . COPD Neg Hx   . Depression Neg Hx   . Drug abuse Neg Hx   . Early death Neg Hx   . Hearing loss Neg Hx   . Heart disease Neg Hx   . Hyperlipidemia Neg Hx   . Hypertension Neg Hx   . Kidney disease Neg Hx   . Learning disabilities Neg Hx   . Mental illness Neg Hx   . Mental retardation Neg Hx   . Miscarriages / Stillbirths Neg Hx   . Vision loss Neg Hx   . Varicose Veins Neg Hx     Social History   Tobacco Use  . Smoking status: Former Smoker    Packs/day: 0.50    Last attempt to quit: 09/17/2017    Years since quitting: 0.3  . Smokeless tobacco: Never Used  Substance Use Topics  . Alcohol use: No    Frequency: Never    Comment:  occational  . Drug use: No    Types: Marijuana    Comment: last use 3 feb 19    Allergies:  Allergies  Allergen Reactions  . Bee Venom Swelling  . Onion Other (See Comments)    Upset stomach  . Pork-Derived Products Other (See Comments)    Religious preference    Medications Prior to Admission  Medication Sig Dispense Refill Last Dose  . diphenhydrAMINE (BENADRYL) 25 MG tablet Take 1 tablet (25 mg total) by mouth every 8 (eight) hours as needed. (Patient not taking: Reported on 10/17/2017) 20 tablet 0 More than a month at Unknown time  . Doxylamine-Pyridoxine (DICLEGIS) 10-10 MG TBEC Take 2 tabs at bedtime. If needed, add another tab in the morning. If needed, add another  tab in the afternoon, up to 4 tabs/day. 100 tablet 5 Past Month at Unknown time  . Prenat w/o A Vit-FeFum-FePo-FA (CONCEPT OB) 130-92.4-1 MG CAPS Take 1 tablet by mouth daily. 30 capsule 12 Taking  . promethazine (PHENERGAN) 25 MG tablet Take 0.5-1 tablets (12.5-25 mg total) by mouth every 6 (six) hours as needed for nausea or vomiting. 30 tablet 2 Taking  . ranitidine (ZANTAC) 150 MG tablet Take 1 tablet (150 mg total) by mouth 2 (two) times daily. 60 tablet 0 Taking   Results for orders placed or performed during the hospital encounter of 02/02/18 (from the past 48 hour(s))  Urinalysis, Routine w reflex microscopic     Status: Abnormal   Collection Time: 02/02/18 12:12 PM  Result Value Ref Range   Color, Urine YELLOW YELLOW   APPearance HAZY (A) CLEAR   Specific Gravity, Urine 1.012 1.005 - 1.030   pH 7.0 5.0 - 8.0   Glucose, UA 50 (A) NEGATIVE mg/dL   Hgb urine dipstick NEGATIVE NEGATIVE   Bilirubin Urine NEGATIVE NEGATIVE   Ketones, ur NEGATIVE NEGATIVE mg/dL   Protein, ur NEGATIVE NEGATIVE mg/dL   Nitrite NEGATIVE NEGATIVE   Leukocytes, UA TRACE (A) NEGATIVE   RBC / HPF 0-5 0 - 5 RBC/hpf   WBC, UA 0-5 0 - 5 WBC/hpf   Bacteria, UA RARE (A) NONE SEEN   Squamous Epithelial / LPF 0-5 (A) NONE SEEN   Mucus  PRESENT     Comment: Performed at [facility], [address], [place], [place] [address]  Influenza panel by PCR (type A & B)     Status: None   Collection Time: 02/02/18  1:30 PM  Result Value Ref Range   Influenza A By PCR NEGATIVE NEGATIVE   Influenza B By PCR NEGATIVE NEGATIVE    Comment: (NOTE) The Xpert Xpress Flu assay is intended as an aid in the diagnosis of  influenza and should not be used as a sole basis for treatment.  This  assay is FDA approved for nasopharyngeal swab specimens only. Nasal  washings and aspirates are unacceptable for Xpert Xpress Flu testing. Performed at [facility], [address], [place], [place] [address]    Review of Systems  Constitutional: Negative for fever.  HENT: Negative for sore throat.   Respiratory: Negative for shortness of breath.   Cardiovascular: Negative for chest pain.   Physical Exam   Blood pressure (!) 96/51, pulse 99, temperature 98.7 F (37.1 C), temperature source Oral, resp. rate 16, height 5' 2.5" (1.588 m), weight 186 lb (84.4 kg), last menstrual period 08/01/2017, SpO2 100 %, unknown if currently breastfeeding.  Physical Exam  Constitutional: She is oriented to person, place, and time. She appears well-developed and well-nourished. No distress.  HENT:  Head: Normocephalic.  Nose: Nose normal.  Eyes: Pupils are equal, round, and reactive to light.  Cardiovascular: Normal rate.  Respiratory: Effort normal and breath sounds normal. No respiratory distress. She has no wheezes. She has no rales.  GI: Soft. She exhibits no distension. There is no tenderness. There is no rebound and no guarding.  Musculoskeletal: Normal range of motion.  Neurological: She is alert and oriented to person, place, and time.  Skin: Skin is warm. She is not diaphoretic.  Psychiatric: Her behavior is normal.   Fetal Tracing: Baseline: 140 bpm Variability: Moderate  Accelerations: 10x10 Decelerations: variable  Toco: None  MAU  Course  Procedures  None  MDM  Ibuprofen given 600 mg PO Influenza swab  collected   Assessment and Plan   A:  1. Flu-like symptoms   2. Supervision of other normal pregnancy, antepartum   3. Anxiety   4. Generalized body aches   5. Cough     P:  Discharge home with strict return precautions Influenza swab negative, however given patient's symptoms will treat presumptively.  Rx: Tamiflu, Codeine cough Return to MAU if symptoms worsen  Increase oral fluid intake.    Duane Lopeasch, Jaques Mineer I, NP 02/02/2018 7:56 PM

## 2018-02-02 NOTE — Discharge Instructions (Signed)
Cool Mist Vaporizer A cool mist vaporizer is a device that releases a cool mist into the air. If you have a cough or a cold, using a vaporizer may help relieve your symptoms. The mist adds moisture to the air, which may help thin your mucus and make it less sticky. When your mucus is thin and less sticky, it easier for you to breathe and to cough up secretions. Do not use a vaporizer if you are allergic to mold. Follow these instructions at home:  Follow the instructions that come with the vaporizer.  Do not use anything other than distilled water in the vaporizer.  Do not run the vaporizer all of the time. Doing that can cause mold or bacteria to grow in the vaporizer.  Clean the vaporizer after each time that you use it.  Clean and dry the vaporizer well before storing it.  Stop using the vaporizer if your breathing symptoms get worse. This information is not intended to replace advice given to you by your health care provider. Make sure you discuss any questions you have with your health care provider. Document Released: 07/29/2004 Document Revised: 05/21/2016 Document Reviewed: 01/31/2016 Elsevier Interactive Patient Education  2018 Elsevier Inc.  

## 2018-02-02 NOTE — MAU Note (Signed)
Pt reports body aches, cough, congestion.

## 2018-02-08 ENCOUNTER — Ambulatory Visit: Payer: Medicaid Other

## 2018-02-08 ENCOUNTER — Encounter: Payer: Medicaid Other | Admitting: Medical

## 2018-03-13 ENCOUNTER — Ambulatory Visit: Payer: Medicaid Other | Admitting: Clinical

## 2018-03-13 ENCOUNTER — Ambulatory Visit (INDEPENDENT_AMBULATORY_CARE_PROVIDER_SITE_OTHER): Payer: Medicaid Other | Admitting: Advanced Practice Midwife

## 2018-03-13 ENCOUNTER — Encounter: Payer: Self-pay | Admitting: Advanced Practice Midwife

## 2018-03-13 VITALS — Wt 188.5 lb

## 2018-03-13 DIAGNOSIS — Z3480 Encounter for supervision of other normal pregnancy, unspecified trimester: Secondary | ICD-10-CM

## 2018-03-13 DIAGNOSIS — Z3483 Encounter for supervision of other normal pregnancy, third trimester: Secondary | ICD-10-CM

## 2018-03-13 DIAGNOSIS — F4323 Adjustment disorder with mixed anxiety and depressed mood: Secondary | ICD-10-CM

## 2018-03-13 NOTE — BH Specialist Note (Signed)
Integrated Behavioral Health Follow Up Visit  MRN: [id number] Name: BLONDINE HOTTEL  Number of Integrated Behavioral Health Clinician visits: 2/6 Session Start time: 3:45  Session End time: 3:46 Total time: 15 minutes  Type of Service: Integrated Behavioral Health- Individual/Family Interpretor:No. Interpretor Name and Language: n/a  SUBJECTIVE: Brittany Bowers is a 22 y.o. female accompanied by n/a Patient was referred by Judeth Horn, NP for anxiety. Patient reports the following symptoms/concerns: Pt states that she is feeling a little more tired than usual, attributed to working and third trimester of pregnancy, and says she is using self-coping strategies learned at her last visit with [facility] to help manage her symptoms. Duration of problem: Current pregnancy; Severity of problem: moderate  OBJECTIVE: Mood: Normal and Affect: Appropriate Risk of harm to self or others: No plan to harm self or others  LIFE CONTEXT: Family and Social: Pt lives with 2yo and FOB; mother, sisters, family and friends supportive School/Work: Working fulltime Self-Care: Stopped smoking; no substances Life Changes: Current pregnancy  GOALS ADDRESSED: Patient will: 1.  Maintain reduction of  symptoms of: depression and stress  2.  Demonstrate ability to: Increase healthy adjustment to current life circumstances  INTERVENTIONS: Interventions utilized:  Supportive Counseling Standardized Assessments completed: GAD-7 and PHQ 9  ASSESSMENT: Patient currently experiencing Adjustment disorder with depressed and anxious mood.   Patient may benefit from continued brief psychotherapeutic interventions regarding coping with symptoms of anxiety and depression  PLAN: 1. Follow up with behavioral health clinician on : As needed 2. Behavioral recommendations:  -Continue using relaxation breathing exercises daily, for as long as remains helpful to cope  -Consider researching options for obtaining RN degree,  prior to birth 3. Referral(s): Integrated [employer] (In Clinic) 4. "From scale of 1-10, how likely are you to follow plan?": 9  Rae Lips, LCSW  Depression screen [facility] 2/9 01/11/2018 10/31/2017  Decreased Interest 2 1  Down, Depressed, Hopeless 1 0  PHQ - 2 Score 3 1  Altered sleeping 2 1  Tired, decreased energy 2 1  Change in appetite 2 2  Feeling bad or failure about yourself  0 0  Trouble concentrating 1 1  Moving slowly or fidgety/restless 0 0  Suicidal thoughts 0 0  PHQ-9 Score 10 6   GAD 7 : Generalized Anxiety Score 01/11/2018 10/31/2017  Nervous, Anxious, on Edge 1 2  Control/stop worrying 0 1  Worry too much - different things 0 1  Trouble relaxing 1 2  Restless 0 2  Easily annoyed or irritable 1 2  Afraid - awful might happen 1 1  Total GAD 7 Score 4 11

## 2018-03-13 NOTE — Patient Instructions (Signed)

## 2018-03-13 NOTE — Progress Notes (Signed)
   PRENATAL VISIT NOTE  Subjective:  Brittany Bowers is a 22 y.o. G2P1001 at [redacted]w[redacted]d being seen today for ongoing prenatal care.  She is currently monitored for the following issues for this low-risk pregnancy and has NSVD (normal spontaneous vaginal delivery); Supervision of other normal pregnancy, antepartum; and Anxiety on their problem list.  Patient reports no complaints.  Contractions: Not present. Vag. Bleeding: None.  Movement: Present. Denies leaking of fluid.   The following portions of the patient's history were reviewed and updated as appropriate: allergies, current medications, past family history, past medical history, past social history, past surgical history and problem list. Problem list updated.  Objective:   Vitals:   03/13/18 1532  Weight: 188 lb 8 oz (85.5 kg)    Fetal Status: Fetal Heart Rate (bpm): 146 Fundal Height: 32 cm Movement: Present     General:  Alert, oriented and cooperative. Patient is in no acute distress.  Skin: Skin is warm and dry. No rash noted.   Cardiovascular: Normal heart rate noted  Respiratory: Normal respiratory effort, no problems with respiration noted  Abdomen: Soft, gravid, appropriate for gestational age.  Pain/Pressure: Present     Pelvic: Cervical exam deferred        Extremities: Normal range of motion.  Edema: Trace  Mental Status: Normal mood and affect. Normal behavior. Normal judgment and thought content.   Assessment and Plan:  Pregnancy: G2P1001 at [redacted]w[redacted]d  1. Supervision of other normal pregnancy, antepartum - CBC; Future - RPR; Future - HIV antibody (with reflex); Future - Glucose Tolerance, 2 Hours w/1 Hour; Future - Patient not fasting today, will return for 28 week labs ASAP  - Patient declines to see Asher Muir today   Preterm labor symptoms and general obstetric precautions including but not limited to vaginal bleeding, contractions, leaking of fluid and fetal movement were reviewed in detail with the patient. Please  refer to After Visit Summary for other counseling recommendations.  Return in about 2 weeks (around 03/27/2018).  No future appointments.  Thressa Sheller, CNM

## 2018-03-15 ENCOUNTER — Other Ambulatory Visit: Payer: Self-pay

## 2018-03-15 DIAGNOSIS — Z3480 Encounter for supervision of other normal pregnancy, unspecified trimester: Secondary | ICD-10-CM

## 2018-03-16 ENCOUNTER — Other Ambulatory Visit: Payer: Self-pay

## 2018-03-16 ENCOUNTER — Telehealth: Payer: Self-pay | Admitting: Family Medicine

## 2018-03-16 NOTE — Telephone Encounter (Signed)
Patient called to say she could not make her appointment today, and needed to reschedule. Wanted to come in the afternoon when she gets off work. I explained to her that she had to be fasting. She stated she knew that, and it was ok. Appointment made.

## 2018-03-17 ENCOUNTER — Encounter: Payer: Self-pay | Admitting: *Deleted

## 2018-03-20 ENCOUNTER — Other Ambulatory Visit: Payer: Self-pay

## 2018-03-20 ENCOUNTER — Ambulatory Visit: Payer: Medicaid Other | Admitting: Clinical

## 2018-03-20 DIAGNOSIS — Z3480 Encounter for supervision of other normal pregnancy, unspecified trimester: Secondary | ICD-10-CM

## 2018-03-20 DIAGNOSIS — F4323 Adjustment disorder with mixed anxiety and depressed mood: Secondary | ICD-10-CM

## 2018-03-20 NOTE — BH Specialist Note (Signed)
Integrated Behavioral Health Follow Up Visit  MRN: [id number] Name: Brittany Bowers  Number of Integrated Behavioral Health Clinician visits: 3/6 10 min only Session Start time: 3:35  Session End time: 3:45 Total time: 10 min  Type of Service: Integrated Behavioral Health- Individual/Family Interpretor:No. Interpretor Name and Language: n/a  SUBJECTIVE: Brittany Bowers is a 22 y.o. female accompanied by n/a Patient was referred by Judeth Horn, NP  for anxiety at previous visit. Patient reports the following symptoms/concerns: Pt states she is slightly stressed about reduced work hours;  is using relaxation breathing exercises and sleep app daily and feels she is coping well. Pt is looking forward to postpartum and her decision to breastfeed.  Duration of problem: Current pregnancy; Severity of problemr: mild  OBJECTIVE: Mood: normal and Affect: Appropriate Risk of harm to self or others: No plan to harm self or others  LIFE CONTEXT: Family and Social: Pt lives with 2yo and FOB; mother, sisters, family and friends all supportive School/Work: Work reduced to part-time hours Self-Care: Maintained stop smoking; using self-coping strategies daily Life Changes: Current pregnancy  GOALS ADDRESSED: Patient will: 1.  Maintain reduction of  symptoms of: depression and stress  INTERVENTIONS: Interventions utilized:  Supportive Counseling Standardized Assessments completed: Not Needed  ASSESSMENT: Patient currently experiencing Adjustment disorder with depressed and anxious mood  Patient may benefit from brief supportive counseling today.  PLAN: 1. Follow up with behavioral health clinician on : Postpartum visit, or earlier, as requested by pt 2. Behavioral recommendations:  -Continue using daily self-coping strategies(relaxation breathing and sleep app) -Take home and read Postpartum Planner today 3. Referral(s): Integrated Behavioral Health Services (In Clinic) 4. "From scale  of 1-10, how likely are you to follow plan?": 10  Rae Lips, LCSW  Depression screen [facility] 2/9 03/14/2018 01/11/2018 10/31/2017  Decreased Interest Down, Depressed, Hopeless 2 1 0  PHQ - 2 Score Altered sleeping Tired, decreased energy Change in appetite Feeling bad or failure about yourself  0 0 0  Trouble concentrating Moving slowly or fidgety/restless 0 0 0  Suicidal thoughts 0 0 0  PHQ-9 Score GAD 7 : Generalized Anxiety Score 03/14/2018 01/11/2018 10/31/2017  Nervous, Anxious, on Edge Control/stop worrying 1 0 1  Worry too much - different things 1 0 1  Trouble relaxing Restless 0 0 2  Easily annoyed or irritable Afraid - awful might happen Total GAD 7 Score 6 4 11

## 2018-03-20 NOTE — BH Specialist Note (Signed)
Error

## 2018-03-21 LAB — CBC
HEMATOCRIT: 31 % — AB (ref 34.0–46.6)
HEMOGLOBIN: 10.3 g/dL — AB (ref 11.1–15.9)
MCH: 28.4 pg (ref 26.6–33.0)
MCHC: 33.2 g/dL (ref 31.5–35.7)
MCV: 85 fL (ref 79–97)
Platelets: 272 10*3/uL (ref 150–379)
RBC: 3.63 x10E6/uL — ABNORMAL LOW (ref 3.77–5.28)
RDW: 13.7 % (ref 12.3–15.4)
WBC: 11.1 10*3/uL — AB (ref 3.4–10.8)

## 2018-03-21 LAB — RPR: RPR Ser Ql: NONREACTIVE

## 2018-03-21 LAB — GLUCOSE TOLERANCE, 1 HOUR: GLUCOSE, 1HR PP: 132 mg/dL (ref 65–199)

## 2018-03-21 LAB — HIV ANTIBODY (ROUTINE TESTING W REFLEX): HIV SCREEN 4TH GENERATION: NONREACTIVE

## 2018-03-23 ENCOUNTER — Encounter: Payer: Self-pay | Admitting: Family Medicine

## 2018-03-27 ENCOUNTER — Encounter: Payer: Self-pay | Admitting: Certified Nurse Midwife

## 2018-03-27 ENCOUNTER — Ambulatory Visit (INDEPENDENT_AMBULATORY_CARE_PROVIDER_SITE_OTHER): Payer: Medicaid Other | Admitting: Certified Nurse Midwife

## 2018-03-27 VITALS — BP 126/79 | HR 91 | Wt 190.2 lb

## 2018-03-27 DIAGNOSIS — Z3483 Encounter for supervision of other normal pregnancy, third trimester: Secondary | ICD-10-CM

## 2018-03-27 DIAGNOSIS — Z3480 Encounter for supervision of other normal pregnancy, unspecified trimester: Secondary | ICD-10-CM

## 2018-03-27 NOTE — Patient Instructions (Signed)

## 2018-03-27 NOTE — Progress Notes (Incomplete)
   PRENATAL VISIT NOTE  Subjective:  Brittany Bowers is a 22 y.o. G2P1001 at [redacted]w[redacted]d being seen today for ongoing prenatal care.  She is currently monitored for the following issues for this {Blank single:19197::"high-risk","low-risk"} pregnancy and has NSVD (normal spontaneous vaginal delivery); Supervision of other normal pregnancy, antepartum; and Anxiety on their problem list.  Patient reports {sx:14538}.  Contractions: Irritability. Vag. Bleeding: None.  Movement: Present. Denies leaking of fluid.   The following portions of the patient's history were reviewed and updated as appropriate: allergies, current medications, past family history, past medical history, past social history, past surgical history and problem list. Problem list updated.  Objective:   Vitals:   03/27/18 1641  BP: 126/79  Pulse: 91  Weight: 190 lb 3.2 oz (86.3 kg)    Fetal Status: Fetal Heart Rate (bpm): 146 Fundal Height: 32 cm Movement: Present     General:  Alert, oriented and cooperative. Patient is in no acute distress.  Skin: Skin is warm and dry. No rash noted.   Cardiovascular: Normal heart rate noted  Respiratory: Normal respiratory effort, no problems with respiration noted  Abdomen: Soft, gravid, appropriate for gestational age.  Pain/Pressure: Present     Pelvic: {Blank single:19197::"Cervical exam performed","Cervical exam deferred"}        Extremities: Normal range of motion.  Edema: Trace  Mental Status: Normal mood and affect. Normal behavior. Normal judgment and thought content.   Assessment and Plan:  Pregnancy: G2P1001 at [redacted]w[redacted]d  1. Supervision of other normal pregnancy, antepartum ***  {Blank single:19197::"Term","Preterm"} labor symptoms and general obstetric precautions including but not limited to vaginal bleeding, contractions, leaking of fluid and fetal movement were reviewed in detail with the patient. Please refer to After Visit Summary for other counseling recommendations.    Return in about 2 weeks (around 04/10/2018) for ROB.  Future Appointments  Date Time Provider Department Center  04/14/2018  4:15 PM Raelyn Mora, CNM [facility]  04/20/2018  3:15 PM Marvetta Gibbons Brand Males, NP [facility]  04/27/2018  4:15 PM Judeth Horn, NP [facility]  05/04/2018  4:15 PM Crisoforo Oxford, Charlesetta Garibaldi, CNM [facility]  05/11/2018  4:15 PM Crisoforo Oxford, Charlesetta Garibaldi, CNM [facility]    Sharyon Cable, CNM

## 2018-03-27 NOTE — Progress Notes (Signed)
   PRENATAL VISIT NOTE  Subjective:  Brittany Bowers is a 22 y.o. G2P1001 at [redacted]w[redacted]d being seen today for ongoing prenatal care.  She is currently monitored for the following issues for this low-risk pregnancy and has NSVD (normal spontaneous vaginal delivery); Supervision of other normal pregnancy, antepartum; and Anxiety on their problem list.  Patient reports reports losing mucus plug this morning, denies contractions- reports occasional Braxton Hicks contractions but none today .  Contractions: Irritability. Vag. Bleeding: None.  Movement: Present. Denies leaking of fluid.   The following portions of the patient's history were reviewed and updated as appropriate: allergies, current medications, past family history, past medical history, past social history, past surgical history and problem list. Problem list updated.  Objective:   Vitals:   03/27/18 1641  BP: 126/79  Pulse: 91  Weight: 190 lb 3.2 oz (86.3 kg)    Fetal Status: Fetal Heart Rate (bpm): 146 Fundal Height: 32 cm Movement: Present     General:  Alert, oriented and cooperative. Patient is in no acute distress.  Skin: Skin is warm and dry. No rash noted.   Cardiovascular: Normal heart rate noted  Respiratory: Normal respiratory effort, no problems with respiration noted  Abdomen: Soft, gravid, appropriate for gestational age.  Pain/Pressure: Present     Pelvic: Cervical exam deferred        Extremities: Normal range of motion.  Edema: Trace  Mental Status: Normal mood and affect. Normal behavior. Normal judgment and thought content.   Assessment and Plan:  Pregnancy: G2P1001 at [redacted]w[redacted]d  1. Supervision of other normal pregnancy, antepartum -Patient doing well, reports losing mucus plug  -Anticipatory guidance on upcoming appointments  -Educated and discussed reasons to come to [facility] to be evaluated   Preterm labor symptoms and general obstetric precautions including but not limited to vaginal bleeding, contractions,  leaking of fluid and fetal movement were reviewed in detail with the patient. Please refer to After Visit Summary for other counseling recommendations.  Return in about 2 weeks (around 04/10/2018) for ROB.  Future Appointments  Date Time Provider Department Center  04/14/2018  4:15 PM Raelyn Mora, CNM [facility]  04/20/2018  3:15 PM Marvetta Gibbons Brand Males, NP [facility]  04/27/2018  4:15 PM Judeth Horn, NP [facility]OC  05/04/2018  4:15 PM Crisoforo Oxford, Charlesetta Garibaldi, CNM [facility]  05/11/2018  4:15 PM Crisoforo Oxford, Charlesetta Garibaldi, CNM [facility]    Sharyon Cable, CNM

## 2018-04-14 ENCOUNTER — Other Ambulatory Visit (HOSPITAL_COMMUNITY)
Admission: RE | Admit: 2018-04-14 | Discharge: 2018-04-14 | Disposition: A | Discharge status: Home / Self Care | Payer: Medicaid Other | Source: Ambulatory Visit | Attending: Obstetrics and Gynecology | Admitting: Obstetrics and Gynecology

## 2018-04-14 ENCOUNTER — Ambulatory Visit (INDEPENDENT_AMBULATORY_CARE_PROVIDER_SITE_OTHER): Payer: Medicaid Other | Admitting: Obstetrics and Gynecology

## 2018-04-14 ENCOUNTER — Other Ambulatory Visit: Payer: Self-pay

## 2018-04-14 VITALS — BP 122/75 | HR 113 | Wt 196.5 lb

## 2018-04-14 DIAGNOSIS — Z3483 Encounter for supervision of other normal pregnancy, third trimester: Secondary | ICD-10-CM

## 2018-04-14 DIAGNOSIS — Z3480 Encounter for supervision of other normal pregnancy, unspecified trimester: Secondary | ICD-10-CM

## 2018-04-14 DIAGNOSIS — Z23 Encounter for immunization: Secondary | ICD-10-CM

## 2018-04-14 LAB — OB RESULTS CONSOLE GC/CHLAMYDIA: Gonorrhea: NEGATIVE

## 2018-04-14 LAB — OB RESULTS CONSOLE GBS: STREP GROUP B AG: NEGATIVE

## 2018-04-14 NOTE — Progress Notes (Signed)
   PRENATAL VISIT NOTE  Subjective:  Brittany Bowers is a 22 y.o. G2P1001 at [redacted]w[redacted]d being seen today for ongoing prenatal care.  She is currently monitored for the following issues for this low-risk pregnancy and has NSVD (normal spontaneous vaginal delivery); Supervision of other normal pregnancy, antepartum; and Anxiety on their problem list.  Patient reports no complaints.  Contractions: Irritability. Vag. Bleeding: None.  Movement: Present. Denies leaking of fluid.   The following portions of the patient's history were reviewed and updated as appropriate: allergies, current medications, past family history, past medical history, past social history, past surgical history and problem list. Problem list updated.  Objective:   Vitals:   04/14/18 1634 04/14/18 1637  BP: 137/88 122/75  Pulse: (!) 109 (!) 113  Weight: 196 lb 8 oz (89.1 kg)     Fetal Status: Fetal Heart Rate (bpm): 149 Fundal Height: 37 cm Movement: Present  Presentation: Vertex  General:  Alert, oriented and cooperative. Patient is in no acute distress.  Skin: Skin is warm and dry. No rash noted.   Cardiovascular: Normal heart rate noted  Respiratory: Normal respiratory effort, no problems with respiration noted  Abdomen: Soft, gravid, appropriate for gestational age.  Pain/Pressure: Present     Pelvic: Cervical exam performed Dilation: Closed Effacement (%): Thick Station: Ballotable  Extremities: Normal range of motion.  Edema: Trace  Mental Status: Normal mood and affect. Normal behavior. Normal judgment and thought content.   Assessment and Plan:  Pregnancy: G2P1001 at [redacted]w[redacted]d  1. Supervision of other normal pregnancy, antepartum - Tdap vaccine greater than or equal to 7yo IM - GC/Chlamydia probe amp ([place])not at [facility] - Culture, beta strep (group b only)  Preterm labor symptoms and general obstetric precautions including but not limited to vaginal bleeding, contractions, leaking of fluid and fetal  movement were reviewed in detail with the patient. Please refer to After Visit Summary for other counseling recommendations.  Return in about 1 week (around 04/21/2018) for Return OB visit.  Future Appointments  Date Time Provider Department Center  04/20/2018  3:15 PM Currie Paris, NP [facility]  04/27/2018  4:15 PM Judeth Horn, NP [facility]  05/04/2018  4:15 PM Crisoforo Oxford, Charlesetta Garibaldi, CNM [facility]  05/11/2018  4:15 PM Crisoforo Oxford, Charlesetta Garibaldi, CNM [facility]    Raelyn Mora, CNM

## 2018-04-14 NOTE — Patient Instructions (Signed)
Braxton Hicks Contractions °Contractions of the uterus can occur throughout pregnancy, but they are not always a sign that you are in labor. You may have practice contractions called Braxton Hicks contractions. These false labor contractions are sometimes confused with true labor. °What are Braxton Hicks contractions? °Braxton Hicks contractions are tightening movements that occur in the muscles of the uterus before labor. Unlike true labor contractions, these contractions do not result in opening (dilation) and thinning of the cervix. Toward the end of pregnancy (32-34 weeks), Braxton Hicks contractions can happen more often and may become stronger. These contractions are sometimes difficult to tell apart from true labor because they can be very uncomfortable. You should not feel embarrassed if you go to the hospital with false labor. °Sometimes, the only way to tell if you are in true labor is for your health care provider to look for changes in the cervix. The health care provider will do a physical exam and may monitor your contractions. If you are not in true labor, the exam should show that your cervix is not dilating and your water has not broken. °If there are other health problems associated with your pregnancy, it is completely safe for you to be sent home with false labor. You may continue to have Braxton Hicks contractions until you go into true labor. °How to tell the difference between true labor and false labor °True labor °· Contractions last 30-70 seconds. °· Contractions become very regular. °· Discomfort is usually felt in the top of the uterus, and it spreads to the lower abdomen and low back. °· Contractions do not go away with walking. °· Contractions usually become more intense and increase in frequency. °· The cervix dilates and gets thinner. °False labor °· Contractions are usually shorter and not as strong as true labor contractions. °· Contractions are usually irregular. °· Contractions  are often felt in the front of the lower abdomen and in the groin. °· Contractions may go away when you walk around or change positions while lying down. °· Contractions get weaker and are shorter-lasting as time goes on. °· The cervix usually does not dilate or become thin. °Follow these instructions at home: °· Take over-the-counter and prescription medicines only as told by your health care provider. °· Keep up with your usual exercises and follow other instructions from your health care provider. °· Eat and drink lightly if you think you are going into labor. °· If Braxton Hicks contractions are making you uncomfortable: °? Change your position from lying down or resting to walking, or change from walking to resting. °? Sit and rest in a tub of warm water. °? Drink enough fluid to keep your urine pale yellow. Dehydration may cause these contractions. °? Do slow and deep breathing several times an hour. °· Keep all follow-up prenatal visits as told by your health care provider. This is important. °Contact a health care provider if: °· You have a fever. °· You have continuous pain in your abdomen. °Get help right away if: °· Your contractions become stronger, more regular, and closer together. °· You have fluid leaking or gushing from your vagina. °· You pass blood-tinged mucus (bloody show). °· You have bleeding from your vagina. °· You have low back pain that you never had before. °· You feel your baby’s head pushing down and causing pelvic pressure. °· Your baby is not moving inside you as much as it used to. °Summary °· Contractions that occur before labor are called Braxton   Hicks contractions, false labor, or practice contractions. °· Braxton Hicks contractions are usually shorter, weaker, farther apart, and less regular than true labor contractions. True labor contractions usually become progressively stronger and regular and they become more frequent. °· Manage discomfort from Braxton Hicks contractions by  changing position, resting in a warm bath, drinking plenty of water, or practicing deep breathing. °This information is not intended to replace advice given to you by your health care provider. Make sure you discuss any questions you have with your health care provider. °Document Released: 03/17/2017 Document Revised: 03/17/2017 Document Reviewed: 03/17/2017 °Elsevier Interactive Patient Education © 2018 [employer] ° °

## 2018-04-18 LAB — CULTURE, BETA STREP (GROUP B ONLY): Strep Gp B Culture: NEGATIVE

## 2018-04-18 LAB — GC/CHLAMYDIA PROBE AMP (~~LOC~~) NOT AT ARMC
Chlamydia: NEGATIVE
NEISSERIA GONORRHEA: NEGATIVE

## 2018-04-20 ENCOUNTER — Ambulatory Visit (INDEPENDENT_AMBULATORY_CARE_PROVIDER_SITE_OTHER): Payer: Medicaid Other | Admitting: Nurse Practitioner

## 2018-04-20 VITALS — BP 125/73 | HR 105 | Wt 202.1 lb

## 2018-04-20 DIAGNOSIS — O26843 Uterine size-date discrepancy, third trimester: Secondary | ICD-10-CM

## 2018-04-20 DIAGNOSIS — Z3480 Encounter for supervision of other normal pregnancy, unspecified trimester: Secondary | ICD-10-CM

## 2018-04-20 DIAGNOSIS — Z3483 Encounter for supervision of other normal pregnancy, third trimester: Secondary | ICD-10-CM

## 2018-04-20 MED ORDER — COMFORT FIT MATERNITY SUPP LG MISC: 0 refills | Status: DC

## 2018-04-20 NOTE — Progress Notes (Signed)
Pt states pain in right leg

## 2018-04-20 NOTE — Progress Notes (Signed)
    Subjective:  Brittany Bowers is a 22 y.o. G2P1001 at 4919w0d being seen today for ongoing prenatal care.  She is currently monitored for the following issues for this low-risk pregnancy and has Supervision of other normal pregnancy, antepartum and Anxiety on their problem list.  Patient reports pelvic pain.  Contractions: Irritability. Vag. Bleeding: None.  Movement: Present. Denies leaking of fluid. Pain is causing her problems for the past few days.  Reports more pain when stepping into the tub or into the car.  The following portions of the patient's history were reviewed and updated as appropriate: allergies, current medications, past family history, past medical history, past social history, past surgical history and problem list. Problem list updated.  Objective:   Vitals:   04/20/18 1546  BP: 125/73  Pulse: (!) 105  Weight: 202 lb 1.6 oz (91.7 kg)    Fetal Status: Fetal Heart Rate (bpm): 145 Fundal Height: 41 cm Movement: Present  Presentation: Vertex  General:  Alert, oriented and cooperative. Patient is in no acute distress.  Skin: Skin is warm and dry. No rash noted.   Cardiovascular: Normal heart rate noted  Respiratory: Normal respiratory effort, no problems with respiration noted  Abdomen: Soft, gravid, appropriate for gestational age. Pain/Pressure: Present     Pain on pressure to pubic symphysis.  Radiating to right hip  Pelvic:  Cervical exam performed Dilation: Closed Effacement (%): Thick Station: -3  Extremities: Normal range of motion.  Edema: Trace  Mental Status: Normal mood and affect. Normal behavior. Normal judgment and thought content.   Urinalysis:      Assessment and Plan:  Pregnancy: G2P1001 at 2519w0d  1. Supervision of other normal pregnancy, antepartum Feeling the baby move, occasional contractions  2. Uterine size date discrepancy pregnancy, third trimester Will get ultrasound for interval growth and AFI  3.  Pubic symphysis  discomfort Prescribed maternity support belt Demonstrated exercises Recommended using exercise ball or being in a swimming pool and using leg motions like riding a bicycle  Term labor symptoms and general obstetric precautions including but not limited to vaginal bleeding, contractions, leaking of fluid and fetal movement were reviewed in detail with the patient. Please refer to After Visit Summary for other counseling recommendations.  Return in about 1 week (around 04/27/2018).  Nolene BernheimERRI BURLESON, RN, MSN, NP-BC Nurse Practitioner, [facility] for [facility], [facility] 04/21/2018 2:18 PM

## 2018-04-20 NOTE — Patient Instructions (Signed)
Braxton Hicks Contractions °Contractions of the uterus can occur throughout pregnancy, but they are not always a sign that you are in labor. You may have practice contractions called Braxton Hicks contractions. These false labor contractions are sometimes confused with true labor. °What are Braxton Hicks contractions? °Braxton Hicks contractions are tightening movements that occur in the muscles of the uterus before labor. Unlike true labor contractions, these contractions do not result in opening (dilation) and thinning of the cervix. Toward the end of pregnancy (32-34 weeks), Braxton Hicks contractions can happen more often and may become stronger. These contractions are sometimes difficult to tell apart from true labor because they can be very uncomfortable. You should not feel embarrassed if you go to the hospital with false labor. °Sometimes, the only way to tell if you are in true labor is for your health care provider to look for changes in the cervix. The health care provider will do a physical exam and may monitor your contractions. If you are not in true labor, the exam should show that your cervix is not dilating and your water has not broken. °If there are other health problems associated with your pregnancy, it is completely safe for you to be sent home with false labor. You may continue to have Braxton Hicks contractions until you go into true labor. °How to tell the difference between true labor and false labor °True labor °· Contractions last 30-70 seconds. °· Contractions become very regular. °· Discomfort is usually felt in the top of the uterus, and it spreads to the lower abdomen and low back. °· Contractions do not go away with walking. °· Contractions usually become more intense and increase in frequency. °· The cervix dilates and gets thinner. °False labor °· Contractions are usually shorter and not as strong as true labor contractions. °· Contractions are usually irregular. °· Contractions  are often felt in the front of the lower abdomen and in the groin. °· Contractions may go away when you walk around or change positions while lying down. °· Contractions get weaker and are shorter-lasting as time goes on. °· The cervix usually does not dilate or become thin. °Follow these instructions at home: °· Take over-the-counter and prescription medicines only as told by your health care provider. °· Keep up with your usual exercises and follow other instructions from your health care provider. °· Eat and drink lightly if you think you are going into labor. °· If Braxton Hicks contractions are making you uncomfortable: °? Change your position from lying down or resting to walking, or change from walking to resting. °? Sit and rest in a tub of warm water. °? Drink enough fluid to keep your urine pale yellow. Dehydration may cause these contractions. °? Do slow and deep breathing several times an hour. °· Keep all follow-up prenatal visits as told by your health care provider. This is important. °Contact a health care provider if: °· You have a fever. °· You have continuous pain in your abdomen. °Get help right away if: °· Your contractions become stronger, more regular, and closer together. °· You have fluid leaking or gushing from your vagina. °· You pass blood-tinged mucus (bloody show). °· You have bleeding from your vagina. °· You have low back pain that you never had before. °· You feel your baby’s head pushing down and causing pelvic pressure. °· Your baby is not moving inside you as much as it used to. °Summary °· Contractions that occur before labor are called Braxton   Hicks contractions, false labor, or practice contractions. °· Braxton Hicks contractions are usually shorter, weaker, farther apart, and less regular than true labor contractions. True labor contractions usually become progressively stronger and regular and they become more frequent. °· Manage discomfort from Braxton Hicks contractions by  changing position, resting in a warm bath, drinking plenty of water, or practicing deep breathing. °This information is not intended to replace advice given to you by your health care provider. Make sure you discuss any questions you have with your health care provider. °Document Released: 03/17/2017 Document Revised: 03/17/2017 Document Reviewed: 03/17/2017 °Elsevier Interactive Patient Education © 2018 [employer] ° °

## 2018-04-24 ENCOUNTER — Other Ambulatory Visit: Payer: Self-pay | Admitting: Nurse Practitioner

## 2018-04-24 ENCOUNTER — Ambulatory Visit (HOSPITAL_COMMUNITY)
Admission: RE | Admit: 2018-04-24 | Discharge: 2018-04-24 | Disposition: A | Discharge status: Home / Self Care | Payer: Medicaid Other | Source: Ambulatory Visit | Attending: Nurse Practitioner | Admitting: Nurse Practitioner

## 2018-04-24 DIAGNOSIS — O09299 Supervision of pregnancy with other poor reproductive or obstetric history, unspecified trimester: Secondary | ICD-10-CM

## 2018-04-24 DIAGNOSIS — O09293 Supervision of pregnancy with other poor reproductive or obstetric history, third trimester: Secondary | ICD-10-CM | POA: Insufficient documentation

## 2018-04-24 DIAGNOSIS — Z3A37 37 weeks gestation of pregnancy: Secondary | ICD-10-CM

## 2018-04-24 DIAGNOSIS — O365930 Maternal care for other known or suspected poor fetal growth, third trimester, not applicable or unspecified: Secondary | ICD-10-CM

## 2018-04-24 DIAGNOSIS — O26843 Uterine size-date discrepancy, third trimester: Secondary | ICD-10-CM

## 2018-04-24 DIAGNOSIS — Z362 Encounter for other antenatal screening follow-up: Secondary | ICD-10-CM

## 2018-04-25 ENCOUNTER — Other Ambulatory Visit (HOSPITAL_COMMUNITY): Payer: Self-pay | Admitting: *Deleted

## 2018-04-25 DIAGNOSIS — O365930 Maternal care for other known or suspected poor fetal growth, third trimester, not applicable or unspecified: Secondary | ICD-10-CM

## 2018-04-27 ENCOUNTER — Ambulatory Visit (INDEPENDENT_AMBULATORY_CARE_PROVIDER_SITE_OTHER): Payer: Medicaid Other | Admitting: Student

## 2018-04-27 ENCOUNTER — Encounter: Payer: Self-pay | Admitting: Student

## 2018-04-27 VITALS — BP 139/87 | HR 112 | Wt 203.0 lb

## 2018-04-27 DIAGNOSIS — Z3483 Encounter for supervision of other normal pregnancy, third trimester: Secondary | ICD-10-CM

## 2018-04-27 DIAGNOSIS — O365930 Maternal care for other known or suspected poor fetal growth, third trimester, not applicable or unspecified: Secondary | ICD-10-CM

## 2018-04-27 DIAGNOSIS — Z3480 Encounter for supervision of other normal pregnancy, unspecified trimester: Secondary | ICD-10-CM

## 2018-04-27 NOTE — Progress Notes (Signed)
   PRENATAL VISIT NOTE  Subjective:  Brittany Bowers is a 22 y.o. G2P1001 at 17100w0d being seen today for ongoing prenatal care.  She is currently monitored for the following issues for this high-risk pregnancy and has Supervision of other normal pregnancy, antepartum; Anxiety; Uterine size date discrepancy pregnancy, third trimester; [redacted] weeks gestation of pregnancy; Pregnancy with poor obstetric history; and Poor fetal growth affecting management of mother in third trimester on their problem list.  Patient reports no complaints.  Contractions: Not present. Vag. Bleeding: None.  Movement: Present. Denies leaking of fluid.   The following portions of the patient's history were reviewed and updated as appropriate: allergies, current medications, past family history, past medical history, past social history, past surgical history and problem list. Problem list updated.  Objective:   Vitals:   04/27/18 1642  BP: 139/87  Pulse: (!) 112  Weight: 203 lb (92.1 kg)    Fetal Status: Fetal Heart Rate (bpm): 160 Fundal Height: 38 cm Movement: Present     General:  Alert, oriented and cooperative. Patient is in no acute distress.  Skin: Skin is warm and dry. No rash noted.   Cardiovascular: Normal heart rate noted  Respiratory: Normal respiratory effort, no problems with respiration noted  Abdomen: Soft, gravid, appropriate for gestational age.  Pain/Pressure: Present     Pelvic: Cervical exam deferred        Extremities: Normal range of motion.  Edema: Trace  Mental Status: Normal mood and affect. Normal behavior. Normal judgment and thought content.   Assessment and Plan:  Pregnancy: G2P1001 at 52100w0d  1. Supervision of other normal pregnancy, antepartum   2. Poor fetal growth affecting management of mother in third trimester, single or unspecified fetus -IOL at 39 wks per MFM d/t IUGR (AC <3rd percentile)    Term labor symptoms and general obstetric precautions including but not limited  to vaginal bleeding, contractions, leaking of fluid and fetal movement were reviewed in detail with the patient. Please refer to After Visit Summary for other counseling recommendations.  Return for being scheduled for IOL> Cancel future OB appts.  Future Appointments  Date Time Provider Department Center  05/01/2018  1:30 PM [facility] US 1 [facility]  05/04/2018  4:15 PM Marylene LandKooistra, Kathryn Lorraine, CNM [facility]  05/11/2018  4:15 PM Crisoforo OxfordKooistra, Charlesetta GaribaldiKathryn Lorraine, CNM [facility]    Judeth HornErin Tyeshia Cornforth, NP

## 2018-04-27 NOTE — Patient Instructions (Signed)
Labor Induction Labor induction is when steps are taken to cause a pregnant woman to begin the labor process. Most women go into labor on their own between 37 weeks and 42 weeks of the pregnancy. When this does not happen or when there is a medical need, methods may be used to induce labor. Labor induction causes a pregnant woman's uterus to contract. It also causes the cervix to soften (ripen), open (dilate), and thin out (efface). Usually, labor is not induced before 39 weeks of the pregnancy unless there is a problem with the baby or mother. Before inducing labor, your health care provider will consider a number of factors, including the following:  The medical condition of you and the baby.  How many weeks along you are.  The status of the baby's lung maturity.  The condition of the cervix.  The position of the baby. What are the reasons for labor induction? Labor may be induced for the following reasons:  The health of the baby or mother is at risk.  The pregnancy is overdue by 1 week or more.  The water breaks but labor does not start on its own.  The mother has a health condition or serious illness, such as high blood pressure, infection, placental abruption, or diabetes.  The amniotic fluid amounts are low around the baby.  The baby is distressed. Convenience or wanting the baby to be born on a certain date is not a reason for inducing labor. What methods are used for labor induction? Several methods of labor induction may be used, such as:  Prostaglandin medicine. This medicine causes the cervix to dilate and ripen. The medicine will also start contractions. It can be taken by mouth or by inserting a suppository into the vagina.  Inserting a thin tube (catheter) with a balloon on the end into the vagina to dilate the cervix. Once inserted, the balloon is expanded with water, which causes the cervix to open.  Stripping the membranes. Your health care provider separates  amniotic sac tissue from the cervix, causing the cervix to be stretched and causing the release of a hormone called progesterone. This may cause the uterus to contract. It is often done during an office visit. You will be sent home to wait for the contractions to begin. You will then come in for an induction.  Breaking the water. Your health care provider makes a hole in the amniotic sac using a small instrument. Once the amniotic sac breaks, contractions should begin. This may still take hours to see an effect.  Medicine to trigger or strengthen contractions. This medicine is given through an IV access tube inserted into a vein in your arm. All of the methods of induction, besides stripping the membranes, will be done in the hospital. Induction is done in the hospital so that you and the baby can be carefully monitored. How long does it take for labor to be induced? Some inductions can take up to 2-3 days. Depending on the cervix, it usually takes less time. It takes longer when you are induced early in the pregnancy or if this is your first pregnancy. If a mother is still pregnant and the induction has been going on for 2-3 days, either the mother will be sent home or a cesarean delivery will be needed. What are the risks associated with labor induction? Some of the risks of induction include:  Changes in fetal heart rate, such as too high, too low, or erratic.  Fetal distress.    Chance of infection for the mother and baby.  Increased chance of having a cesarean delivery.  Breaking off (abruption) of the placenta from the uterus (rare).  Uterine rupture (very rare). When induction is needed for medical reasons, the benefits of induction may outweigh the risks. What are some reasons for not inducing labor? Labor induction should not be done if:  It is shown that your baby does not tolerate labor.  You have had previous surgeries on your uterus, such as a myomectomy or the removal of  fibroids.  Your placenta lies very low in the uterus and blocks the opening of the cervix (placenta previa).  Your baby is not in a head-down position.  The umbilical cord drops down into the birth canal in front of the baby. This could cut off the baby's blood and oxygen supply.  You have had a previous cesarean delivery.  There are unusual circumstances, such as the baby being extremely premature. This information is not intended to replace advice given to you by your health care provider. Make sure you discuss any questions you have with your health care provider. Document Released: 03/23/2007 Document Revised: 04/08/2016 Document Reviewed: 05/31/2013 Elsevier Interactive Patient Education  2017 Elsevier Inc.  

## 2018-04-28 ENCOUNTER — Telehealth: Payer: Self-pay

## 2018-04-28 ENCOUNTER — Telehealth (HOSPITAL_COMMUNITY): Payer: Self-pay | Admitting: *Deleted

## 2018-04-28 ENCOUNTER — Encounter (HOSPITAL_COMMUNITY): Payer: Self-pay | Admitting: *Deleted

## 2018-04-28 NOTE — Telephone Encounter (Signed)
Preadmission screen  

## 2018-04-28 NOTE — Telephone Encounter (Signed)
Notified pt that her IOL is scheduled for June 21st @ 0630.  Pt stated thank you and did not have any other questions

## 2018-05-01 ENCOUNTER — Encounter (HOSPITAL_COMMUNITY): Payer: Self-pay

## 2018-05-01 ENCOUNTER — Ambulatory Visit (HOSPITAL_COMMUNITY): Payer: Medicaid Other | Attending: Nurse Practitioner

## 2018-05-04 ENCOUNTER — Encounter: Payer: Self-pay | Admitting: Student

## 2018-05-05 ENCOUNTER — Inpatient Hospital Stay (HOSPITAL_COMMUNITY)
Admission: RE | Admit: 2018-05-05 | Discharge: 2018-05-07 | DRG: 807 | Disposition: A | Discharge status: Home / Self Care | Payer: Medicaid Other | Attending: Obstetrics and Gynecology | Admitting: Obstetrics and Gynecology

## 2018-05-05 ENCOUNTER — Inpatient Hospital Stay (HOSPITAL_COMMUNITY): Payer: Medicaid Other | Admitting: Anesthesiology

## 2018-05-05 DIAGNOSIS — O9952 Diseases of the respiratory system complicating childbirth: Secondary | ICD-10-CM | POA: Diagnosis present

## 2018-05-05 DIAGNOSIS — O99344 Other mental disorders complicating childbirth: Secondary | ICD-10-CM | POA: Diagnosis present

## 2018-05-05 DIAGNOSIS — J45909 Unspecified asthma, uncomplicated: Secondary | ICD-10-CM | POA: Diagnosis present

## 2018-05-05 DIAGNOSIS — Z87891 Personal history of nicotine dependence: Secondary | ICD-10-CM

## 2018-05-05 DIAGNOSIS — Z3490 Encounter for supervision of normal pregnancy, unspecified, unspecified trimester: Secondary | ICD-10-CM | POA: Diagnosis present

## 2018-05-05 DIAGNOSIS — Z3A39 39 weeks gestation of pregnancy: Secondary | ICD-10-CM | POA: Diagnosis not present

## 2018-05-05 DIAGNOSIS — O365930 Maternal care for other known or suspected poor fetal growth, third trimester, not applicable or unspecified: Secondary | ICD-10-CM | POA: Diagnosis present

## 2018-05-05 DIAGNOSIS — F419 Anxiety disorder, unspecified: Secondary | ICD-10-CM | POA: Diagnosis present

## 2018-05-05 DIAGNOSIS — O26843 Uterine size-date discrepancy, third trimester: Secondary | ICD-10-CM | POA: Diagnosis present

## 2018-05-05 LAB — CBC
HCT: 32.2 % — ABNORMAL LOW (ref 36.0–46.0)
HCT: 33.7 % — ABNORMAL LOW (ref 36.0–46.0)
HEMOGLOBIN: 10.5 g/dL — AB (ref 12.0–15.0)
Hemoglobin: 11 g/dL — ABNORMAL LOW (ref 12.0–15.0)
MCH: 27.6 pg (ref 26.0–34.0)
MCH: 27.5 pg (ref 26.0–34.0)
MCHC: 32.6 g/dL (ref 30.0–36.0)
MCHC: 32.6 g/dL (ref 30.0–36.0)
MCV: 84.5 fL (ref 78.0–100.0)
MCV: 84.3 fL (ref 78.0–100.0)
PLATELETS: 232 10*3/uL (ref 150–400)
Platelets: 238 10*3/uL (ref 150–400)
RBC: 3.81 MIL/uL — AB (ref 3.87–5.11)
RBC: 4 MIL/uL (ref 3.87–5.11)
RDW: 14.6 % (ref 11.5–15.5)
RDW: 14.6 % (ref 11.5–15.5)
WBC: 12.1 10*3/uL — AB (ref 4.0–10.5)
WBC: 12.4 10*3/uL — ABNORMAL HIGH (ref 4.0–10.5)

## 2018-05-05 LAB — COMPREHENSIVE METABOLIC PANEL
ALK PHOS: 145 U/L — AB (ref 38–126)
ALT: 16 U/L (ref 14–54)
ANION GAP: 10 (ref 5–15)
AST: 21 U/L (ref 15–41)
Albumin: 3 g/dL — ABNORMAL LOW (ref 3.5–5.0)
BILIRUBIN TOTAL: 0.4 mg/dL (ref 0.3–1.2)
BUN: 6 mg/dL (ref 6–20)
CALCIUM: 9 mg/dL (ref 8.9–10.3)
CO2: 22 mmol/L (ref 22–32)
CREATININE: 0.58 mg/dL (ref 0.44–1.00)
Chloride: 103 mmol/L (ref 101–111)
Glucose, Bld: 106 mg/dL — ABNORMAL HIGH (ref 65–99)
Potassium: 3.5 mmol/L (ref 3.5–5.1)
SODIUM: 135 mmol/L (ref 135–145)
TOTAL PROTEIN: 7.2 g/dL (ref 6.5–8.1)

## 2018-05-05 LAB — RPR: RPR: NONREACTIVE

## 2018-05-05 LAB — TYPE AND SCREEN
ABO/RH(D): O POS
ANTIBODY SCREEN: NEGATIVE

## 2018-05-05 MED ORDER — FLEET ENEMA 7-19 GM/118ML RE ENEM: 1.0000 | ENEMA | RECTAL | Status: DC | PRN

## 2018-05-05 MED ORDER — LIDOCAINE HCL (PF) 1 % IJ SOLN
INTRAMUSCULAR | Status: DC | PRN
  Administered 2018-05-05 (×2): 5 mL via EPIDURAL

## 2018-05-05 MED ORDER — ACETAMINOPHEN 325 MG PO TABS: 650.0000 mg | ORAL_TABLET | ORAL | Status: DC | PRN

## 2018-05-05 MED ORDER — ONDANSETRON HCL 4 MG/2ML IJ SOLN
4.0000 mg | Freq: Four times a day (QID) | INTRAMUSCULAR | Status: DC | PRN
  Administered 2018-05-06: 4 mg via INTRAVENOUS
  Filled 2018-05-05: qty 2

## 2018-05-05 MED ORDER — EPHEDRINE 5 MG/ML INJ
10.0000 mg | INTRAVENOUS | Status: DC | PRN
  Filled 2018-05-05: qty 2

## 2018-05-05 MED ORDER — OXYCODONE-ACETAMINOPHEN 5-325 MG PO TABS: 1.0000 | ORAL_TABLET | ORAL | Status: DC | PRN

## 2018-05-05 MED ORDER — OXYCODONE-ACETAMINOPHEN 5-325 MG PO TABS: 2.0000 | ORAL_TABLET | ORAL | Status: DC | PRN

## 2018-05-05 MED ORDER — LACTATED RINGERS IV SOLN
INTRAVENOUS | Status: DC
  Administered 2018-05-05: 08:00:00 via INTRAVENOUS

## 2018-05-05 MED ORDER — LIDOCAINE HCL (PF) 1 % IJ SOLN
30.0000 mL | INTRAMUSCULAR | Status: DC | PRN
  Filled 2018-05-05: qty 30

## 2018-05-05 MED ORDER — PHENYLEPHRINE 40 MCG/ML (10ML) SYRINGE FOR IV PUSH (FOR BLOOD PRESSURE SUPPORT)
80.0000 ug | PREFILLED_SYRINGE | INTRAVENOUS | Status: DC | PRN
  Filled 2018-05-05: qty 10
  Filled 2018-05-05: qty 5

## 2018-05-05 MED ORDER — DIPHENHYDRAMINE HCL 50 MG/ML IJ SOLN
12.5000 mg | INTRAMUSCULAR | Status: DC | PRN
  Administered 2018-05-05: 12.5 mg via INTRAVENOUS
  Filled 2018-05-05: qty 1

## 2018-05-05 MED ORDER — SCOPOLAMINE 1 MG/3DAYS TD PT72
MEDICATED_PATCH | TRANSDERMAL | Status: AC
  Filled 2018-05-05: qty 2

## 2018-05-05 MED ORDER — FENTANYL 2.5 MCG/ML BUPIVACAINE 1/10 % EPIDURAL INFUSION (WH - ANES)
14.0000 mL/h | INTRAMUSCULAR | Status: DC | PRN
  Administered 2018-05-05: 14 mL/h via EPIDURAL
  Filled 2018-05-05: qty 100

## 2018-05-05 MED ORDER — MISOPROSTOL 25 MCG QUARTER TABLET
25.0000 ug | ORAL_TABLET | ORAL | Status: DC | PRN
  Administered 2018-05-05 (×2): 25 ug via VAGINAL
  Filled 2018-05-05 (×3): qty 1

## 2018-05-05 MED ORDER — AMMONIA AROMATIC IN INHA
RESPIRATORY_TRACT | Status: AC
  Filled 2018-05-05: qty 10

## 2018-05-05 MED ORDER — OXYTOCIN 40 UNITS IN LACTATED RINGERS INFUSION - SIMPLE MED: 1.0000 m[IU]/min | INTRAVENOUS | Status: DC

## 2018-05-05 MED ORDER — OXYTOCIN 40 UNITS IN LACTATED RINGERS INFUSION - SIMPLE MED
2.5000 [IU]/h | INTRAVENOUS | Status: DC
  Administered 2018-05-06: 2.5 [IU]/h via INTRAVENOUS
  Filled 2018-05-05: qty 1000

## 2018-05-05 MED ORDER — LACTATED RINGERS IV SOLN
500.0000 mL | Freq: Once | INTRAVENOUS | Status: AC
  Administered 2018-05-05: 500 mL via INTRAVENOUS

## 2018-05-05 MED ORDER — TERBUTALINE SULFATE 1 MG/ML IJ SOLN: 0.2500 mg | Freq: Once | INTRAMUSCULAR | Status: DC | PRN

## 2018-05-05 MED ORDER — SOD CITRATE-CITRIC ACID 500-334 MG/5ML PO SOLN: 30.0000 mL | ORAL | Status: DC | PRN

## 2018-05-05 MED ORDER — PHENYLEPHRINE 40 MCG/ML (10ML) SYRINGE FOR IV PUSH (FOR BLOOD PRESSURE SUPPORT)
80.0000 ug | PREFILLED_SYRINGE | INTRAVENOUS | Status: DC | PRN
  Filled 2018-05-05: qty 5

## 2018-05-05 MED ORDER — FENTANYL CITRATE (PF) 100 MCG/2ML IJ SOLN
100.0000 ug | INTRAMUSCULAR | Status: DC | PRN
  Administered 2018-05-05: 100 ug via INTRAVENOUS
  Filled 2018-05-05: qty 2

## 2018-05-05 MED ORDER — LACTATED RINGERS IV SOLN: 500.0000 mL | INTRAVENOUS | Status: DC | PRN

## 2018-05-05 MED ORDER — TERBUTALINE SULFATE 1 MG/ML IJ SOLN
0.2500 mg | Freq: Once | INTRAMUSCULAR | Status: DC | PRN
  Filled 2018-05-05: qty 1

## 2018-05-05 MED ORDER — DEXAMETHASONE SODIUM PHOSPHATE 10 MG/ML IJ SOLN
INTRAMUSCULAR | Status: AC
  Filled 2018-05-05: qty 2

## 2018-05-05 MED ORDER — IBUPROFEN 600 MG PO TABS: 600.0000 mg | ORAL_TABLET | Freq: Four times a day (QID) | ORAL | Status: DC

## 2018-05-05 MED ORDER — OXYTOCIN BOLUS FROM INFUSION
500.0000 mL | Freq: Once | INTRAVENOUS | Status: AC
  Administered 2018-05-05: 500 mL via INTRAVENOUS

## 2018-05-05 NOTE — H&P (Signed)
Brittany Bowers is a 22 y.o. female G2P1001 @ 39.1wks by 5.5wk scan presenting for IOL due to IUGR (EFW 15%, AC<3rd%, nl dopplers). This was first noted on 6/10 and has been uncomplicated. She denies leaking or bldg; no N/V/D or visual disturbances.   OB History    Gravida  2   Para  1   Term  1   Preterm      AB  0   Living  1     SAB  0   TAB      Ectopic      Multiple  0   Live Births  1          Past Medical History:  Diagnosis Date  . Asthma    uses inhaler twice/day   Past Surgical History:  Procedure Laterality Date  . TOE SURGERY Bilateral    Family History: family history includes Cancer in her maternal aunt and maternal grandfather; Diabetes in her father and maternal grandmother; Stroke in her maternal aunt and maternal grandmother. Social History:  reports that she quit smoking about 7 months ago. She smoked 0.50 packs per day. She has never used smokeless tobacco. She reports that she does not drink alcohol or use drugs.     Maternal Diabetes: No Genetic Screening: Normal Maternal Ultrasounds/Referrals: Abnormal:  Findings:   IUGR Fetal Ultrasounds or other Referrals:  Referred to Materal Fetal Medicine  Maternal Substance Abuse:  No Significant Maternal Medications:  None Significant Maternal Lab Results:  Lab values include: Group B Strep negative Other Comments:  None  ROS History Dilation: 1 Effacement (%): Thick Station: Ballotable Exam by:: Dr Shawnie PonsPratt  Blood pressure (!) 150/98, pulse 90, temperature 98.3 F (36.8 C), temperature source Oral, resp. rate 20, height 5\' 2"  (1.575 m), weight 93 kg (205 lb), last menstrual period 08/01/2017, unknown if currently breastfeeding.   BPs: 119/81, 130/75- before and after the elevated one Exam   Physical Exam  Constitutional: She is oriented to person, place, and time. She appears well-developed.  HENT:  Head: Normocephalic.  Neck: Normal range of motion.  Cardiovascular: Normal rate.   Respiratory: Effort normal.  GI:  EFM 130s, +accels, no decels, occ mi variables Ctx irreg, mild  Musculoskeletal: Normal range of motion.  Neurological: She is alert and oriented to person, place, and time.  Skin: Skin is warm and dry.  Psychiatric: She has a normal mood and affect. Her behavior is normal. Thought content normal.    Prenatal labs: ABO, Rh: --/--/O POS (06/21 0747) Antibody: NEG (06/21 0747) Rubella: 5.87 (12/17 1340) RPR: Non Reactive (05/06 1637)  HBsAg: Negative (12/17 1340)  HIV: Non Reactive (05/06 1637)  GBS: Negative (05/31 0000)   Assessment/Plan: IUP@39 .1wks IUGR- uncomplicated GBS neg Cx unfavorable  [facility] Plan cytotec/cervical foley for ripening; Pit/AROM prn Anticipate SVD   Crewe Heathman CNM 05/05/2018, 10:35 AM

## 2018-05-05 NOTE — Progress Notes (Signed)
Patient ID: Brittany HazySheneka M Bowers, female   DOB: 02/27/1996, 22 y.o.   MRN: [id number]  S/p cytotec x 2 doses; now feeling cramping much more  BP 134/83, other VSS FHR 150s, 10x10accels, decreased variability, Cat 1 Ctx q 2-4 mins Cx 2-3/70/-2  IUP@term  IUGR IOL process GBS neg  Cervical foley inserted without difficulty; anticipate it not to stay in too long Pit prn Anticipate SVD  SHAW, KIMBERLY CNM 05/05/2018 7:00 PM

## 2018-05-05 NOTE — Anesthesia Pain Management Evaluation Note (Signed)
  CRNA Pain Management Visit Note  Patient: Brittany Bowers, 22 y.o., female  "Hello [facility] am [facility]. We have an anesthesia team available at all times to provide care throughout the hospital, including epidural management and anesthesia for C-section. I don't know your plan for the delivery whether it a natural birth, water birth, IV sedation, nitrous supplementation, doula or epidural, but we want to meet your pain goals."   1.Was your pain managed to your expectations on prior hospitalizations?   Yes   2.What is your expectation for pain management during this hospitalization?     Epidural  3.How can we help you reach that goal? Support PRN  Record the patient's initial score and the patient's pain goal.   Pain: 0  Pain Goal: 2 [facility] wants you to be able to say your pain was always managed very well.  Brittany HumanLisa B [name] 05/05/2018

## 2018-05-05 NOTE — H&P (Signed)
LABOR ADMISSION HISTORY AND PHYSICAL  Brittany Bowers is a 22 y.o. female G2P1001 with IUP at 5274w1d by LMP presenting for IOL for IUGR. She reports +FMs, No LOF, no VB, no blurry vision, headaches or peripheral edema, and RUQ pain.  She plans on breast and bottle feeding. She request BTL for birth control.  Dating: By LMP --->  Estimated Date of Delivery: 05/11/18  [facility] Active Problem List   Diagnosis Date Noted  . Encounter for induction of labor 05/05/2018  . Uterine size date discrepancy pregnancy, third trimester   . [redacted] weeks gestation of pregnancy   . Pregnancy with poor obstetric history   . Poor fetal growth affecting management of mother in third trimester   . Supervision of other normal pregnancy, antepartum 10/31/2017  . Anxiety 10/31/2017   Past Medical History: Past Medical History:  Diagnosis Date  . Asthma    uses inhaler twice/day    Past Surgical History: Past Surgical History:  Procedure Laterality Date  . TOE SURGERY Bilateral     Obstetrical History: OB History    Gravida  2   Para  1   Term  1   Preterm      AB  0   Living  1     SAB  0   TAB      Ectopic      Multiple  0   Live Births  1           Social History: Social History   Socioeconomic History  . Marital status: Single    Spouse name: Not on file  . Number of children: Not on file  . Years of education: Not on file  . Highest education level: Not on file  Occupational History  . Not on file  Social Needs  . Financial resource strain: Not on file  . Food insecurity:    Worry: Not on file    Inability: Not on file  . Transportation needs:    Medical: Not on file    Non-medical: Not on file  Tobacco Use  . Smoking status: Former Smoker    Packs/day: 0.50    Last attempt to quit: 09/17/2017    Years since quitting: 0.6  . Smokeless tobacco: Never Used  Substance and Sexual Activity  . Alcohol use: No     Frequency: Never    Comment: occational  . Drug use: No    Types: Marijuana    Comment: last use 3 feb 19  . Sexual activity: Yes    Birth control/protection: None  Lifestyle  . Physical activity:    Days per week: Not on file    Minutes per session: Not on file  . Stress: Not on file  Relationships  . Social connections:    Talks on phone: Not on file    Gets together: Not on file    Attends religious service: Not on file    Active member of club or organization: Not on file    Attends meetings of clubs or organizations: Not on file    Relationship status: Not on file  Other Topics Concern  . Not on file  Social History Narrative  . Not on file    Family History: Family History  Problem Relation Age of Onset  . Diabetes Father   . Diabetes Maternal Grandmother   . Stroke Maternal Grandmother        TIA  . Cancer Maternal  Grandfather   . Cancer Maternal Aunt   . Stroke Maternal Aunt   . Alcohol abuse Neg Hx   . Arthritis Neg Hx   . Asthma Neg Hx   . Birth defects Neg Hx   . COPD Neg Hx   . Depression Neg Hx   . Drug abuse Neg Hx   . Early death Neg Hx   . Hearing loss Neg Hx   . Heart disease Neg Hx   . Hyperlipidemia Neg Hx   . Hypertension Neg Hx   . Kidney disease Neg Hx   . Learning disabilities Neg Hx   . Mental illness Neg Hx   . Mental retardation Neg Hx   . Miscarriages / Stillbirths Neg Hx   . Vision loss Neg Hx   . Varicose Veins Neg Hx     Allergies: Allergies  Allergen Reactions  . Bee Venom Swelling  . Onion Other (See Comments)    Upset stomach  . Pork-Derived Products Other (See Comments)    Religious preference    Medications Prior to Admission  Medication Sig Dispense Refill Last Dose  . diphenhydrAMINE (BENADRYL) 25 MG tablet Take 1 tablet (25 mg total) by mouth every 8 (eight) hours as needed. (Patient not taking: Reported on 04/27/2018) 20 tablet 0 Not Taking  . Elastic Bandages & Supports (COMFORT FIT MATERNITY SUPP LG) MISC  Needs one maternity support belt (Patient not taking: Reported on 04/27/2018) 1 each 0 Not Taking  . Prenat w/o A Vit-FeFum-FePo-FA (CONCEPT OB) 130-92.4-1 MG CAPS Take 1 tablet by mouth daily. 30 capsule 12 Taking  . ranitidine (ZANTAC) 150 MG tablet Take 1 tablet (150 mg total) by mouth 2 (two) times daily. 60 tablet 0 Taking    Review of Systems   All systems reviewed and negative except as stated in HPI  Physical Exam Last menstrual period 08/01/2017, unknown if currently breastfeeding. General appearance: alert, cooperative and no distress HEENT:  Abdomen: soft, non-tender; bowel sounds normal Extremities: Homans sign is negative, no sign of DVT Presentation:  Fetal monitoring Uterine activity   Prenatal labs: ABO, Rh: O/Positive/-- (12/17 1340) Antibody: Negative (12/17 1340) Rubella: 5.87 (12/17 1340) RPR: Non Reactive (05/06 1637)  HBsAg: Negative (12/17 1340)  HIV: Non Reactive (05/06 1637)  GBS:   negative 1 hr Glucola wnl  Prenatal Transfer Tool  Maternal Diabetes: No Genetic Screening: Normal Maternal Ultrasounds/Referrals: Abnormal:  Findings:   IUGR Fetal Ultrasounds or other Referrals:  Referred to Materal Fetal Medicine  Maternal Substance Abuse:  No Significant Maternal Medications:  None Significant Maternal Lab Results: None  No results found for this or any previous visit (from the past 24 hour(s)).  Patient Active Problem List   Diagnosis Date Noted  . Encounter for induction of labor 05/05/2018  . Uterine size date discrepancy pregnancy, third trimester   . [redacted] weeks gestation of pregnancy   . Pregnancy with poor obstetric history   . Poor fetal growth affecting management of mother in third trimester   . Supervision of other normal pregnancy, antepartum 10/31/2017  . Anxiety 10/31/2017    Assessment: Brittany Bowers is a 22 y.o. G2P1001 at [redacted]w[redacted]d here for IOL for IUGR  #Labor:Induction with cytotec,  #Pain: Supportive, considering  epidural #FWB: ** #ID:  GBS neg #MOF: Breast/Bottle #MOC:BTL #Circ:  Outpatient   Thomes Dinning, MD, MS FAMILY MEDICINE RESIDENT - PGY1 05/05/2018 7:09 AM

## 2018-05-05 NOTE — Anesthesia Preprocedure Evaluation (Signed)
Anesthesia Evaluation  Patient identified by MRN, date of birth, ID band Patient awake    Reviewed: Allergy & Precautions, H&P , NPO status , Patient's Chart, lab work & pertinent test results  History of Anesthesia Complications Negative for: history of anesthetic complications  Airway Mallampati: II  TM Distance: >3 FB Neck ROM: full    Dental no notable dental hx. (+) Teeth Intact   Pulmonary asthma , former smoker,    Pulmonary exam normal breath sounds clear to auscultation       Cardiovascular negative cardio ROS Normal cardiovascular exam Rhythm:regular Rate:Normal     Neuro/Psych negative neurological ROS  negative psych ROS   GI/Hepatic negative GI ROS, Neg liver ROS,   Endo/Other  negative endocrine ROS  Renal/GU negative Renal ROS  negative genitourinary   Musculoskeletal   Abdominal   Peds  Hematology negative hematology ROS (+)   Anesthesia Other Findings   Reproductive/Obstetrics (+) Pregnancy                             Anesthesia Physical Anesthesia Plan  ASA: II  Anesthesia Plan: Epidural   Post-op Pain Management:    Induction:   PONV Risk Score and Plan:   Airway Management Planned:   Additional Equipment:   Intra-op Plan:   Post-operative Plan:   Informed Consent: I have reviewed the patients History and Physical, chart, labs and discussed the procedure including the risks, benefits and alternatives for the proposed anesthesia with the patient or authorized representative who has indicated his/her understanding and acceptance.     Plan Discussed with:   Anesthesia Plan Comments:         Anesthesia Quick Evaluation  

## 2018-05-05 NOTE — Anesthesia Procedure Notes (Signed)
Epidural Patient location during procedure: OB  Staffing Anesthesiologist: Britian Jentz, MD Performed: anesthesiologist   Preanesthetic Checklist Completed: patient identified, site marked, surgical consent, pre-op evaluation, timeout performed, IV checked, risks and benefits discussed and monitors and equipment checked  Epidural Patient position: sitting Prep: DuraPrep Patient monitoring: heart rate, continuous pulse ox and blood pressure Approach: right paramedian Location: L3-L4 Injection technique: LOR saline  Needle:  Needle type: Tuohy  Needle gauge: 17 G Needle length: 9 cm and 9 Needle insertion depth: 6 cm Catheter type: closed end flexible Catheter size: 20 Guage Catheter at skin depth: 10 cm Test dose: negative  Assessment Events: blood not aspirated, injection not painful, no injection resistance, negative IV test and no paresthesia  Additional Notes Patient identified. Risks/Benefits/Options discussed with patient including but not limited to bleeding, infection, nerve damage, paralysis, failed block, incomplete pain control, headache, blood pressure changes, nausea, vomiting, reactions to medication both or allergic, itching and postpartum back pain. Confirmed with bedside nurse the patient's most recent platelet count. Confirmed with patient that they are not currently taking any anticoagulation, have any bleeding history or any family history of bleeding disorders. Patient expressed understanding and wished to proceed. All questions were answered. Sterile technique was used throughout the entire procedure. Please see nursing notes for vital signs. Test dose was given through epidural needle and negative prior to continuing to dose epidural or start infusion. Warning signs of high block given to the patient including shortness of breath, tingling/numbness in hands, complete motor block, or any concerning symptoms with instructions to call for help. Patient was given  instructions on fall risk and not to get out of bed. All questions and concerns addressed with instructions to call with any issues.     

## 2018-05-05 NOTE — Progress Notes (Signed)
Patient ID: Brittany Bowers, female   DOB: 1995-12-02, 22 y.o.   MRN: [id number]  Comfortable w/ epidural; cervical foley out at approx 2000  BP 151/92, P 117 FHR 140s, Cat 1  Ctx q 3-4 mins Cx 8/C/vtx 0; AROM for clear fluid  Pre-e labs repeated- stable  IUP@term  Now gHTN IUGR Active labor/transition  Anticipate SVD Watch for severe range BPs- may need Lita Mainsmag  Mable Dara CNM 05/05/2018

## 2018-05-06 ENCOUNTER — Encounter (HOSPITAL_COMMUNITY): Payer: Self-pay

## 2018-05-06 ENCOUNTER — Encounter (HOSPITAL_COMMUNITY)
Admission: RE | Disposition: A | Discharge status: Home / Self Care | Payer: Self-pay | Source: Home / Self Care | Attending: Obstetrics and Gynecology

## 2018-05-06 DIAGNOSIS — Z3A39 39 weeks gestation of pregnancy: Secondary | ICD-10-CM

## 2018-05-06 DIAGNOSIS — O365930 Maternal care for other known or suspected poor fetal growth, third trimester, not applicable or unspecified: Secondary | ICD-10-CM

## 2018-05-06 LAB — PROTEIN / CREATININE RATIO, URINE
Creatinine, Urine: 114 mg/dL
PROTEIN CREATININE RATIO: 0.12 mg/mg{creat} (ref 0.00–0.15)
TOTAL PROTEIN, URINE: 14 mg/dL

## 2018-05-06 SURGERY — LIGATION, FALLOPIAN TUBE, POSTPARTUM
Anesthesia: Epidural | Laterality: Bilateral

## 2018-05-06 MED ORDER — COCONUT OIL OIL: 1.0000 "application " | TOPICAL_OIL | Status: DC | PRN

## 2018-05-06 MED ORDER — SIMETHICONE 80 MG PO CHEW: 80.0000 mg | CHEWABLE_TABLET | ORAL | Status: DC | PRN

## 2018-05-06 MED ORDER — ONDANSETRON HCL 4 MG/2ML IJ SOLN: 4.0000 mg | INTRAMUSCULAR | Status: DC | PRN

## 2018-05-06 MED ORDER — MISOPROSTOL 200 MCG PO TABS
800.0000 ug | ORAL_TABLET | Freq: Once | ORAL | Status: AC
  Administered 2018-05-06: 800 ug via ORAL

## 2018-05-06 MED ORDER — ONDANSETRON HCL 4 MG PO TABS: 4.0000 mg | ORAL_TABLET | ORAL | Status: DC | PRN

## 2018-05-06 MED ORDER — ZOLPIDEM TARTRATE 5 MG PO TABS: 5.0000 mg | ORAL_TABLET | Freq: Every evening | ORAL | Status: DC | PRN

## 2018-05-06 MED ORDER — TETANUS-DIPHTH-ACELL PERTUSSIS 5-2.5-18.5 LF-MCG/0.5 IM SUSP: 0.5000 mL | Freq: Once | INTRAMUSCULAR | Status: DC

## 2018-05-06 MED ORDER — IBUPROFEN 600 MG PO TABS
600.0000 mg | ORAL_TABLET | Freq: Four times a day (QID) | ORAL | Status: DC
  Administered 2018-05-06 – 2018-05-07 (×6): 600 mg via ORAL
  Filled 2018-05-06 (×6): qty 1

## 2018-05-06 MED ORDER — WITCH HAZEL-GLYCERIN EX PADS: 1.0000 "application " | MEDICATED_PAD | CUTANEOUS | Status: DC | PRN

## 2018-05-06 MED ORDER — PRENATAL MULTIVITAMIN CH
1.0000 | ORAL_TABLET | Freq: Every day | ORAL | Status: DC
  Administered 2018-05-06 – 2018-05-07 (×2): 1 via ORAL
  Filled 2018-05-06 (×2): qty 1

## 2018-05-06 MED ORDER — DIPHENHYDRAMINE HCL 25 MG PO CAPS
25.0000 mg | ORAL_CAPSULE | Freq: Four times a day (QID) | ORAL | Status: DC | PRN
  Administered 2018-05-06 (×2): 25 mg via ORAL
  Filled 2018-05-06 (×2): qty 1

## 2018-05-06 MED ORDER — DIBUCAINE 1 % RE OINT: 1.0000 "application " | TOPICAL_OINTMENT | RECTAL | Status: DC | PRN

## 2018-05-06 MED ORDER — MISOPROSTOL 200 MCG PO TABS
ORAL_TABLET | ORAL | Status: AC
  Filled 2018-05-06: qty 4

## 2018-05-06 MED ORDER — BENZOCAINE-MENTHOL 20-0.5 % EX AERO: 1.0000 "application " | INHALATION_SPRAY | CUTANEOUS | Status: DC | PRN

## 2018-05-06 MED ORDER — SENNOSIDES-DOCUSATE SODIUM 8.6-50 MG PO TABS
2.0000 | ORAL_TABLET | ORAL | Status: DC
  Administered 2018-05-06: 2 via ORAL
  Filled 2018-05-06: qty 2

## 2018-05-06 MED ORDER — ACETAMINOPHEN 325 MG PO TABS
650.0000 mg | ORAL_TABLET | ORAL | Status: DC | PRN
  Administered 2018-05-06 – 2018-05-07 (×3): 650 mg via ORAL
  Filled 2018-05-06 (×3): qty 2

## 2018-05-06 NOTE — Anesthesia Postprocedure Evaluation (Signed)
Anesthesia Post Note  Patient: Brittany Bowers  Procedure(s) Performed: AN AD HOC LABOR EPIDURAL     Patient location during evaluation: Mother Baby Anesthesia Type: Epidural Level of consciousness: awake and alert Pain management: pain level controlled Vital Signs Assessment: post-procedure vital signs reviewed and stable Respiratory status: spontaneous breathing, nonlabored ventilation and respiratory function stable Cardiovascular status: stable Postop Assessment: no headache, no backache, epidural receding, no apparent nausea or vomiting, patient able to bend at knees, able to ambulate and adequate PO intake Anesthetic complications: no    Last Vitals:  Vitals:   05/06/18 0240 05/06/18 0630  BP: 130/85 125/75  Pulse: 90 87  Resp: 18 18  Temp: 37.7 C 37.1 C  SpO2: 100% 99%    Last Pain:  Vitals:   05/06/18 0803  TempSrc:   PainSc: 0-No pain   Pain Goal: Patients Stated Pain Goal: 3 (05/05/18 1930)               Laban EmperorMalinova,Corita Allinson Hristova

## 2018-05-06 NOTE — Progress Notes (Signed)
Patient does not want PPBTL done anymore, desires Paragard instead. OB OR staff notified, procedure cancelled, regular diet ordered.  Continue routine postpartum care.   Jaynie CollinsUGONNA  Briony Parveen, MD, FACOG Obstetrician & Gynecologist, [facility], [facility]

## 2018-05-06 NOTE — Progress Notes (Signed)
Faculty Practice OB/GYN Attending Note  22 y.o. [phone] s/p recent SVD today at 4696w1d who desires permanent sterilization.  Other reversible forms of contraception were discussed with patient; she declines all other modalities.  Only reason she declined IUD was for the strings; she was told they can be tucked into cervical canal as she said her partner felt them and she did not like it.  Emphasized that IUDs, Nexplanon and vasectomy are more effective than bilateral tubal sterilization. Patient was also unaware that this was abdominal surgery; she expected vaginal surgery. Details of the procedure explained in detail; told her that Filshie clips will be placed on both tubes.  Risks of procedure discussed with patient including but not limited to: risk of regret (especially given her age, unmarried status, not currently in a long-term relationship), permanence of method, bleeding, infection, injury to surrounding organs and need for additional procedures.  Failure risk of about 1-2% with increased risk of ectopic gestation if pregnancy occurs was also discussed with patient.  Patient verbalized understanding of these risks and wants to proceed with sterilization.  Written informed consent obtained.  To OR when ready.   Jaynie CollinsUGONNA  ANYANWU, MD, FACOG Obstetrician & Gynecologist, [facility], [facility]

## 2018-05-06 NOTE — Progress Notes (Signed)
Post Partum Day #1 Subjective: no complaints, up ad lib and tolerating PO; breast and bottlefeeding; planning BTL today  Objective: Blood pressure 125/75, pulse 87, temperature 98.8 F (37.1 C), temperature source Oral, resp. rate 18, height 5\' 2"  (1.575 m), weight 93 kg (205 lb), last menstrual period 08/01/2017, SpO2 99 %, unknown if currently breastfeeding.  Physical Exam:  General: alert, cooperative and no distress Lochia: appropriate Uterine Fundus: firm DVT Evaluation: No evidence of DVT seen on physical exam.  Recent Labs    05/05/18 0747 05/05/18 2159  HGB 10.5* 11.0*  HCT 32.2* 33.7*    Assessment/Plan: Plan for discharge tomorrow  BTL later this morning- OR time pending   LOS: 1 day   Cam HaiSHAW, KIMBERLY CNM 05/06/2018, 8:13 AM

## 2018-05-07 NOTE — Clinical Social Work Maternal (Signed)
CLINICAL SOCIAL WORK MATERNAL/CHILD NOTE  Patient Details  Name: Brittany Bowers MRN: [id number] Date of Birth: 09-May-1996  Date:  05/07/2018  Clinical Social Worker Initiating Note:  Madilyn Fireman, MSW, LCSW-A Date/Time: Initiated:  05/07/18/1058     Child's Name:  Brittany Bowers   Biological Parents:  Mother   Need for Interpreter:  None   Reason for Referral:  Current Substance Use/Substance Use During Pregnancy    Address:  [address] [place] [address]    Phone number:  [phone] (home)     Additional phone number:   Household Members/Support Persons (HM/SP):       HM/SP Name Relationship DOB or Age  HM/SP -1        HM/SP -2        HM/SP -3        HM/SP -4        HM/SP -5        HM/SP -6        HM/SP -7        HM/SP -8          Natural Supports (not living in the home):  Extended Family, Friends, Immediate Family   Professional Supports: None   Employment: Full-time   Type of Work: [place]   Education:  High school graduate   Homebound arranged:    Museum/gallery curator Resources:  Medicaid   Other Resources:  [employer], Physicist, medical    Cultural/Religious Considerations Which May Impact Care:  [employer]  Strengths:  Ability to meet basic needs , Home prepared for child , Pediatrician chosen   Psychotropic Medications:   None      Pediatrician:    [place]  Pediatrician List:   [place]  [place]      Pediatrician Fax Number:    Risk Factors/Current Problems:  None   Cognitive State:  Alert , Able to Concentrate    Mood/Affect:  Comfortable , Calm , Interested    CSW Assessment: CSW received consult for MOB due to history of marijuana use. CSW met with MOB and newborn Brittany Bowers at bedside to discuss case. CSW inquired with MOB about marijuana use during pregnancy, she stated it was early on and hasn't  done it since. CSW educated MOB on hospital drug screening policies, MOB stated understanding and had no concerns regarding cord results. CSW informed MOB that newborn UDS was negative. MOB reports having a new car seat for Brittany Bowers with use of installation and use. MOB reports that Brittany Bowers will go to [facility] due to her daughter being dismissed from another practice. MOB reports that she works full time at [employer] but will take time off to care for [place]. MOB reports having a great support system including her mother, grandmother, sisters, and lots of friends. MOB reports receiving WIC and Food Stamps. CSW educated MOB to reach out to DSS to notify them of recent birth to add Brittany Bowers to her cases. CSW encouraged MOB to reach out for assistance to [facility] CSW Department if needs or questions arise, MOB stated agreement. MOB's grandmother and sister arrived as CSW was finishing assessment.   CSW to continue monitoring cord for results and will make report if warranted.  CSW Plan/Description:  No Further Intervention Required/No Barriers to Discharge, Sudden Infant Death Syndrome (SIDS) Education, Perinatal  Mood and Anxiety Disorder (PMADs) Education, [place], CSW Will Continue to Monitor Umbilical Cord Tissue Drug Screen Results and Make Report if Brittany Bowers 05/07/2018, 12:47 PM

## 2018-05-07 NOTE — Discharge Summary (Signed)
OB Discharge Summary     Patient Name: Brittany Bowers DOB: 29-Jul-1996 MRN: [id number]  Date of admission: 05/05/2018 Delivering MD: Cam Hai D   Date of discharge: 05/07/2018  Admitting diagnosis: INDUCTION Intrauterine pregnancy: [redacted]w[redacted]d     Secondary diagnosis:  Principal Problem:   Status post induced vaginal delivery for FGR Active Problems:   Anxiety   Poor fetal growth affecting management of mother in third trimester  Additional problems: SVD, Intrapartum Hypertension, Manual removal of placenta     Discharge diagnosis: Term Pregnancy Delivered                                                                                                Post partum procedures:none  Augmentation: AROM, Cytotec and Foley Balloon  Complications: None  Hospital course:  Induction of Labor With Vaginal Delivery   22 y.o. yo G2P2002 at [redacted]w[redacted]d was admitted to the hospital 05/05/2018 for induction of labor.  Indication for induction: Fetal growth restriction .  Patient had an uncomplicated labor course as follows: Membrane Rupture Time/Date: 10:45 PM ,05/05/2018   Intrapartum Procedures: Episiotomy: None [1]                                         Lacerations:  None [1]  Patient had delivery of a Viable infant.  Information for the patient's newborn:  Shariyah, Eland [[id number]]  Delivery Method: Vaginal, Spontaneous(Filed from Delivery Summary)   05/05/2018  Details of delivery can be found in separate delivery note.  Patient had a routine postpartum course. Patient is discharged home 05/07/18.  Physical exam  Vitals:   05/06/18 1030 05/06/18 1539 05/06/18 1927 05/07/18 0542  BP: 127/77 121/73 122/79 122/80  Pulse: 71 73 82 85  Resp:   18 18  Temp: 98.2 F (36.8 C) 98.1 F (36.7 C) 98.3 F (36.8 C) 98.1 F (36.7 C)  TempSrc: Oral Oral Oral Oral  SpO2:   100%   Weight:      Height:       General: alert, cooperative and no distress Lochia: appropriate Uterine Fundus:  firm Incision: N/A DVT Evaluation: No evidence of DVT seen on physical exam. No cords or calf tenderness. No significant calf/ankle edema. Labs: Lab Results  Component Value Date   WBC 12.4 (H) 05/05/2018   HGB 11.0 (L) 05/05/2018   HCT 33.7 (L) 05/05/2018   MCV 84.3 05/05/2018   PLT 238 05/05/2018   CMP Latest Ref Rng & Units 05/05/2018  Glucose 65 - 99 mg/dL 782(N)  BUN 6 - 20 mg/dL 6  Creatinine 5.62 - 1.30 mg/dL 8.65  Sodium 784 - 696 mmol/L 135  Potassium 3.5 - 5.1 mmol/L 3.5  Chloride 101 - 111 mmol/L 103  CO2 22 - 32 mmol/L 22  Calcium 8.9 - 10.3 mg/dL 9.0  Total Protein 6.5 - 8.1 g/dL 7.2  Total Bilirubin 0.3 - 1.2 mg/dL 0.4  Alkaline Phos 38 - 126 U/L 145(H)  AST 15 - 41 U/L 21  ALT 14 - 54 U/L 16    Discharge instruction: per After Visit Summary and "Baby and Me Booklet".  After visit meds:  Allergies as of 05/07/2018      Reactions   Bee Venom Swelling   Onion Other (See Comments)   Upset stomach   Pork-derived Products Other (See Comments)   Religious preference      Medication List    TAKE these medications   beclomethasone 40 MCG/ACT inhaler Commonly known as:  QVAR Inhale 2 puffs into the lungs 2 (two) times daily as needed (asthma).   COMFORT FIT MATERNITY SUPP LG Misc Needs one maternity support belt   CONCEPT OB 130-92.4-1 MG Caps Take 1 tablet by mouth daily.   diphenhydrAMINE 25 MG tablet Commonly known as:  BENADRYL Take 1 tablet (25 mg total) by mouth every 8 (eight) hours as needed.   ranitidine 150 MG tablet Commonly known as:  ZANTAC Take 1 tablet (150 mg total) by mouth 2 (two) times daily.       Diet: routine diet  Activity: Advance as tolerated. Pelvic rest for 6 weeks.   Outpatient follow up:1 week for BP check Follow up Appt:No future appointments. Follow up Visit:No follow-ups on file.  Postpartum contraception: IUD Paragard  Newborn Data: Live born female  Birth Weight: 6 lb 9.3 oz (2985 g) APGAR: 9,  9  Newborn Delivery   Birth date/time:  05/05/2018 23:45:00 Delivery type:  Vaginal, Spontaneous     Baby Feeding: Breast Disposition:home with mother   05/07/2018 Sharyon CableVeronica C Demitrios Molyneux, CNM

## 2018-05-07 NOTE — Lactation Note (Signed)
This note was copied from a baby's chart. Lactation Consultation Note  Patient Name: Brittany Bowers ZOXWR'UToday's Date: 05/07/2018 Reason for consult: Follow-up assessment;Term  Moms feeding preference formula - mom aware how to dry  Milk up with cabbage leaves.    Maternal Data    Feeding Feeding Type: Bottle Fed - Formula Nipple Type: Slow - flow  LATCH Score                   Interventions    Lactation Tools Discussed/Used     Consult Status Consult Status: Complete    Matilde SprangMargaret Ann Winthrop Shannahan 05/07/2018, 9:34 AM

## 2018-05-09 ENCOUNTER — Telehealth: Payer: Self-pay | Admitting: General Practice

## 2018-05-09 NOTE — Telephone Encounter (Signed)
Called and notified patient of RN visit appt on 05/17/18 at 2:30pm for BP check.  Patient voiced understanding.

## 2018-05-10 ENCOUNTER — Encounter (HOSPITAL_COMMUNITY): Payer: Self-pay

## 2018-05-11 ENCOUNTER — Encounter: Payer: Self-pay | Admitting: Student

## 2018-05-12 ENCOUNTER — Other Ambulatory Visit: Payer: Self-pay | Admitting: Obstetrics & Gynecology

## 2018-05-12 MED ORDER — ENALAPRIL MALEATE 2.5 MG PO TABS: 5.0000 mg | ORAL_TABLET | Freq: Every day | ORAL | Status: DC

## 2018-05-12 NOTE — Progress Notes (Signed)
Family connects nurse called to report elevated BP  130s/100s.  No HA, scotomata, RUQ pain.    Pt had intrapartum HTN.  No signs of severe pre eclampsia.  Will prescribe enalapril and have patient follow up next week for BP check.  Pre E symptoms reviewed and pt should call or come to [facility] with concerns.

## 2018-05-17 ENCOUNTER — Ambulatory Visit (INDEPENDENT_AMBULATORY_CARE_PROVIDER_SITE_OTHER): Payer: Medicaid Other | Admitting: General Practice

## 2018-05-17 ENCOUNTER — Encounter: Payer: Self-pay | Admitting: General Practice

## 2018-05-17 VITALS — BP 128/93 | HR 83 | Ht 62.0 in | Wt 191.0 lb

## 2018-05-17 DIAGNOSIS — Z0130 Encounter for examination of blood pressure without abnormal findings: Secondary | ICD-10-CM

## 2018-05-17 NOTE — Progress Notes (Signed)
Patient presents to office today for BP check. Patient delivered vaginally 1.5 weeks ago. Patient reports taking enalapril daily- denies headaches, dizziness or blurry vision. No edema noted.   Reviewed patient's BP with Sharen CounterLisa Leftwich- Kirby, who is fine with patient's BP today, patient should follow up at scheduled pp visit.  Discussed with patient. Patient verbalized understanding & requests return to work letter. Letter given. Patient had no questions.

## 2018-06-12 ENCOUNTER — Encounter: Payer: Self-pay | Admitting: Student

## 2018-06-12 ENCOUNTER — Ambulatory Visit: Payer: Self-pay | Admitting: Student

## 2018-08-12 IMAGING — US US OB TRANSVAGINAL
1 series · 15 of 28 positions shown · non-contrast
Comparison: None.

CLINICAL DATA: Left lower quadrant pain. First trimester pregnancy
with inconclusive fetal viability.

EXAM:
TRANSVAGINAL OB ULTRASOUND
TECHNIQUE: Transvaginal ultrasound was performed for complete evaluation of the
gestation as well as the maternal uterus, adnexal regions, and
pelvic cul-de-sac.

[Series 1: us ob transvaginal · 15 of 65 slices shown]
[im 1/65]
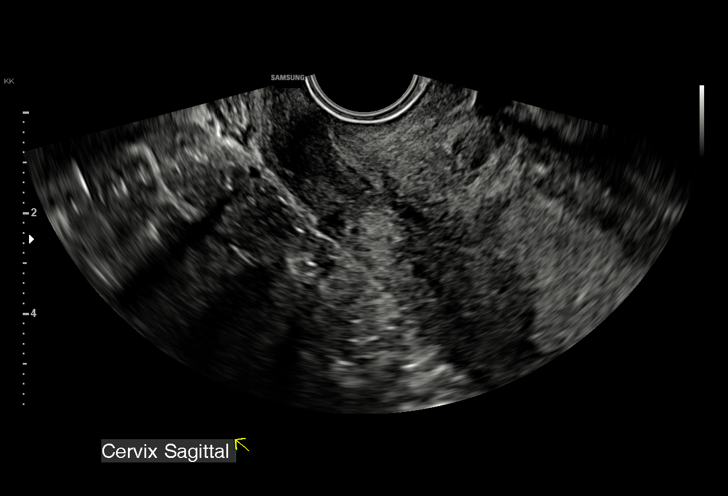
[im 5/65]
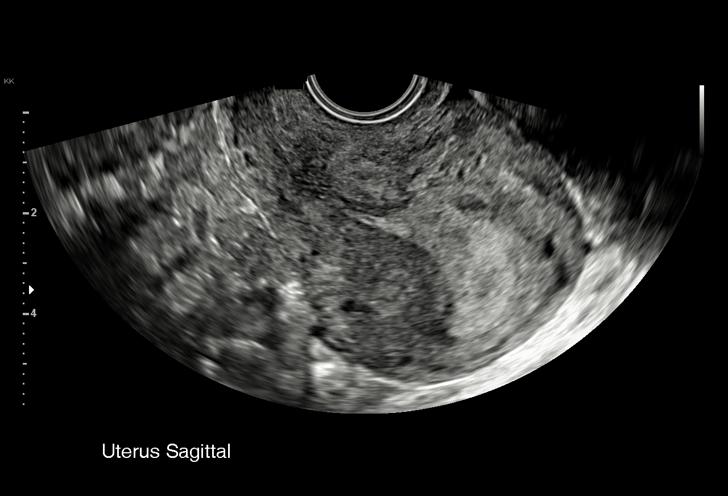
[im 10/65]
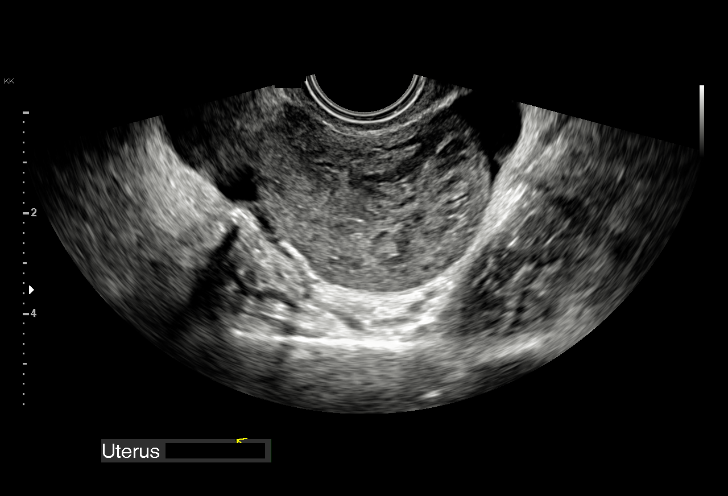
[im 15/65]
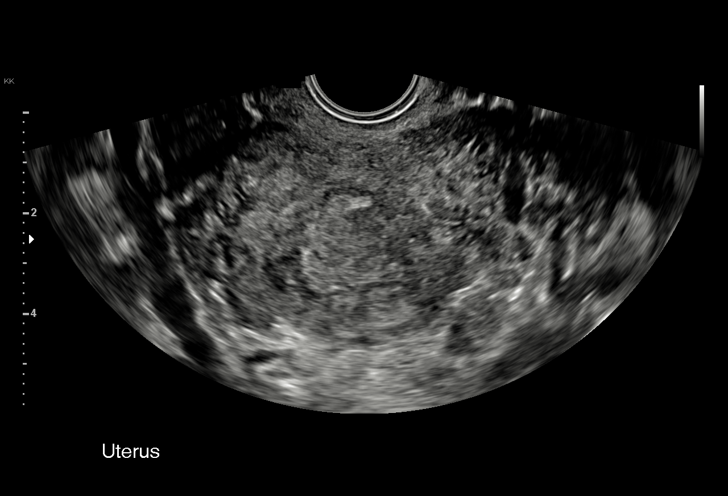
[im 19/65]
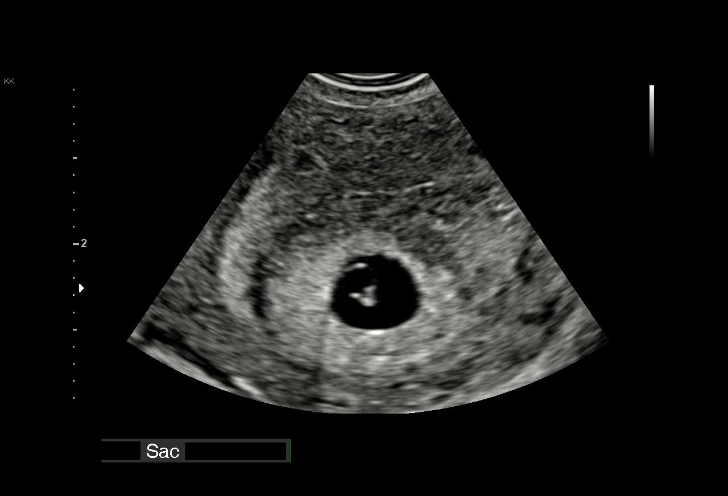
[im 24/65]
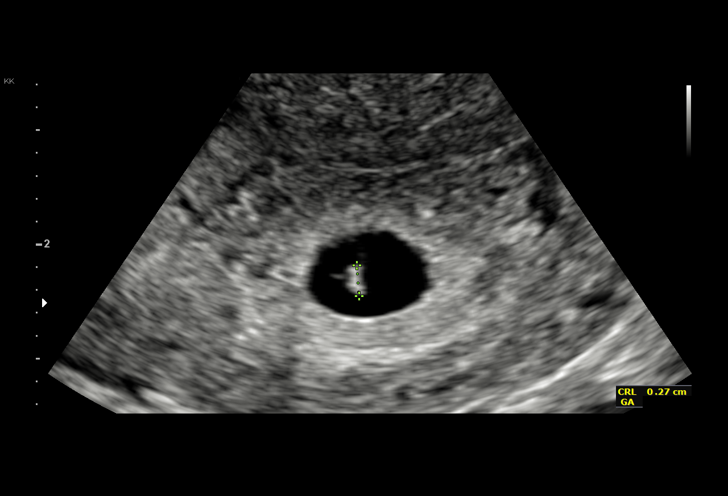
[im 29/65]
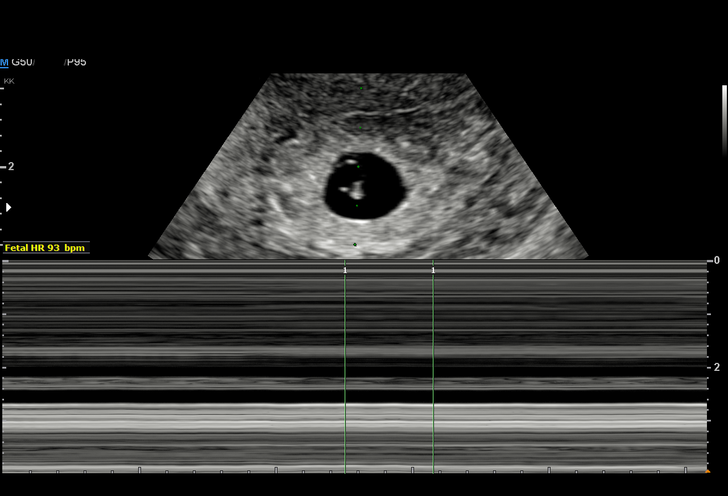
[im 34/65]
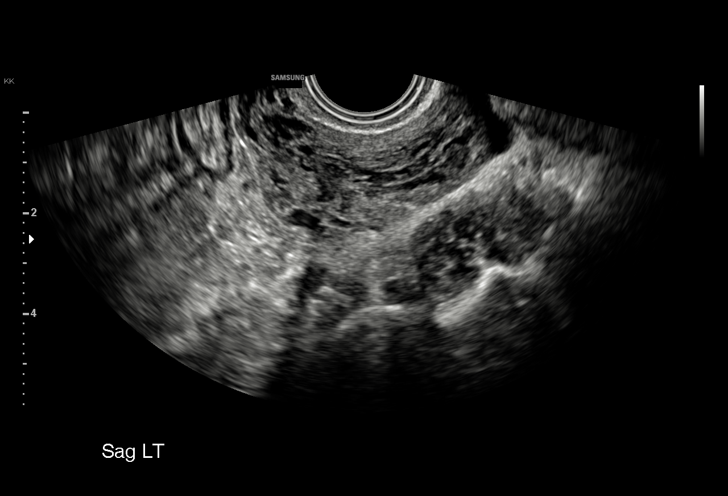
[im 36/65]
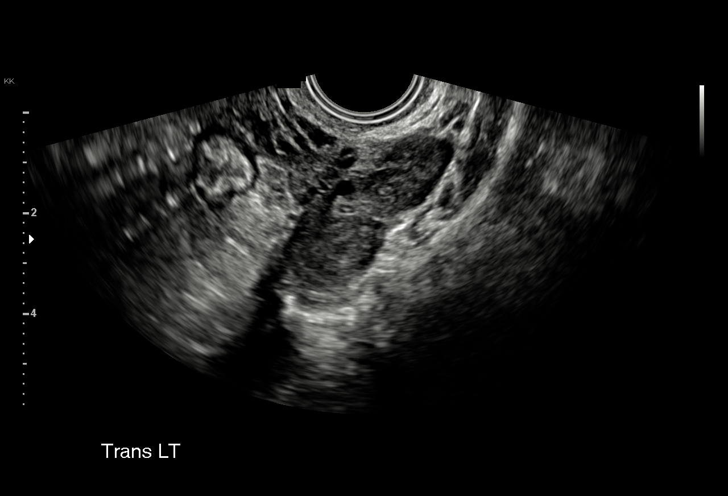
[im 41/65]
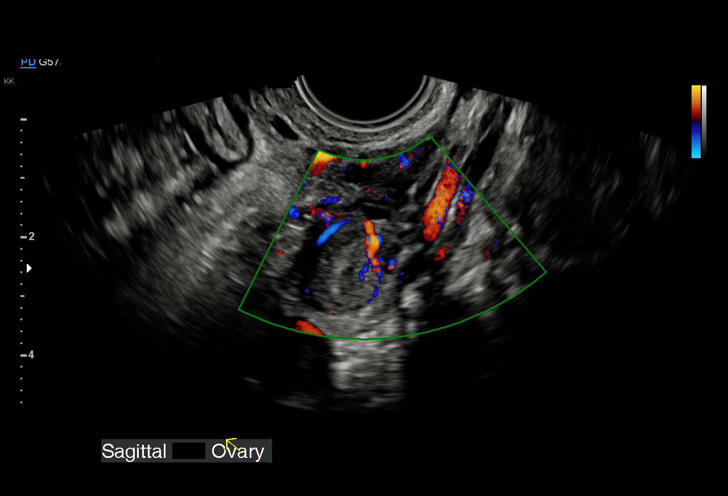
[im 46/65]
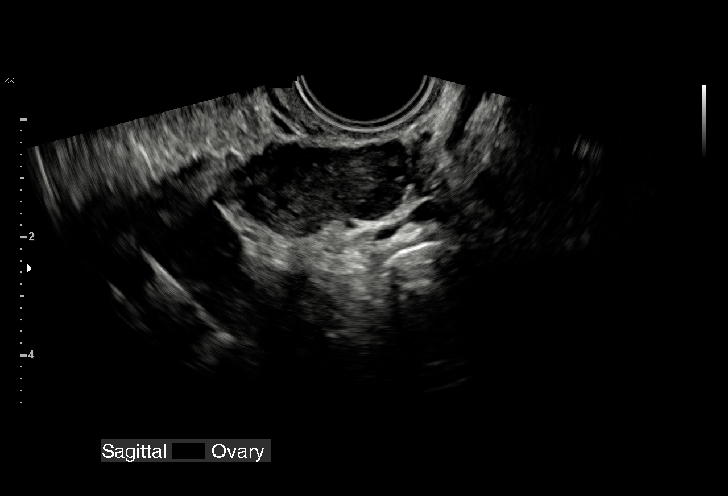
[im 50/65]
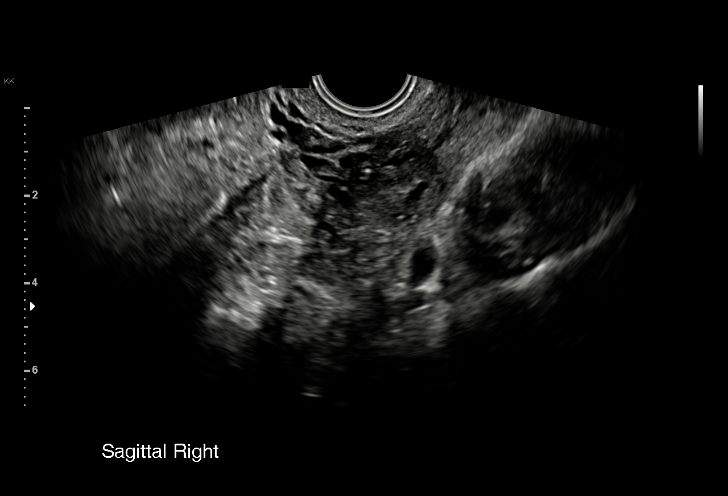
[im 55/65]
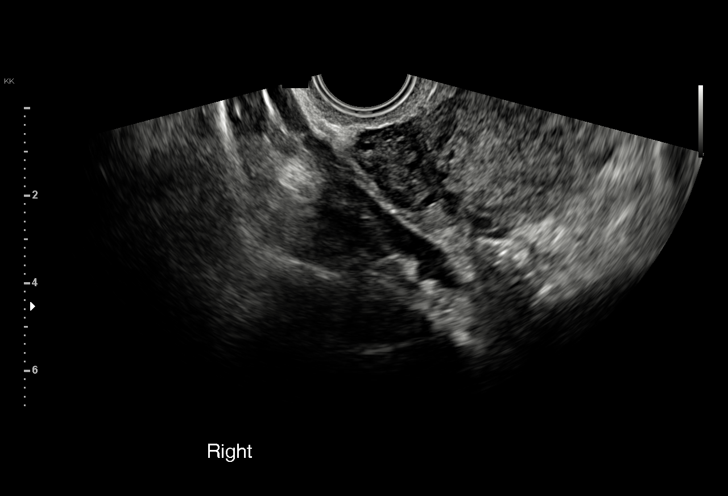
[im 60/65]
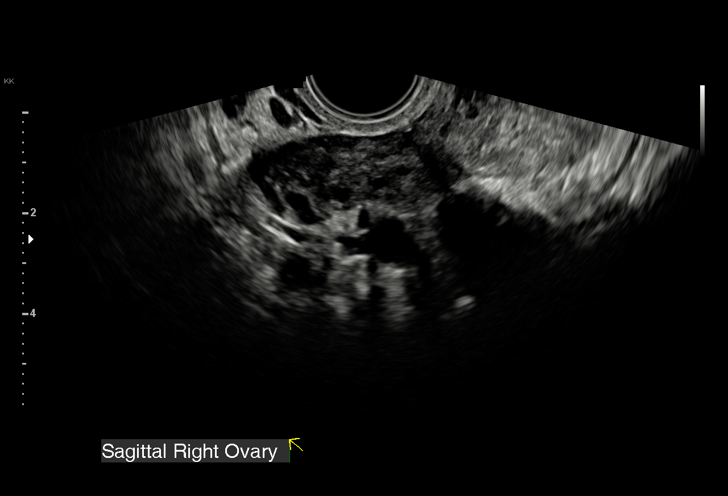
[im 65/65]
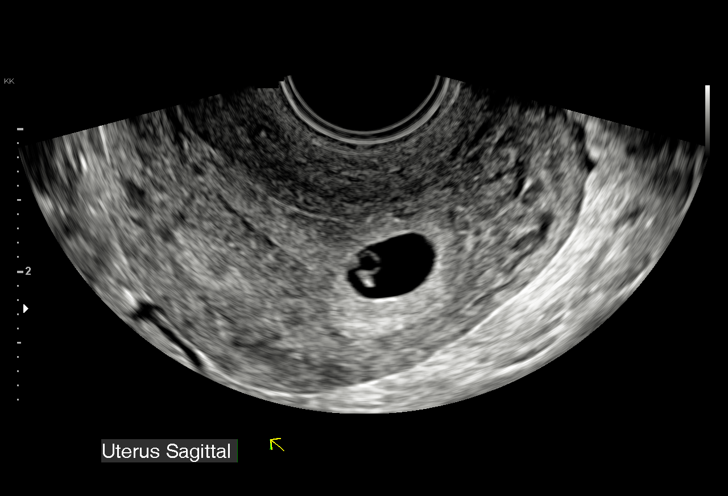

[15 of 28 positions shown; findings below may reference images not displayed]

FINDINGS: Intrauterine gestational sac: Single

Yolk sac:  Visualized.

Embryo:  Visualized.

Cardiac Activity: Visualized.

Heart Rate: 93 bpm

CRL:   3  mm   5 w 5 d                  US EDC: 05/11/2018

Subchorionic hemorrhage:  Tiny subchorionic hemorrhage noted.

Maternal uterus/adnexae: Retroverted uterus. Small left ovarian
corpus luteum. No mass or abnormal free fluid identified.
IMPRESSION: Single living IUP measuring 5 weeks 5 days, with US EDC of
05/11/2018.

Tiny subchorionic hemorrhage.

## 2018-08-28 ENCOUNTER — Encounter: Payer: Self-pay | Admitting: Advanced Practice Midwife

## 2018-08-28 ENCOUNTER — Other Ambulatory Visit (HOSPITAL_COMMUNITY)
Admission: RE | Admit: 2018-08-28 | Discharge: 2018-08-28 | Disposition: A | Discharge status: Home / Self Care | Payer: Medicaid Other | Source: Ambulatory Visit | Attending: Advanced Practice Midwife | Admitting: Advanced Practice Midwife

## 2018-08-28 ENCOUNTER — Ambulatory Visit (INDEPENDENT_AMBULATORY_CARE_PROVIDER_SITE_OTHER): Payer: Medicaid Other | Admitting: Advanced Practice Midwife

## 2018-08-28 VITALS — BP 130/80 | HR 87 | Ht 62.0 in | Wt 190.5 lb

## 2018-08-28 DIAGNOSIS — Z01419 Encounter for gynecological examination (general) (routine) without abnormal findings: Secondary | ICD-10-CM | POA: Diagnosis present

## 2018-08-28 DIAGNOSIS — Z30430 Encounter for insertion of intrauterine contraceptive device: Secondary | ICD-10-CM | POA: Diagnosis not present

## 2018-08-28 DIAGNOSIS — Z0000 Encounter for general adult medical examination without abnormal findings: Secondary | ICD-10-CM | POA: Diagnosis not present

## 2018-08-28 DIAGNOSIS — Z3009 Encounter for other general counseling and advice on contraception: Secondary | ICD-10-CM

## 2018-08-28 LAB — POCT PREGNANCY, URINE: PREG TEST UR: NEGATIVE

## 2018-08-28 MED ORDER — LEVONORGESTREL 19.5 MCG/DAY IU IUD
INTRAUTERINE_SYSTEM | Freq: Once | INTRAUTERINE | Status: AC
  Administered 2018-08-28: 17:00:00 via INTRAUTERINE

## 2018-08-28 NOTE — Patient Instructions (Signed)

## 2018-08-28 NOTE — Addendum Note (Signed)
Addended by: Henrietta Dine on: 08/28/2018 04:48 PM   Modules accepted: Orders

## 2018-08-28 NOTE — Progress Notes (Signed)
GYNECOLOGY ANNUAL PREVENTATIVE CARE ENCOUNTER NOTE  Subjective:   Brittany Bowers is a 22 y.o. [phone] female here for a routine annual gynecologic exam.  Current complaints: none.   Denies abnormal vaginal bleeding, discharge, pelvic pain, problems with intercourse or other gynecologic concerns.    Gynecologic History LMP: middle of September. Patient denies any intercourse since last period. She states that she has not had intercourse in 7 months  Contraception: abstinence Last Pap: 10/2017. Results were: normal Last mammogram: NA. Results were: NA  Obstetric History OB History  Gravida Para Term Preterm AB Living  2 2 2    0 2  SAB TAB Ectopic Multiple Live Births  0     0 2    # Outcome Date GA Lbr Len/2nd Weight Sex Delivery Anes PTL Lv  2 Term 05/05/18 [redacted]w[redacted]d / 00:10 6 lb 9.3 oz (2.985 kg) M Vag-Spont EPI  LIV     Birth Comments: WNL  1 Term 05/29/15 [redacted]w[redacted]d 23:54 / 00:18 5 lb 14.5 oz (2.679 kg) F Vag-Spont EPI  LIV    Past Medical History:  Diagnosis Date  . Asthma    uses inhaler twice/day    Past Surgical History:  Procedure Laterality Date  . TOE SURGERY Bilateral     Current Outpatient Medications on File Prior to Visit  Medication Sig Dispense Refill  . beclomethasone (QVAR) 40 MCG/ACT inhaler Inhale 2 puffs into the lungs 2 (two) times daily as needed (asthma).    . diphenhydrAMINE (BENADRYL) 25 MG tablet Take 1 tablet (25 mg total) by mouth every 8 (eight) hours as needed. 20 tablet 0  . ranitidine (ZANTAC) 150 MG tablet Take 1 tablet (150 mg total) by mouth 2 (two) times daily. 60 tablet 0  . Elastic Bandages & Supports (COMFORT FIT MATERNITY SUPP LG) MISC Needs one maternity support belt (Patient not taking: Reported on 04/27/2018) 1 each 0   Current Facility-Administered Medications on File Prior to Visit  Medication Dose Route Frequency Provider Last Rate Last Dose  . enalapril (VASOTEC) tablet 5 mg  5 mg Oral Daily Lesly Dukes, MD        Allergies   Allergen Reactions  . Bee Venom Swelling  . Onion Other (See Comments)    Upset stomach  . Pork-Derived Products Other (See Comments)    Religious preference    Social History   Socioeconomic History  . Marital status: Single    Spouse name: Not on file  . Number of children: Not on file  . Years of education: Not on file  . Highest education level: Not on file  Occupational History  . Not on file  Social Needs  . Financial resource strain: Not on file  . Food insecurity:    Worry: Not on file    Inability: Not on file  . Transportation needs:    Medical: Not on file    Non-medical: Not on file  Tobacco Use  . Smoking status: Former Smoker    Packs/day: 0.50    Last attempt to quit: 09/17/2017    Years since quitting: 0.9  . Smokeless tobacco: Never Used  Substance and Sexual Activity  . Alcohol use: No    Frequency: Never    Comment: occational  . Drug use: No    Types: Marijuana    Comment: last use 3 feb 19  . Sexual activity: Yes    Birth control/protection: None  Lifestyle  . Physical activity:    Days per week:  Not on file    Minutes per session: Not on file  . Stress: Not on file  Relationships  . Social connections:    Talks on phone: Not on file    Gets together: Not on file    Attends religious service: Not on file    Active member of club or organization: Not on file    Attends meetings of clubs or organizations: Not on file    Relationship status: Not on file  . Intimate partner violence:    Fear of current or ex partner: Not on file    Emotionally abused: Not on file    Physically abused: Not on file    Forced sexual activity: Not on file  Other Topics Concern  . Not on file  Social History Narrative  . Not on file    Family History  Problem Relation Age of Onset  . Diabetes Father   . Diabetes Maternal Grandmother   . Stroke Maternal Grandmother        TIA  . Cancer Maternal Grandfather   . Cancer Maternal Aunt   . Stroke  Maternal Aunt   . Alcohol abuse Neg Hx   . Arthritis Neg Hx   . Asthma Neg Hx   . Birth defects Neg Hx   . COPD Neg Hx   . Depression Neg Hx   . Drug abuse Neg Hx   . Early death Neg Hx   . Hearing loss Neg Hx   . Heart disease Neg Hx   . Hyperlipidemia Neg Hx   . Hypertension Neg Hx   . Kidney disease Neg Hx   . Learning disabilities Neg Hx   . Mental illness Neg Hx   . Mental retardation Neg Hx   . Miscarriages / Stillbirths Neg Hx   . Vision loss Neg Hx   . Varicose Veins Neg Hx     The following portions of the patient's history were reviewed and updated as appropriate: allergies, current medications, past family history, past medical history, past social history, past surgical history and problem list.  Review of Systems Pertinent items noted in HPI and remainder of comprehensive ROS otherwise negative.   Objective:  BP 130/80   Pulse 87   Ht 5\' 2"  (1.575 m)   Wt 190 lb 8 oz (86.4 kg)   Breastfeeding? No   BMI 34.84 kg/m  CONSTITUTIONAL: Well-developed, well-nourished female in no acute distress.  HENT:  Normocephalic, atraumatic, External right and left ear normal. Oropharynx is clear and moist EYES: Conjunctivae and EOM are normal. Pupils are equal, round, and reactive to light. No scleral icterus.  NECK: Normal range of motion, supple, no masses.  Normal thyroid.  SKIN: Skin is warm and dry. No rash noted. Not diaphoretic. No erythema. No pallor. NEUROLOGIC: Alert and oriented to person, place, and time. Normal reflexes, muscle tone coordination. No cranial nerve deficit noted. PSYCHIATRIC: Normal mood and affect. Normal behavior. Normal judgment and thought content. CARDIOVASCULAR: Normal heart rate noted, regular rhythm RESPIRATORY: Clear to auscultation bilaterally. Effort and breath sounds normal, no problems with respiration noted. ABDOMEN: Soft, normal bowel sounds, no distention noted.  No tenderness, rebound or guarding.  PELVIC: Normal appearing external  genitalia; normal appearing vaginal mucosa and cervix.  No abnormal discharge noted.  Normal uterine size, no other palpable masses, no uterine or adnexal tenderness. MUSCULOSKELETAL: Normal range of motion. No tenderness.  No cyanosis, clubbing, or edema.  2+ distal pulses.  Results for orders placed or  performed in visit on 08/28/18 (from the past 24 hour(s))  Pregnancy, urine POC     Status: None   Collection Time: 08/28/18  4:22 PM  Result Value Ref Range   Preg Test, Ur NEGATIVE NEGATIVE    GYNECOLOGY OFFICE PROCEDURE NOTE  Brittany Bowers is a 22 y.o. [id number] here for [place] IUD insertion. No GYN concerns.  Last pap smear was on 10/2017 and was normal.  IUD Insertion Procedure Note Patient identified, informed consent performed, consent signed.   Discussed risks of irregular bleeding, cramping, infection, malpositioning or misplacement of the IUD outside the uterus which may require further procedure such as laparoscopy. Time out was performed.  Urine pregnancy test negative.  Speculum placed in the vagina.  Cervix visualized.  Cleaned with Betadine x 2.  Grasped anteriorly with a single tooth tenaculum.  Uterus sounded to 8 cm.  Liletta IUD placed per manufacturer's recommendations.  Strings trimmed to 3 cm. Tenaculum was removed, good hemostasis noted.  Patient tolerated procedure well.   Patient was given post-procedure instructions.  She was advised to have backup contraception for one week.  Patient was also asked to check IUD strings periodically and follow up in 4 weeks for IUD check.   Assessment and Plan:  1. Well woman exam with routine gynecological exam - Pap not due at this time  - GC/CT collected   2. Encounter for IUD insertion   3. Birth control counseling   Will follow up results of pap smear and manage accordingly. Mammogram scheduled Routine preventative health maintenance measures emphasized. Please refer to After Visit Summary for other counseling  recommendations.

## 2018-08-29 ENCOUNTER — Encounter: Payer: Self-pay | Admitting: *Deleted

## 2018-08-30 LAB — CERVICOVAGINAL ANCILLARY ONLY
Chlamydia: NEGATIVE
NEISSERIA GONORRHEA: NEGATIVE

## 2018-09-27 ENCOUNTER — Ambulatory Visit: Payer: Self-pay | Admitting: Advanced Practice Midwife

## 2018-09-28 ENCOUNTER — Emergency Department (HOSPITAL_COMMUNITY)
Admission: EM | Admit: 2018-09-28 | Discharge: 2018-09-28 | Disposition: A | Discharge status: Home / Self Care | Payer: Medicaid Other | Attending: Emergency Medicine | Admitting: Emergency Medicine

## 2018-09-28 ENCOUNTER — Encounter (HOSPITAL_COMMUNITY): Payer: Self-pay

## 2018-09-28 DIAGNOSIS — Z87891 Personal history of nicotine dependence: Secondary | ICD-10-CM | POA: Insufficient documentation

## 2018-09-28 DIAGNOSIS — L739 Follicular disorder, unspecified: Secondary | ICD-10-CM

## 2018-09-28 DIAGNOSIS — J45909 Unspecified asthma, uncomplicated: Secondary | ICD-10-CM | POA: Insufficient documentation

## 2018-09-28 DIAGNOSIS — Z79899 Other long term (current) drug therapy: Secondary | ICD-10-CM | POA: Insufficient documentation

## 2018-09-28 DIAGNOSIS — L0291 Cutaneous abscess, unspecified: Secondary | ICD-10-CM

## 2018-09-28 DIAGNOSIS — N764 Abscess of vulva: Secondary | ICD-10-CM | POA: Insufficient documentation

## 2018-09-28 MED ORDER — CEPHALEXIN 500 MG PO CAPS: 500.0000 mg | ORAL_CAPSULE | Freq: Four times a day (QID) | ORAL | 0 refills | Status: DC

## 2018-09-28 MED ORDER — CEPHALEXIN 250 MG PO CAPS
500.0000 mg | ORAL_CAPSULE | Freq: Once | ORAL | Status: AC
  Administered 2018-09-28: 500 mg via ORAL
  Filled 2018-09-28: qty 2

## 2018-09-28 MED ORDER — LIDOCAINE HCL (PF) 1 % IJ SOLN
5.0000 mL | Freq: Once | INTRAMUSCULAR | Status: AC
  Administered 2018-09-28: 5 mL via INTRADERMAL
  Filled 2018-09-28: qty 5

## 2018-09-28 MED ORDER — IBUPROFEN 800 MG PO TABS
800.0000 mg | ORAL_TABLET | Freq: Once | ORAL | Status: AC
  Administered 2018-09-28: 800 mg via ORAL
  Filled 2018-09-28: qty 1

## 2018-09-28 NOTE — ED Provider Notes (Signed)
TIME SEEN: 12:19 AM  CHIEF COMPLAINT: Abscess  HPI: Patient is a 22 year old female with history of asthma who presents to the emergency department with an abscess to the right labia majora that started yesterday.  States she thinks this is from using "cheap razors" to this area.  No fevers, vomiting.  She is not diabetic.  No abnormal vaginal bleeding or discharge.  ROS: See HPI Constitutional: no fever  Eyes: no drainage  ENT: no runny nose   Cardiovascular:  no chest pain  Resp: no SOB  GI: no vomiting GU: no dysuria Integumentary: no rash  Allergy: no hives  Musculoskeletal: no leg swelling  Neurological: no slurred speech ROS otherwise negative  PAST MEDICAL HISTORY/PAST SURGICAL HISTORY:  Past Medical History:  Diagnosis Date  . Asthma    uses inhaler twice/day    MEDICATIONS:  Prior to Admission medications   Medication Sig Start Date End Date Taking? Authorizing Provider  beclomethasone (QVAR) 40 MCG/ACT inhaler Inhale 2 puffs into the lungs 2 (two) times daily as needed (asthma).    [provider]  diphenhydrAMINE (BENADRYL) 25 MG tablet Take 1 tablet (25 mg total) by mouth every 8 (eight) hours as needed. 03/15/17   Janne NapoleonNeese, Hope M, NP  Elastic Bandages & Supports (COMFORT FIT MATERNITY SUPP LG) MISC Needs one maternity support belt Patient not taking: Reported on 04/27/2018 04/20/18   Currie ParisBurleson, Terri L, NP  ranitidine (ZANTAC) 150 MG tablet Take 1 tablet (150 mg total) by mouth 2 (two) times daily. 01/02/18   Marylene LandKooistra, Kathryn Lorraine, CNM    ALLERGIES:  Allergies  Allergen Reactions  . Bee Venom Swelling  . Onion Other (See Comments)    Upset stomach  . Pork-Derived Products Other (See Comments)    Religious preference    SOCIAL HISTORY:  Social History   Tobacco Use  . Smoking status: Former Smoker    Packs/day: 0.50    Last attempt to quit: 09/17/2017    Years since quitting: 1.0  . Smokeless tobacco: Never Used  Substance Use Topics  . Alcohol  use: No    Frequency: Never    Comment: occational    FAMILY HISTORY: Family History  Problem Relation Age of Onset  . Diabetes Father   . Diabetes Maternal Grandmother   . Stroke Maternal Grandmother        TIA  . Cancer Maternal Grandfather   . Cancer Maternal Aunt   . Stroke Maternal Aunt   . Alcohol abuse Neg Hx   . Arthritis Neg Hx   . Asthma Neg Hx   . Birth defects Neg Hx   . COPD Neg Hx   . Depression Neg Hx   . Drug abuse Neg Hx   . Early death Neg Hx   . Hearing loss Neg Hx   . Heart disease Neg Hx   . Hyperlipidemia Neg Hx   . Hypertension Neg Hx   . Kidney disease Neg Hx   . Learning disabilities Neg Hx   . Mental illness Neg Hx   . Mental retardation Neg Hx   . Miscarriages / Stillbirths Neg Hx   . Vision loss Neg Hx   . Varicose Veins Neg Hx     EXAM: BP (!) 145/97 (BP Location: Right Arm)   Pulse (!) 101   Temp 98.7 F (37.1 C) (Oral)   Resp 18   Ht 5\' 2"  (1.575 m)   Wt 79.4 kg   SpO2 99%   BMI 32.01 kg/m  CONSTITUTIONAL: Alert and oriented and responds appropriately to questions. Well-appearing; well-nourished HEAD: Normocephalic EYES: Conjunctivae clear, pupils appear equal, EOMI ENT: normal nose; moist mucous membranes NECK: Supple, no meningismus, no nuchal rigidity, no LAD  CARD: RRR; S1 and S2 appreciated; no murmurs, no clicks, no rubs, no gallops RESP: Normal chest excursion without splinting or tachypnea; breath sounds clear and equal bilaterally; no wheezes, no rhonchi, no rales, no hypoxia or respiratory distress, speaking full sentences ABD/GI: Normal bowel sounds; non-distended; soft, non-tender, no rebound, no guarding, no peritoneal signs, no hepatosplenomegaly GU: Multiple small abscesses noted to the right labia majora consistent with folliculitis with one larger area of fluctuance and erythema.  No active drainage. BACK:  The back appears normal and is non-tender to palpation, there is no CVA tenderness EXT: Normal ROM in all  joints; non-tender to palpation; no edema; normal capillary refill; no cyanosis, no calf tenderness or swelling    SKIN: Normal color for age and race; warm; no rash NEURO: Moves all extremities equally PSYCH: The patient's mood and manner are appropriate. Grooming and personal hygiene are appropriate.  MEDICAL DECISION MAKING: Patient here with folliculitis, labial abscess.  Not consistent with Bartholin cyst.  No systemic symptoms.  Otherwise well-appearing.  Will perform incision and drainage and discharge on Keflex.  Please see PA's note for I&D.   At this time, I do not feel there is any life-threatening condition present. I have reviewed and discussed all results (EKG, imaging, lab, urine as appropriate) and exam findings with patient/family. I have reviewed nursing notes and appropriate previous records.  I feel the patient is safe to be discharged home without further emergent workup and can continue workup as an outpatient as needed. Discussed usual and customary return precautions. Patient/family verbalize understanding and are comfortable with this plan.  Outpatient follow-up has been provided if needed. All questions have been answered.      Zhanae Proffit, Layla Maw, DO 09/28/18 [phone]

## 2018-09-28 NOTE — ED Triage Notes (Signed)
Pt states that since yesterday she has had three abscess to the R labia area from shaving.

## 2018-09-28 NOTE — Discharge Instructions (Signed)
You may alternate Tylenol 1000 mg every 6 hours as needed for pain and Ibuprofen 800 mg every 8 hours as needed for pain.  Please take Ibuprofen with food. ° °

## 2018-09-28 NOTE — ED Provider Notes (Signed)
..  Incision and Drainage Date/Time: 09/28/2018 3:32 AM Performed by: Ward, Chase PicketJaime Pilcher, PA-C Authorized by: Ward, Chase PicketJaime Pilcher, PA-C   Consent:    Consent obtained:  Verbal   Consent given by:  Patient   Risks discussed:  Bleeding, incomplete drainage, pain and infection Location:    Type:  Abscess   Location:  Anogenital   Anogenital location:  Vulva Pre-procedure details:    Skin preparation:  Chloraprep Anesthesia (see MAR for exact dosages):    Anesthesia method:  Local infiltration   Local anesthetic:  Lidocaine 1% w/o epi Procedure type:    Complexity:  Simple Procedure details:    Incision types:  Single straight   Scalpel blade:  11   Wound management:  Probed and deloculated   Drainage:  Purulent and bloody   Drainage amount:  Moderate   Packing materials:  None Post-procedure details:    Patient tolerance of procedure:  Tolerated well, no immediate complications      Ward, Chase PicketJaime Pilcher, PA-C 09/28/18 [id number]

## 2018-11-10 IMAGING — US US MFM OB COMP +14 WKS
1 series · 14 of 28 positions shown · non-contrast
Comparison: none

[Series 1: us mfm ob comp +14 wks · 14 of 82 slices shown]
[im 4/82]
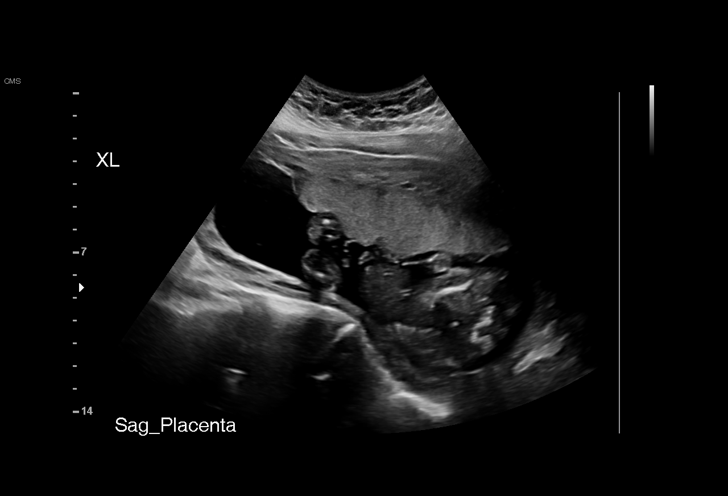
[im 10/82]
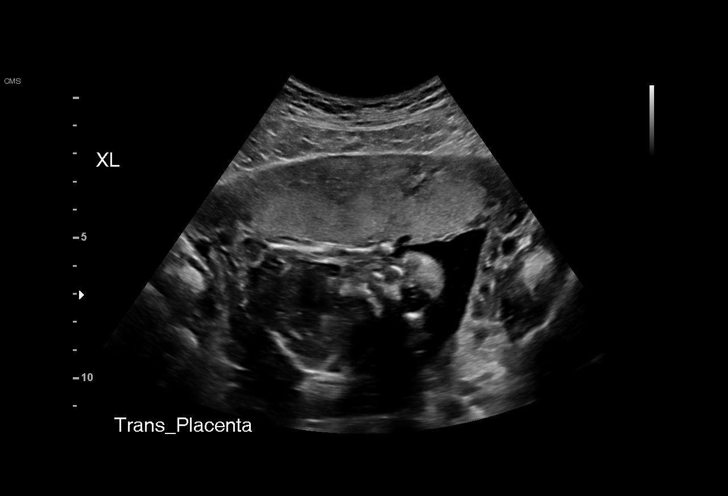
[im 16/82]
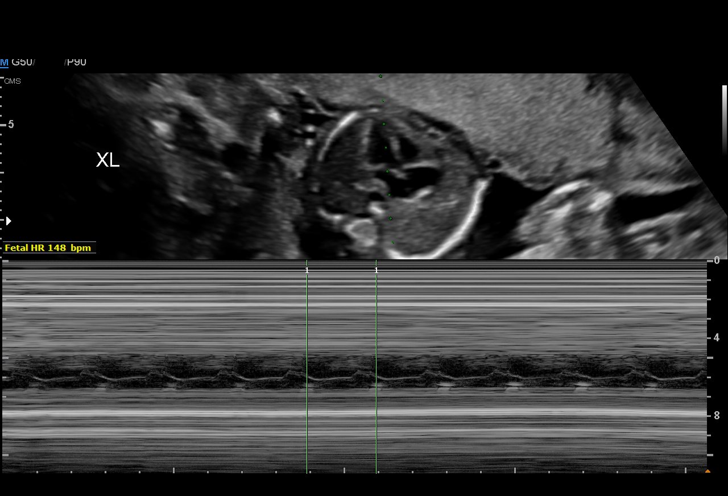
[im 22/82]
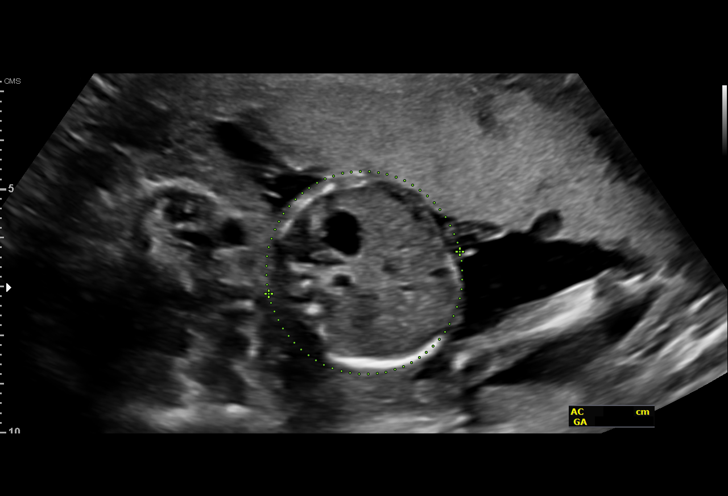
[im 28/82]
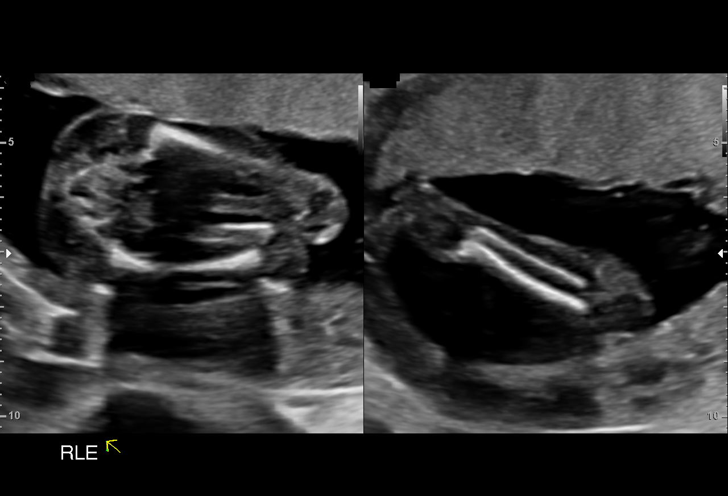
[im 34/82]
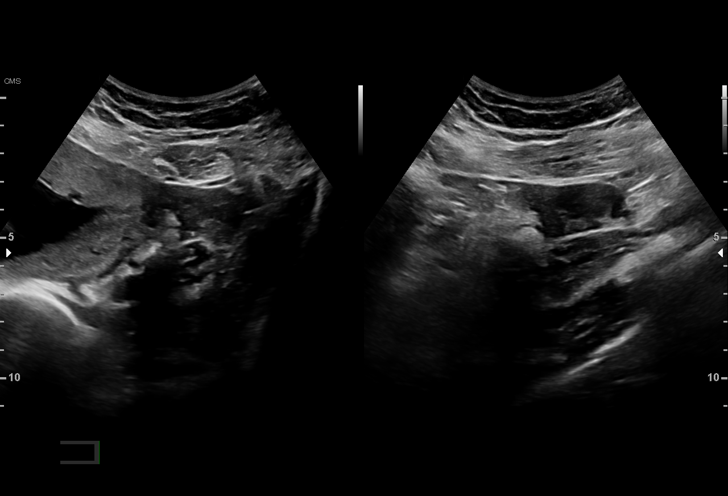
[im 40/82]
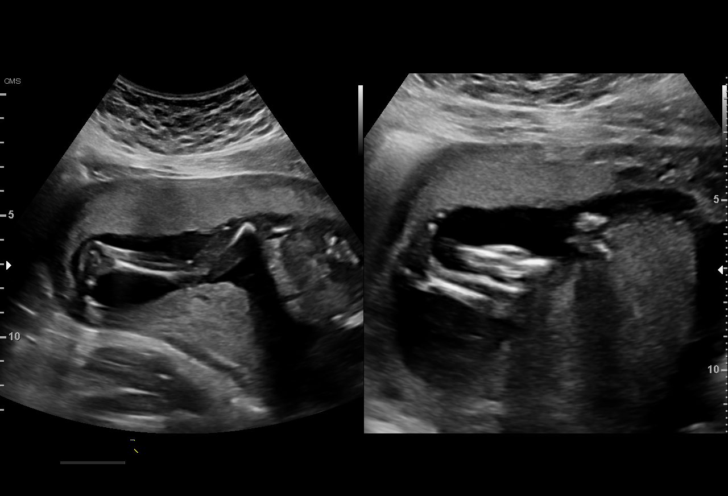
[im 46/82]
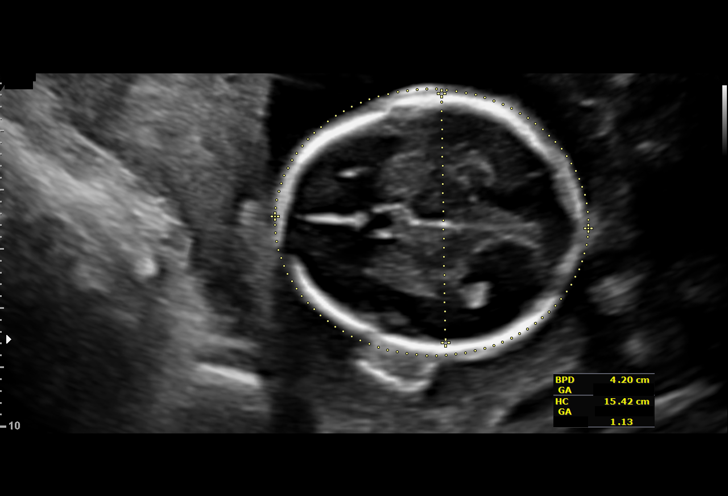
[im 52/82]
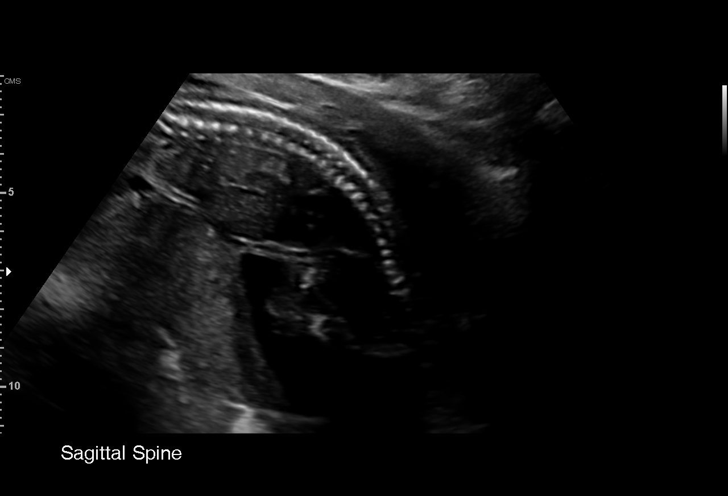
[im 58/82]
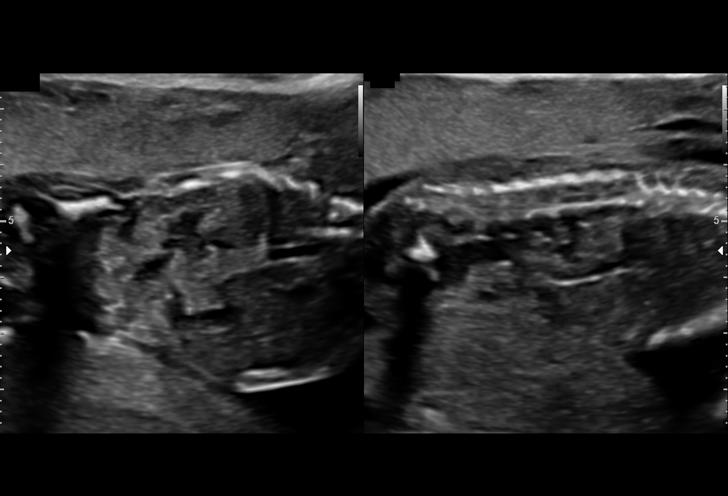
[im 64/82]
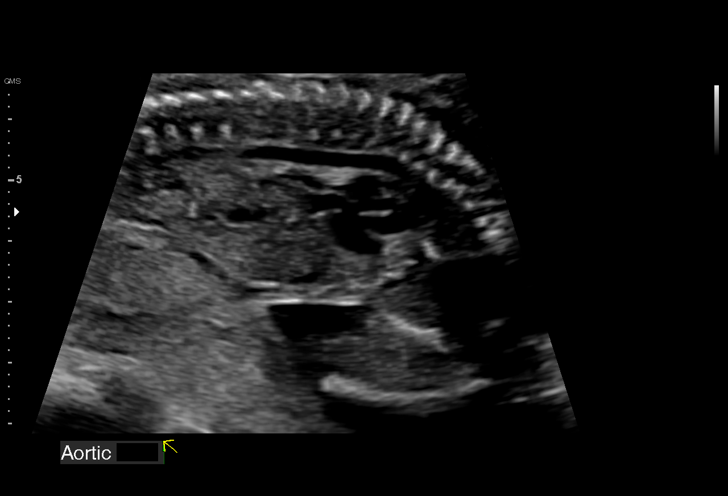
[im 70/82]
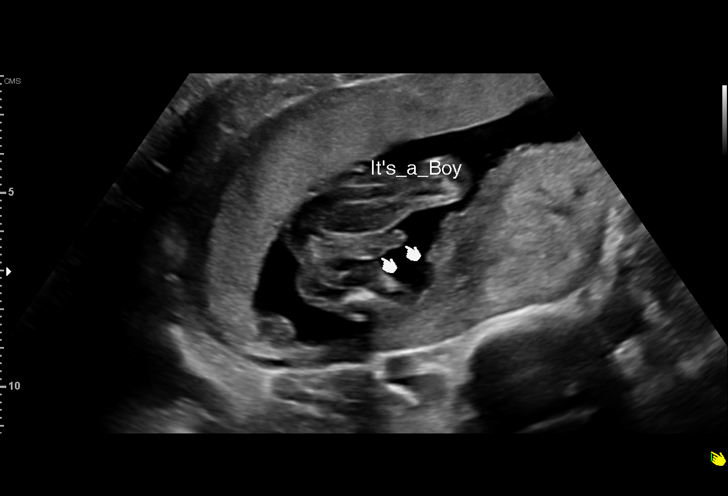
[im 76/82]
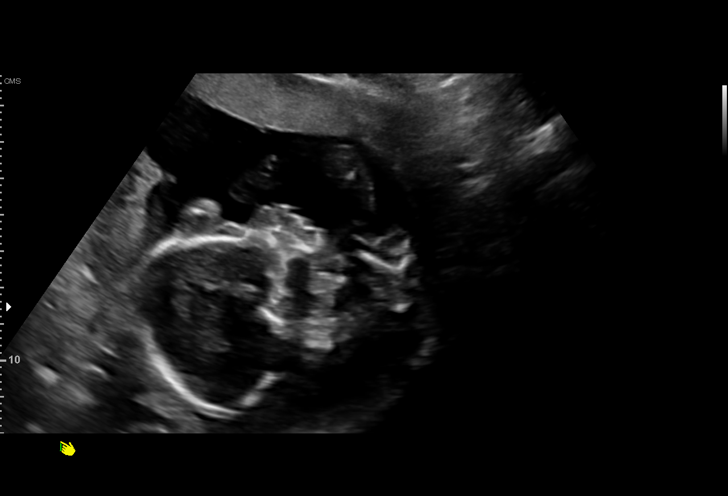
[im 82/82]
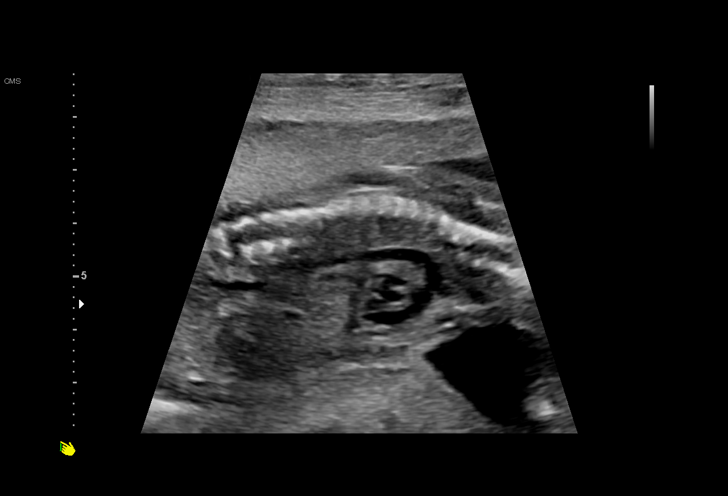

[14 of 28 positions shown; findings below may reference images not displayed]

OB/Gyn Clinic

1  WALTER JAIME CORTINA           [id number]      [id number]     [id number]
Indications

18 weeks gestation of pregnancy
Encounter for fetal anatomic survey
OB History

Gravidity:    2         Term:   1        Prem:   0        SAB:   0
TOP:          0       Ectopic:  0        Living: 1
Fetal Evaluation

Num Of Fetuses:     1
Fetal Heart         148
Rate(bpm):
Cardiac Activity:   Observed
Presentation:       Cephalic
Placenta:           Anterior, above cervical os
P. Cord Insertion:  Visualized

Amniotic Fluid
AFI FV:      Subjectively within normal limits

Largest Pocket(cm)
4.23
Biometry

BPD:      42.2  mm     G. Age:  18w 5d         60  %    CI:        73.41   %    70 - 86
FL/HC:      17.1   %    16.1 -
HC:      156.5  mm     G. Age:  18w 5d         42  %    HC/AC:      1.19        1.09 -
AC:      131.7  mm     G. Age:  18w 5d         50  %    FL/BPD:     63.5   %
FL:       26.8  mm     G. Age:  18w 1d         30  %    FL/AC:      20.3   %    20 - 24
HUM:      26.8  mm     G. Age:  18w 4d         51  %
CER:      19.7  mm     G. Age:  18w 6d         60  %
NFT:       5.4  mm
CM:        3.2  mm

Est. FW:     241  gm      0 lb 9 oz     44  %
Gestational Age

LMP:           19w 0d        Date:  08/01/17                 EDD:   05/08/18
U/S Today:     18w 4d                                        EDD:   05/11/18
Best:          18w 4d     Det. By:  Early Ultrasound         EDD:   05/11/18
(09/13/17)
Anatomy

Cranium:               Appears normal         Aortic Arch:            Appears normal
Cavum:                 Appears normal         Ductal Arch:            Appears normal
Ventricles:            Appears normal         Diaphragm:              Not well visualized
Choroid Plexus:        Appears normal         Stomach:                Appears normal, left
sided
Cerebellum:            Appears normal         Abdomen:                Appears normal
Posterior Fossa:       Appears normal         Abdominal Wall:         Appears nml (cord
insert, abd wall)
Nuchal Fold:           Appears normal         Cord Vessels:           Appears normal (3
vessel cord)
Face:                  Appears normal         Kidneys:                Appear normal
(orbits and profile)
Lips:                  Appears normal         Bladder:                Appears normal
Thoracic:              Appears normal         Spine:                  Limited views
appear normal
Heart:                 Not well visualized    Upper Extremities:      Appears normal
RVOT:                  Appears normal         Lower Extremities:      Appears normal
LVOT:                  Appears normal

Other:  Parents do not wish to know sex of fetus. Heels and 5th digit
visualized. Nasal bone visualized. Technically difficult due to fetal
position.
Cervix Uterus Adnexa

Cervix
Length:            3.3  cm.
Normal appearance by transabdominal scan.

Uterus
No abnormality visualized.

Left Ovary
Within normal limits.

Right Ovary
Not visualized.

Cul De Sac:   No free fluid seen.

Adnexa:       No abnormality visualized.
Impression

SIUP at 18+4 weeks
Normal detailed fetal anatomy; limited views of 4-chamber
and diaphragm
Markers of aneuploidy: none
Normal amniotic fluid volume
Measurements consistent with early US
Recommendations

Follow-up ultrasound in 4-6 weeks to complete anatomy
survey

## 2019-03-22 ENCOUNTER — Ambulatory Visit: Payer: Medicaid Other

## 2019-03-23 IMAGING — US US MFM UA CORD DOPPLER
1 series · 13 of 28 positions shown · non-contrast
Comparison: none

[Series 1: us mfm ua cord doppler · 61 acquisitions, 13 frames shown]
[im 3/61]
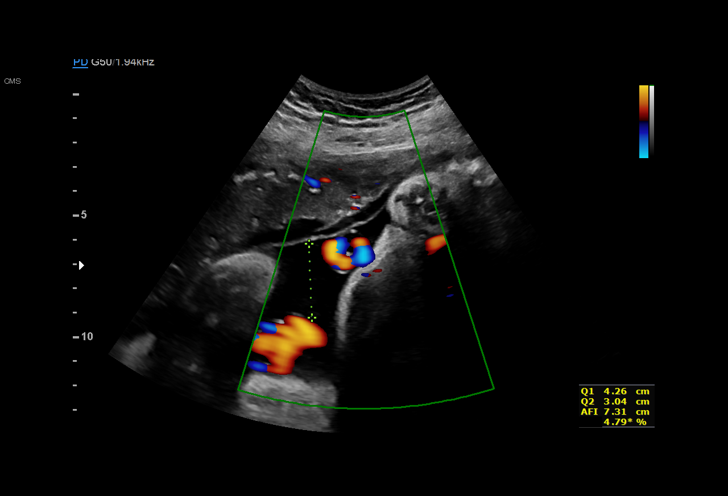
[im 7/61]
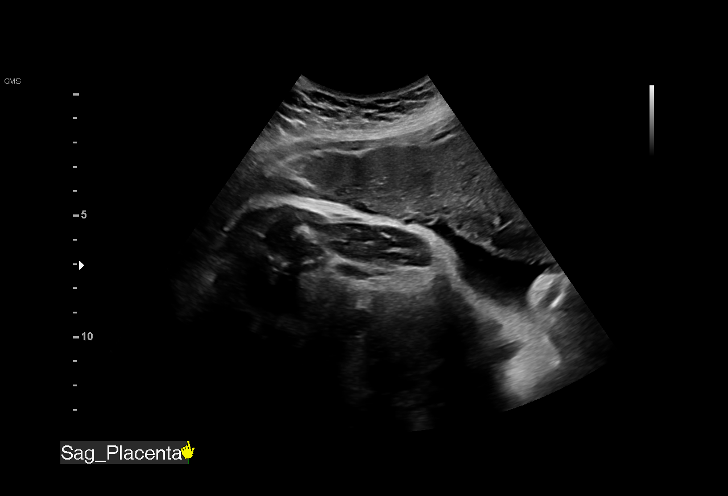
[im 12/61]
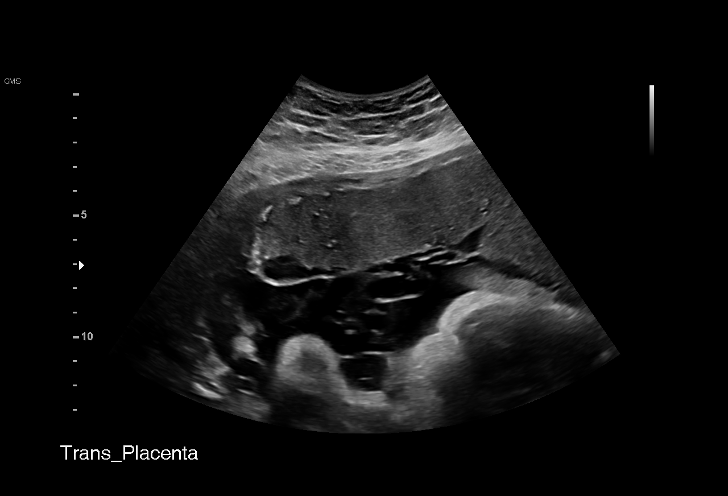
[im 16/61]
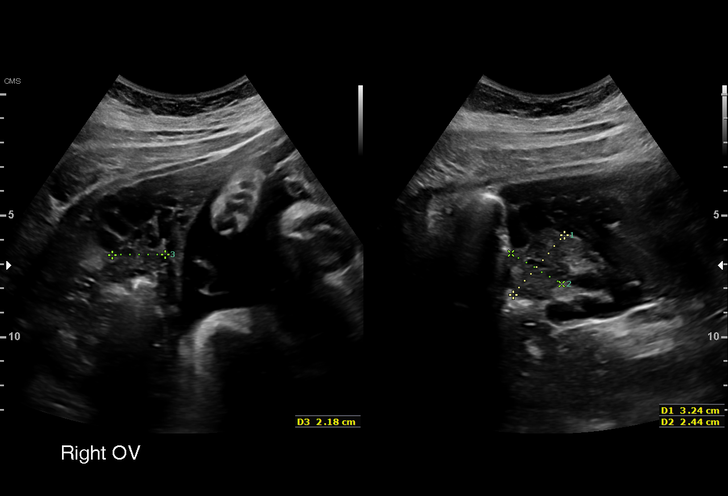
[im 21/61]
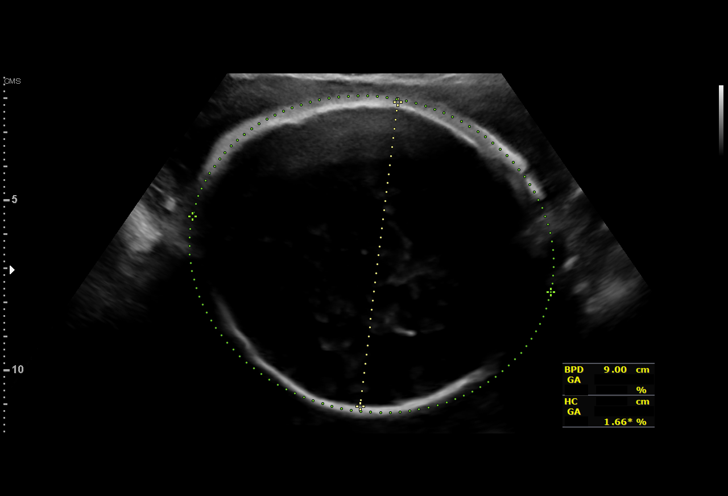
[im 25/61]
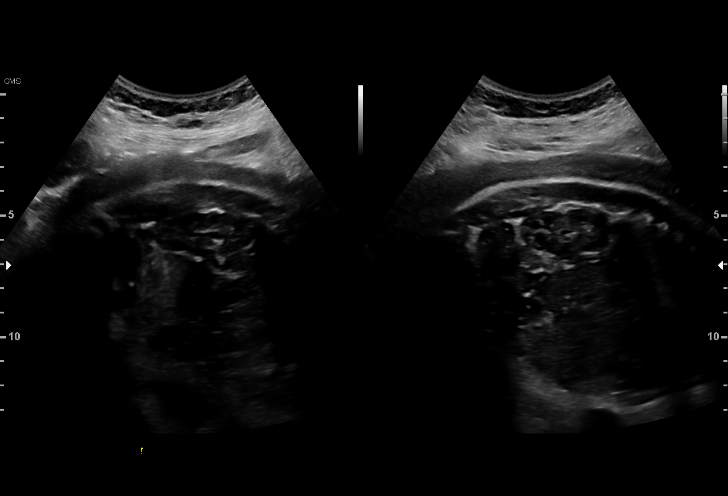
[im 32/61]
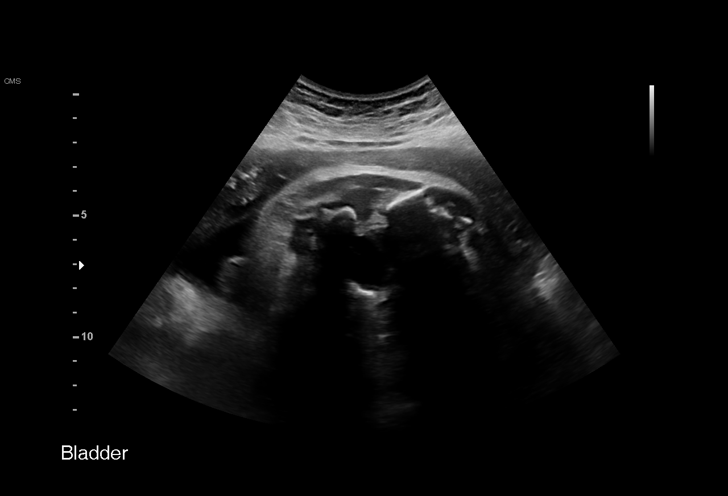
[im 36/61]
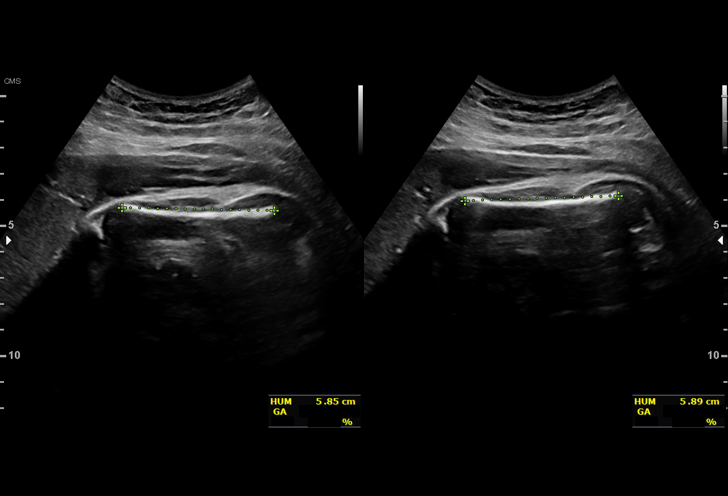
[im 41/61]
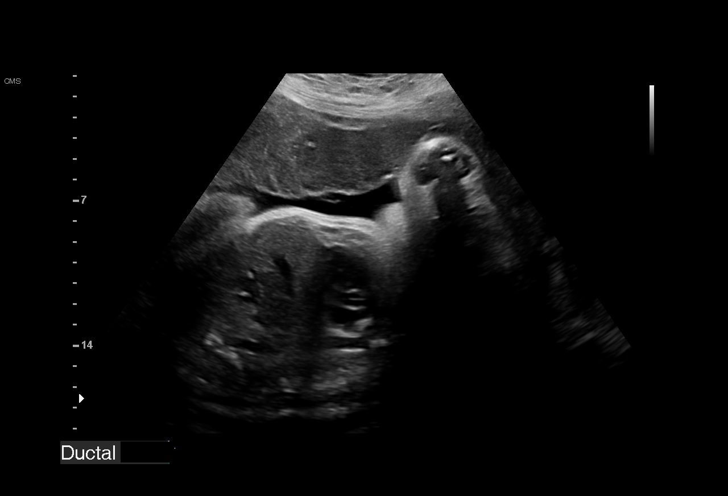
[im 45/61]
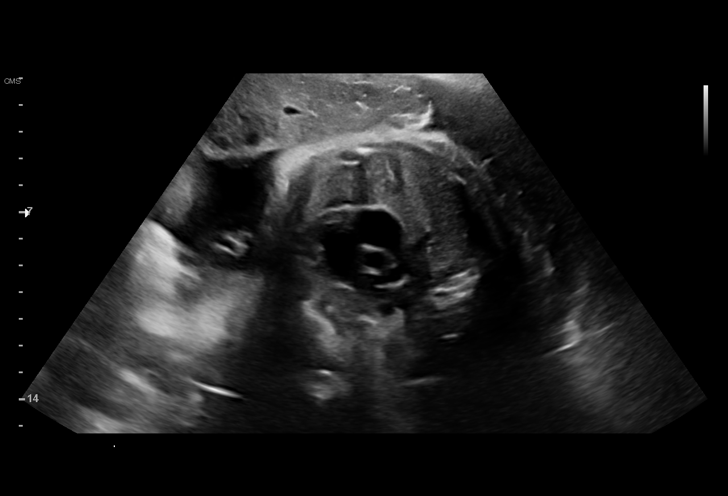
[im 49/61]
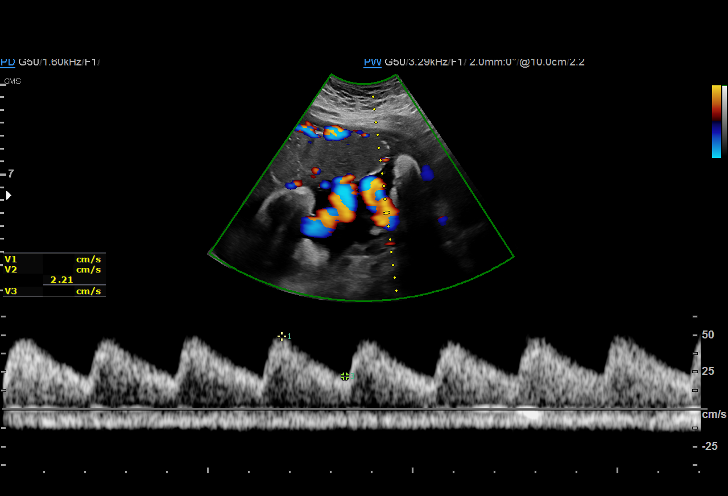
[im 54/61]
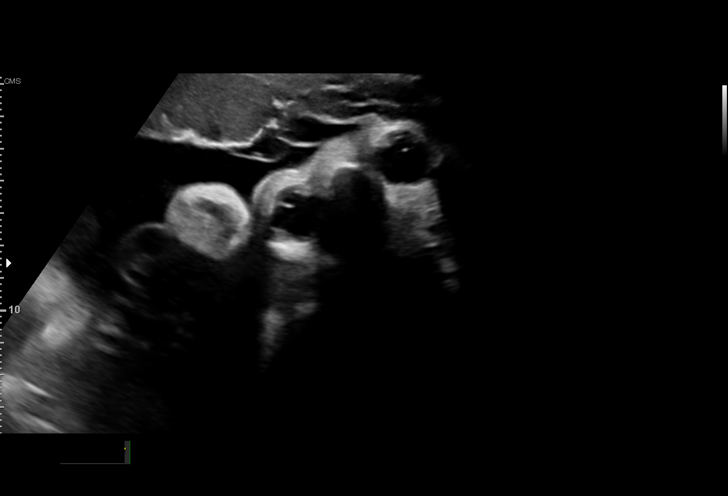
[im 58/61]
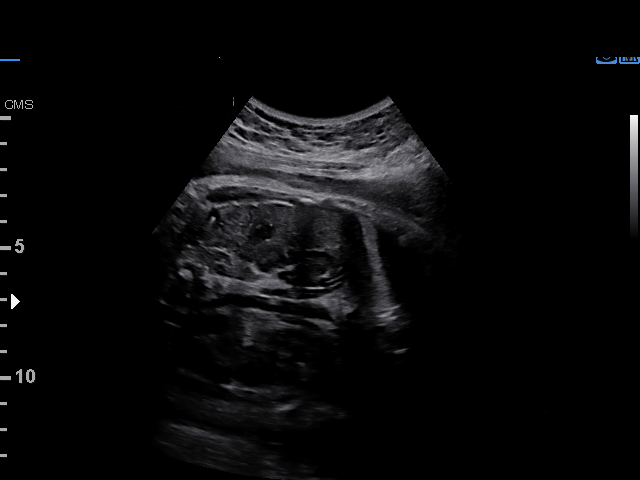

[13 of 28 positions shown; findings below may reference images not displayed]

OB/Gyn Clinic

1  SURYADI ROCKERZ           [id number]      [id number]     [id number]
2  SURYADI ROCKERZ           [id number]      [id number]     [id number]
3  SURYADI ROCKERZ           [id number]      [id number]     [id number]
Indications

37 weeks gestation of pregnancy
Encounter for other antenatal screening
follow-up
Uterine size-date discrepancy, third trimester
Poor obstetric history: Previous fetal growth
restriction (FGR)
OB History

Gravidity:    2         Term:   1        Prem:   0        SAB:   0
TOP:          0       Ectopic:  0        Living: 1
Fetal Evaluation

Num Of Fetuses:     1
Fetal Heart         155
Rate(bpm):
Cardiac Activity:   Observed
Presentation:       Cephalic
Placenta:           Anterior, above cervical os
P. Cord Insertion:  Visualized
Amniotic Fluid
AFI FV:      Subjectively within normal limits

AFI Sum(cm)     %Tile       Largest Pocket(cm)
15.24           58

RUQ(cm)       RLQ(cm)       LUQ(cm)        LLQ(cm)
4.26
Biophysical Evaluation

Amniotic F.V:   Within normal limits       F. Tone:        Observed
F. Movement:    Observed                   Score:          [DATE]
F. Breathing:   Observed
Biometry

BPD:        90  mm     G. Age:  36w 4d         38  %    CI:        81.39   %    70 - 86
FL/HC:      21.3   %    20.9 -
HC:      314.9  mm     G. Age:  35w 2d        < 3  %    HC/AC:      1.03        0.92 -
AC:      305.4  mm     G. Age:  34w 4d        < 3  %    FL/BPD:     74.6   %    71 - 87
FL:       67.1  mm     G. Age:  34w 4d        < 3  %    FL/AC:      22.0   %    20 - 24
HUM:      58.7  mm     G. Age:  34w 0d          6  %

Est. FW:    0672  gm      5 lb 9 oz     15  %
Gestational Age

LMP:           38w 0d        Date:  08/01/17                 EDD:   05/08/18
U/S Today:     35w 2d                                        EDD:   05/27/18
Best:          37w 4d     Det. By:  Early Ultrasound         EDD:   05/11/18
(09/13/17)
Anatomy

Cranium:               Appears normal         Aortic Arch:            Previously seen
Cavum:                 Previously seen        Ductal Arch:            Previously seen
Ventricles:            Previously seen        Diaphragm:              Appears normal
Choroid Plexus:        Previously seen        Stomach:                Appears normal, left
sided
Cerebellum:            Previously seen        Abdomen:                Appears normal
Posterior Fossa:       Previously seen        Abdominal Wall:         Previously seen
Nuchal Fold:           Previously seen        Cord Vessels:           Appears normal (3
vessel cord)
Face:                  Appears normal         Kidneys:                Appear normal
(orbits and profile)
Lips:                  Appears normal         Bladder:                Appears normal
Thoracic:              Appears normal         Spine:                  Limited views
previously seen
Heart:                 Not well visualized    Upper Extremities:      Previously seen
RVOT:                  Appears normal         Lower Extremities:      Previously seen
LVOT:                  Appears normal

Other:  Fetus appears to be a male. Heels and 5th digit previously visualized.
Nasal bone visualized. Technically difficult due to fetal position and
advanced GA.
Doppler - Fetal Vessels

Umbilical Artery
S/D     %tile                                            ADFV    RDFV
2.2       43                                                No      No
Cervix Uterus Adnexa

Cervix
Not visualized (advanced GA >35wks)

Uterus
No abnormality visualized.

Left Ovary
Not visualized.

Right Ovary
Within normal limits.

Cul De Sac:   No free fluid seen.

Adnexa:       No abnormality visualized.
Impression

Single living intrauterine pregnancy at 37w 4d.
Cephalic presentation.
Placenta Anterior, above cervical os.
Normal amniotic fluid volume.
EFW 15th%'le, AC <3rd%'le is consistent with IUGR
UA Doppler studies are normal
Normal interval fetal anatomy.
BPP [DATE].
Recommendations

Delivery 38-39 weeks for IUGR (uncomplicated).
Weekly Dopplers/BPP/AFI in the interim.

## 2019-08-22 ENCOUNTER — Other Ambulatory Visit: Payer: Self-pay

## 2019-08-22 DIAGNOSIS — Z20822 Contact with and (suspected) exposure to covid-19: Secondary | ICD-10-CM

## 2019-08-24 LAB — NOVEL CORONAVIRUS, NAA: SARS-CoV-2, NAA: NOT DETECTED

## 2022-02-23 DIAGNOSIS — Z3009 Encounter for other general counseling and advice on contraception: Secondary | ICD-10-CM | POA: Diagnosis not present

## 2022-02-23 DIAGNOSIS — Z0389 Encounter for observation for other suspected diseases and conditions ruled out: Secondary | ICD-10-CM | POA: Diagnosis not present

## 2022-02-23 DIAGNOSIS — Z1388 Encounter for screening for disorder due to exposure to contaminants: Secondary | ICD-10-CM | POA: Diagnosis not present

## 2023-05-25 ENCOUNTER — Encounter: Payer: Medicaid Other | Admitting: Certified Nurse Midwife

## 2023-06-02 ENCOUNTER — Encounter: Payer: Self-pay | Admitting: Obstetrics and Gynecology

## 2023-06-02 ENCOUNTER — Other Ambulatory Visit (HOSPITAL_COMMUNITY)
Admission: RE | Admit: 2023-06-02 | Discharge: 2023-06-02 | Disposition: A | Discharge status: Home / Self Care | Payer: Medicaid Other | Source: Ambulatory Visit | Attending: Certified Nurse Midwife | Admitting: Certified Nurse Midwife

## 2023-06-02 ENCOUNTER — Ambulatory Visit (INDEPENDENT_AMBULATORY_CARE_PROVIDER_SITE_OTHER): Payer: Medicaid Other | Admitting: Obstetrics and Gynecology

## 2023-06-02 ENCOUNTER — Other Ambulatory Visit: Payer: Self-pay

## 2023-06-02 VITALS — BP 136/91 | HR 79 | Wt 178.0 lb

## 2023-06-02 DIAGNOSIS — Z0000 Encounter for general adult medical examination without abnormal findings: Secondary | ICD-10-CM

## 2023-06-02 DIAGNOSIS — Z01419 Encounter for gynecological examination (general) (routine) without abnormal findings: Secondary | ICD-10-CM

## 2023-06-02 DIAGNOSIS — Z30433 Encounter for removal and reinsertion of intrauterine contraceptive device: Secondary | ICD-10-CM

## 2023-06-02 DIAGNOSIS — Z3202 Encounter for pregnancy test, result negative: Secondary | ICD-10-CM | POA: Diagnosis not present

## 2023-06-02 DIAGNOSIS — Z124 Encounter for screening for malignant neoplasm of cervix: Secondary | ICD-10-CM | POA: Diagnosis not present

## 2023-06-02 DIAGNOSIS — Z30430 Encounter for insertion of intrauterine contraceptive device: Secondary | ICD-10-CM | POA: Diagnosis not present

## 2023-06-02 LAB — POCT PREGNANCY, URINE: Preg Test, Ur: NEGATIVE

## 2023-06-02 MED ORDER — LEVONORGESTREL 20.1 MCG/DAY IU IUD
1.0000 | INTRAUTERINE_SYSTEM | Freq: Once | INTRAUTERINE | Status: AC
  Administered 2023-06-02: 1 via INTRAUTERINE

## 2023-06-02 NOTE — Progress Notes (Signed)
ANNUAL EXAM Patient name: Brittany Bowers MRN [id number]  Date of birth: 31-May-1996 Chief Complaint:   Contraception and Gynecologic Exam  History of Present Illness:   Brittany Bowers is a 27 y.o. [id number] being seen today for a routine annual exam.  Current complaints: IUD exchange  Has had prior IUD exchange; initial IUD placed in 2019 Has minimal menses with IUD  No intention of pregnancy (partner has 14 children and she has his last 2)  Menstrual concerns? No  light periods but will have PMS symptoms, but very light bleeding (using a liner)  Breast or nipple changes? No  Contraception use? Yes  Sexually active? Yes   No LMP recorded. (Menstrual status: IUD).   The pregnancy intention screening data noted above was reviewed. Potential methods of contraception were discussed. The patient elected to proceed with No data recorded.   Last pap     Component Value Date/Time   DIAGPAP  10/31/2017 0000    NEGATIVE FOR INTRAEPITHELIAL LESIONS OR MALIGNANCY.   ADEQPAP  10/31/2017 0000    Satisfactory for evaluation  endocervical/transformation zone component PRESENT.     Last mammogram: n/a Last colonoscopy: n/a.      08/28/2018    4:58 PM 04/27/2018    4:42 PM 04/14/2018    4:38 PM 03/14/2018    4:55 PM 01/11/2018   10:24 AM  Depression screen PHQ 2/9  Decreased Interest 0 1 1 2 2   Down, Depressed, Hopeless 0 1 2 2 1   PHQ - 2 Score 0 2 3 4 3   Altered sleeping 1 1 1 2 2   Tired, decreased energy 0 1 1 1 2   Change in appetite 0 1 2 1 2   Feeling bad or failure about yourself  0 0 0 0 0  Trouble concentrating 0 1 1 1 1   Moving slowly or fidgety/restless 0 0 0 0 0  Suicidal thoughts 0 0 0 0 0  PHQ-9 Score 1 6 8 9 10         08/28/2018    4:58 PM 04/27/2018    4:42 PM 04/14/2018    4:38 PM 03/14/2018    4:55 PM  GAD 7 : Generalized Anxiety Score  Nervous, Anxious, on Edge 1 1 2 1   Control/stop worrying 0 1 1 1   Worry too much - different things 1 1 1 1   Trouble  relaxing 1 1 1 1   Restless 0  1 0  Easily annoyed or irritable 0 1 1 1   Afraid - awful might happen 0 1 1 1   Total GAD 7 Score 3  8 6      Review of Systems:   Pertinent items are noted in HPI Denies any headaches, blurred vision, fatigue, shortness of breath, chest pain, abdominal pain, abnormal vaginal discharge/itching/odor/irritation, problems with periods, bowel movements, urination, or intercourse unless otherwise stated above. Pertinent History Reviewed:  Reviewed past medical,surgical, social and family history.  Reviewed problem list, medications and allergies. Physical Assessment:   Vitals:   06/02/23 0927  BP: (!) 136/91  Pulse: 79  Weight: 178 lb (80.7 kg)  Body mass index is 32.56 kg/m.        Physical Examination:   General appearance - well appearing, and in no distress  Mental status - alert, oriented to person, place, and time  Psych:  She has a normal mood and affect  Skin - warm and dry, normal color, no suspicious lesions noted  Chest - effort normal, all lung fields clear  to auscultation bilaterally  Heart - normal rate and regular rhythm  Breasts - breasts appear normal, no suspicious masses, no skin or nipple changes or  axillary nodes  Abdomen - soft, nontender, nondistended, no masses or organomegaly  Pelvic -  VULVA: normal appearing vulva with no masses, tenderness or lesions   VAGINA: normal appearing vagina with normal color and discharge, no lesions   CERVIX: normal appearing cervix without discharge or lesions, no CMT  Thin prep pap is done with HR HPV cotesting  UTERUS: uterus is felt to be normal size, shape, consistency and nontender   ADNEXA: No adnexal masses or tenderness noted.  Extremities:  No swelling or varicosities noted  Chaperone present for exam  No results found for this or any previous visit (from the past 24 hour(s)).   IUD Removal  Patient identified, informed consent performed, consent signed.  Patient was in the dorsal  lithotomy position, normal external genitalia was noted.  A speculum was placed in the patient's vagina, normal discharge was noted, no lesions. The cervix was visualized, no lesions, no abnormal discharge.  The strings of the IUD were not visualized, so Kelly forceps were introduced into the endometrial cavity and the IUD was grasped and removed in its entirety. Patient tolerated the procedure well.    IUD Insertion Procedure Note Patient identified, informed consent performed, consent signed.   Discussed risks of irregular bleeding, cramping, infection, malpositioning or misplacement of the IUD outside the uterus which may require further procedure such as laparoscopy. Also discussed >99% contraception efficacy, increased risk of ectopic pregnancy with failure of method.  Time out was performed.  Urine pregnancy test negative.  Speculum placed in the vagina.  Cervix visualized.  Cleaned with Betadine x 2.  Anterior cervix grasped with a single tooth tenaculum.  Paracervical block was not administered.  Liletta IUD placed per manufacturer's recommendations.  Strings trimmed to 3 cm. Tenaculum was removed, good hemostasis noted.  Patient tolerated procedure well.   Patient was given post-procedure instructions.  She was advised to have backup contraception for one week.  Patient was also asked to check IUD strings periodically and follow up prn for IUD check.    Assessment & Plan:  1. Annual physical exam - Cervical cancer screening: Discussed screening Q3 years. Reviewed importance of annual exams and limits of pap smear. Pap with reflex HPV collected - GC/CT: Discussed and recommended. Pt  accepts - Gardasil: has not yet had. Will provide information - Birth Control: IUD - Breast Health: Encouraged self breast awareness/exams.  - Follow-up: 12 months and prn   2. Pap smear for cervical cancer screening - RPR+HBsAg+HCVAb+... - Cervicovaginal ancillary only - Cytology - PAP  3. Encounter  for insertion of intrauterine contraceptive device (IUD) S/p uncomplicated IUD removal and insertion . - RPR+HBsAg+HCVAb+... - Cervicovaginal ancillary only - Cytology - PAP - levonorgestrel (LILETTA) 20.1 MCG/DAY IUD 1 each    Orders Placed This Encounter  Procedures   RPR+HBsAg+HCVAb+...   Pregnancy, urine POC    Meds:  Meds ordered this encounter  Medications   levonorgestrel (LILETTA) 20.1 MCG/DAY IUD 1 each    Follow-up: No follow-ups on file.  Lorriane Shire, MD 06/02/2023 9:34 AM

## 2023-06-03 LAB — CYTOLOGY - PAP: Diagnosis: NEGATIVE

## 2023-06-03 LAB — CERVICOVAGINAL ANCILLARY ONLY
Chlamydia: NEGATIVE
Comment: NEGATIVE
Comment: NEGATIVE
Comment: NORMAL
Neisseria Gonorrhea: NEGATIVE
Trichomonas: NEGATIVE

## 2023-06-04 LAB — RPR+HBSAG+HCVAB+...
HIV Screen 4th Generation wRfx: NONREACTIVE
Hep C Virus Ab: NONREACTIVE
Hepatitis B Surface Ag: NEGATIVE
RPR Ser Ql: NONREACTIVE

## 2023-10-13 ENCOUNTER — Other Ambulatory Visit: Payer: Self-pay

## 2023-10-13 ENCOUNTER — Encounter (HOSPITAL_COMMUNITY): Payer: Self-pay | Admitting: Emergency Medicine

## 2023-10-13 ENCOUNTER — Emergency Department (HOSPITAL_COMMUNITY)
Admission: EM | Admit: 2023-10-13 | Discharge: 2023-10-13 | Disposition: A | Discharge status: Home / Self Care | Payer: Self-pay

## 2023-10-13 ENCOUNTER — Emergency Department (HOSPITAL_COMMUNITY): Payer: Medicaid Other

## 2023-10-13 DIAGNOSIS — R55 Syncope and collapse: Secondary | ICD-10-CM | POA: Insufficient documentation

## 2023-10-13 DIAGNOSIS — H53149 Visual discomfort, unspecified: Secondary | ICD-10-CM | POA: Insufficient documentation

## 2023-10-13 DIAGNOSIS — E876 Hypokalemia: Secondary | ICD-10-CM | POA: Insufficient documentation

## 2023-10-13 DIAGNOSIS — R519 Headache, unspecified: Secondary | ICD-10-CM | POA: Insufficient documentation

## 2023-10-13 DIAGNOSIS — R079 Chest pain, unspecified: Secondary | ICD-10-CM | POA: Insufficient documentation

## 2023-10-13 LAB — CBC WITH DIFFERENTIAL/PLATELET
Abs Immature Granulocytes: 0.04 10*3/uL (ref 0.00–0.07)
Basophils Absolute: 0.1 10*3/uL (ref 0.0–0.1)
Basophils Relative: 1 %
Eosinophils Absolute: 0.1 10*3/uL (ref 0.0–0.5)
Eosinophils Relative: 1 %
HCT: 42.1 % (ref 36.0–46.0)
Hemoglobin: 14.3 g/dL (ref 12.0–15.0)
Immature Granulocytes: 1 %
Lymphocytes Relative: 36 %
Lymphs Abs: 2.8 10*3/uL (ref 0.7–4.0)
MCH: 30.7 pg (ref 26.0–34.0)
MCHC: 34 g/dL (ref 30.0–36.0)
MCV: 90.3 fL (ref 80.0–100.0)
Monocytes Absolute: 0.6 10*3/uL (ref 0.1–1.0)
Monocytes Relative: 8 %
Neutro Abs: 4.2 10*3/uL (ref 1.7–7.7)
Neutrophils Relative %: 53 %
Platelets: 283 10*3/uL (ref 150–400)
RBC: 4.66 MIL/uL (ref 3.87–5.11)
RDW: 13.7 % (ref 11.5–15.5)
WBC: 7.8 10*3/uL (ref 4.0–10.5)
nRBC: 0 % (ref 0.0–0.2)

## 2023-10-13 LAB — BASIC METABOLIC PANEL
Anion gap: 11 (ref 5–15)
BUN: 10 mg/dL (ref 6–20)
CO2: 23 mmol/L (ref 22–32)
Calcium: 9.7 mg/dL (ref 8.9–10.3)
Chloride: 105 mmol/L (ref 98–111)
Creatinine, Ser: 0.97 mg/dL (ref 0.44–1.00)
GFR, Estimated: >60 mL/min (ref >60)
Glucose, Bld: 63 mg/dL — ABNORMAL LOW (ref 70–99)
Potassium: 3.4 mmol/L — ABNORMAL LOW (ref 3.5–5.1)
Sodium: 139 mmol/L (ref 135–145)

## 2023-10-13 LAB — URINALYSIS, ROUTINE W REFLEX MICROSCOPIC
Bilirubin Urine: NEGATIVE
Glucose, UA: NEGATIVE mg/dL
Hgb urine dipstick: NEGATIVE
Ketones, ur: NEGATIVE mg/dL
Nitrite: NEGATIVE
Protein, ur: 30 mg/dL — AB
Specific Gravity, Urine: 1.013 (ref 1.005–1.030)
Squamous Epithelial / HPF: >50 /[HPF] (ref 0–5)
pH: 7 (ref 5.0–8.0)

## 2023-10-13 LAB — PREGNANCY, URINE: Preg Test, Ur: NEGATIVE

## 2023-10-13 MED ORDER — LIDOCAINE 5 % EX PTCH: 1.0000 | MEDICATED_PATCH | CUTANEOUS | 0 refills | Status: DC

## 2023-10-13 MED ORDER — KETOROLAC TROMETHAMINE 30 MG/ML IJ SOLN
30.0000 mg | Freq: Once | INTRAMUSCULAR | Status: AC
  Administered 2023-10-13: 30 mg via INTRAVENOUS
  Filled 2023-10-13: qty 1

## 2023-10-13 MED ORDER — DIPHENHYDRAMINE HCL 25 MG PO TABS: 25.0000 mg | ORAL_TABLET | Freq: Four times a day (QID) | ORAL | 0 refills | Status: DC | PRN

## 2023-10-13 MED ORDER — PROCHLORPERAZINE EDISYLATE 10 MG/2ML IJ SOLN
10.0000 mg | Freq: Once | INTRAMUSCULAR | Status: AC
  Administered 2023-10-13: 10 mg via INTRAVENOUS
  Filled 2023-10-13: qty 2

## 2023-10-13 MED ORDER — METOCLOPRAMIDE HCL 10 MG PO TABS: 10.0000 mg | ORAL_TABLET | Freq: Four times a day (QID) | ORAL | 0 refills | Status: DC

## 2023-10-13 NOTE — ED Notes (Signed)
Patient also complains of a headache.

## 2023-10-13 NOTE — ED Triage Notes (Signed)
Patient presents due to feeling lightheaded. That was followed by anxiety, chest tightness and tingling in the fingers. EMS noted shallow deep breaths. 325 of Aspirin was given by Fire. She reports having a near syncopal issue in the past.   EMS vitals: 124/86 BP 88 HR 26 RR 116 CBG

## 2023-10-13 NOTE — ED Provider Notes (Signed)
[place] [facility] AT [facility] Provider Note   CSN: [id number] Arrival date & time: 10/13/23  2028     History  Chief Complaint  Patient presents with   Near Syncope   Chest Pain    Brittany Bowers is a 27 y.o. female past medical history of anxiety presents today for intermittent lightheadedness followed by anxiety, chest tightness and tingling in her fingers.  Patient states that she feels her heart racing and breathing heavily during episodes.  She notes that all of her symptoms resolved within 30 minutes of the episodes.  Patient reports bandlike headache with mild photophobia and nausea which is consistent with previous migraine/headaches she is experienced.  She also notes blurring vision and mild nausea during episodes.  Patient denies any new stresses in life or past history of anxiety despite this being in her medical history.  Patient denies fever, vomiting, diarrhea, or upper respiratory symptoms.   Near Syncope Associated symptoms include chest pain.  Chest Pain Associated symptoms: near-syncope, numbness and palpitations        Home Medications Prior to Admission medications   Medication Sig Start Date End Date Taking? Authorizing Provider  diphenhydrAMINE (BENADRYL) 25 MG tablet Take 1 tablet (25 mg total) by mouth every 6 (six) hours as needed. 10/13/23  Yes Dolphus Jenny, PA-C  lidocaine (LIDODERM) 5 % Place 1 patch onto the skin daily. Remove & Discard patch within 12 hours or as directed by MD 10/13/23  Yes Dolphus Jenny, PA-C  metoCLOPramide (REGLAN) 10 MG tablet Take 1 tablet (10 mg total) by mouth every 6 (six) hours. 10/13/23  Yes Dolphus Jenny, PA-C  beclomethasone (QVAR) 40 MCG/ACT inhaler Inhale 2 puffs into the lungs 2 (two) times daily as needed (asthma). Patient not taking: Reported on 06/02/2023    [provider]  cephALEXin (KEFLEX) 500 MG capsule Take 1 capsule (500 mg total) by mouth 4 (four) times  daily. Patient not taking: Reported on 06/02/2023 09/28/18   Ward, Layla Maw, DO  Elastic Bandages & Supports (COMFORT FIT MATERNITY SUPP LG) MISC Needs one maternity support belt Patient not taking: Reported on 04/27/2018 04/20/18   Currie Paris, NP  ranitidine (ZANTAC) 150 MG tablet Take 1 tablet (150 mg total) by mouth 2 (two) times daily. Patient not taking: Reported on 06/02/2023 01/02/18   Marylene Land, CNM      Allergies    Bee venom, Onion, and Pork-derived products    Review of Systems   Review of Systems  Respiratory:  Positive for chest tightness.   Cardiovascular:  Positive for chest pain, palpitations and near-syncope.  Neurological:  Positive for light-headedness and numbness.    Physical Exam Updated Vital Signs BP 130/81   Pulse 92   Temp 98.3 F (36.8 C)   Resp 15   SpO2 97%  Physical Exam Vitals and nursing note reviewed.  Constitutional:      General: She is not in acute distress.    Appearance: She is well-developed. She is not ill-appearing.  HENT:     Head: Normocephalic and atraumatic.  Eyes:     Extraocular Movements: Extraocular movements intact.     Conjunctiva/sclera: Conjunctivae normal.     Pupils: Pupils are equal, round, and reactive to light.  Cardiovascular:     Rate and Rhythm: Normal rate and regular rhythm.     Heart sounds: No murmur heard. Pulmonary:     Effort: Pulmonary effort is normal. No respiratory distress.  Breath sounds: Normal breath sounds.  Abdominal:     Palpations: Abdomen is soft.     Tenderness: There is no abdominal tenderness.  Musculoskeletal:        General: No swelling. Normal range of motion.     Cervical back: Normal range of motion and neck supple.     Comments: Reproducible chest tenderness  Skin:    General: Skin is warm and dry.     Capillary Refill: Capillary refill takes less than 2 seconds.  Neurological:     General: No focal deficit present.     Mental Status: She is alert.      Cranial Nerves: No cranial nerve deficit.     Motor: No weakness.  Psychiatric:        Attention and Perception: Attention normal.        Mood and Affect: Mood is anxious. Affect is tearful.     ED Results / Procedures / Treatments   Labs (all labs ordered are listed, but only abnormal results are displayed) Labs Reviewed  BASIC METABOLIC PANEL - Abnormal; Notable for the following components:      Result Value   Potassium 3.4 (*)    Glucose, Bld 63 (*)    All other components within normal limits  URINALYSIS, ROUTINE W REFLEX MICROSCOPIC - Abnormal; Notable for the following components:   APPearance CLOUDY (*)    Protein, ur 30 (*)    Leukocytes,Ua LARGE (*)    Bacteria, UA RARE (*)    All other components within normal limits  CBC WITH DIFFERENTIAL/PLATELET  PREGNANCY, URINE    EKG EKG Interpretation Date/Time:  Thursday October 13 2023 20:53:43 EST Ventricular Rate:  90 PR Interval:  178 QRS Duration:  75 QT Interval:  357 QTC Calculation: 437 R Axis:   49  Text Interpretation: Sinus rhythm Confirmed by Estanislado Pandy [phone]) on 10/13/2023 10:56:58 PM  Radiology DG Chest 2 View  Result Date: 10/13/2023 CLINICAL DATA:  Near syncope EXAM: CHEST - 2 VIEW COMPARISON:  None Available. FINDINGS: The heart size and mediastinal contours are within normal limits. Both lungs are clear. The visualized skeletal structures are unremarkable. IMPRESSION: No active cardiopulmonary disease. Electronically Signed   By: Jasmine Pang M.D.   On: 10/13/2023 22:22    Procedures Procedures    Medications Ordered in ED Medications  ketorolac (TORADOL) 30 MG/ML injection 30 mg (has no administration in time range)  prochlorperazine (COMPAZINE) injection 10 mg (has no administration in time range)    ED Course/ Medical Decision Making/ A&P                                 Medical Decision Making Amount and/or Complexity of Data Reviewed Labs: ordered.   This patient presents  to the ED with chief complaint(s) of lightheaded, anxiety, chest tightness with pertinent past medical history of anxiety which further complicates the presenting complaint. The complaint involves an extensive differential diagnosis and also carries with it a high risk of complications and morbidity.    The differential diagnosis includes anxiety, arrhythmia, electrolyte abnormality  Additional history obtained: Records reviewed Primary Care Documents  ED Course and Reassessment:   Independent labs interpretation:  The following labs were independently interpreted:  Urine pregnancy: Negative UA: Large leuks, 30 protein, rare bacteria Greater than 50 squamous epithelial, 21-50 WBCs likely due to improper clean-catch technique [id number] hypokalemia CBC: No notable findings EKG: Sinus rhythm  Independent visualization of imaging: - I independently visualized the following imaging with scope of interpretation limited to determining acute life threatening conditions related to emergency care: Chest x-ray, which revealed no active cardiopulmonary disease  Consultation: - Consulted or discussed management/test interpretation w/ external professional: None  Consideration for admission or further workup: Consider admission or further workup however patient's vital signs, physical exam, labs, and imaging have all been reassuring.  Patient symptoms could be due to anxiety.  Patient should have outpatient follow-up with primary care physician if symptoms persist for further evaluation.  Patient given outpatient treatment for nausea/headache.        Final Clinical Impression(s) / ED Diagnoses Final diagnoses:  Near syncope  Bad headache    Rx / DC Orders ED Discharge Orders          Ordered    metoCLOPramide (REGLAN) 10 MG tablet  Every 6 hours        10/13/23 2313    diphenhydrAMINE (BENADRYL) 25 MG tablet  Every 6 hours PRN        10/13/23 2313    lidocaine (LIDODERM) 5 %  Every 24  hours        10/13/23 2313              Dolphus Jenny, PA-C 10/13/23 2318    Coral Spikes, DO 10/13/23 2333

## 2023-10-13 NOTE — Discharge Instructions (Addendum)
Today you were seen for near syncope and bad headache. Please pick up your medications and take as prescribed. Your symptoms persist please follow-up with your primary care physician for further evaluation and treatment.  Thank you for letting us treat you today. After reviewing your labs and imaging, I feel you are safe to go home. Please follow up with your PCP in the next several days and provide them with your records from this visit. Return to the Emergency Room if pain becomes severe or symptoms worsen.

## 2023-12-20 NOTE — Progress Notes (Signed)
 Subjective:   Chief Complaint  Patient presents with  . Generalized Body Aches     History of Present Illness The patient is a 28 year old female who presents for evaluation of body aches.  She reports experiencing generalized body aches, with the most severe pain localized to her fingers. These symptoms began this morning. She has not had any fever, which was confirmed by a temperature check at her workplace earlier today. Despite the absence of fever, she was sent home by her supervisor. She does not have any cough but notes the recent onset of rhinorrhea, which she attributes to mask usage. She does not have any sore throat, vomiting, or diarrhea. She has no known allergies. She attempted to alleviate her discomfort with Tylenol  while at work, but it proved ineffective. She is open to the use of ibuprofen  for symptom management.  She has been drinking plenty of water today.  She denies abdominal pain or burning upon urination.   She has a history of migraines and is currently on medication for this condition, although she is unable to recall the name of the drug, she says it starts with an S .   ALLERGIES The patient has no known allergies.  MEDICATIONS Current: Tylenol    Parts of patient history reviewed include PMH, problem list, medications, allergies, and social history.  Objective:   Vitals:   12/20/23 1754  BP: (!) 159/107  Pulse: 95  Resp: 20  Temp: 99.7 F (37.6 C)  TempSrc: Tympanic  SpO2: 100%  Weight: 77.1 kg (170 lb)  Height: 1.549 m (5' 1)    Physical Exam Vitals and nursing note reviewed. Exam conducted with a chaperone present.  Constitutional:      Appearance: She is not ill-appearing.     Comments: Appears uncomfortable   HENT:     Head: Normocephalic and atraumatic.     Right Ear: Tympanic membrane, ear canal and external ear normal.     Left Ear: Tympanic membrane, ear canal and external ear normal.     Nose: Congestion and rhinorrhea  present.     Mouth/Throat:     Mouth: Mucous membranes are moist.     Pharynx: Posterior oropharyngeal erythema present. No oropharyngeal exudate.  Eyes:     Extraocular Movements: Extraocular movements intact.     Conjunctiva/sclera: Conjunctivae normal.     Pupils: Pupils are equal, round, and reactive to light.  Cardiovascular:     Rate and Rhythm: Normal rate and regular rhythm.     Pulses: Normal pulses.     Heart sounds: Normal heart sounds.  Pulmonary:     Effort: Pulmonary effort is normal.     Breath sounds: Normal breath sounds.  Abdominal:     General: Abdomen is flat.  Musculoskeletal:        General: Normal range of motion.     Cervical back: Neck supple.  Skin:    General: Skin is warm and dry.     Capillary Refill: Capillary refill takes less than 2 seconds.  Neurological:     General: No focal deficit present.     Mental Status: She is alert and oriented to person, place, and time.  Psychiatric:        Mood and Affect: Mood normal.        Behavior: Behavior normal.        Thought Content: Thought content normal.        Judgment: Judgment normal.  Assessment/Plan:   Brittany Bowers was seen today for generalized body aches.  Diagnoses and all orders for this visit:  Fever, unspecified fever cause -     POC Influenza A&B NAT (IDNOW) -     ibuprofen  (MOTRIN ) tablet 800 mg  Body aches -     POC Influenza A&B NAT (IDNOW) -     ibuprofen  (MOTRIN ) tablet 800 mg     Assessment & Plan She reports experiencing generalized body aches, with the most severe pain localized to her fingers. These symptoms began this morning. She has not had any fever, which was confirmed by a temperature check at her workplace earlier today. Despite the absence of fever, she was sent home by her supervisor. She does not have any cough but notes the recent onset of rhinorrhea, which she attributes to mask usage. She does not have any sore throat, vomiting, or diarrhea. She has no  known allergies. She attempted to alleviate her discomfort with Tylenol  while at work, but it proved ineffective. She is open to the use of ibuprofen  for symptom management.  She has been drinking plenty of water today.  She denies abdominal pain or burning upon urination.     Symptoms most consistent with viral process, no evidence of an bacterial infections including pneumonia, otitis media, acute pharyngitis, sepsis, peritonitis.  No evidence of dehydration.  She did get significant pain relief with administration of ibuprofen  in the clinic and blood pressure improved,  recommended supportive care and close follow up with primary care doctor if not improving in the next 2-3 days.   Follow up with PCP   Electronically signed by: Dorothe Macario Jakob, NP 12/20/2023 6:00 PM

## 2024-04-23 ENCOUNTER — Telehealth: Admitting: Physician Assistant

## 2024-04-23 DIAGNOSIS — A084 Viral intestinal infection, unspecified: Secondary | ICD-10-CM | POA: Diagnosis not present

## 2024-04-23 MED ORDER — ONDANSETRON 4 MG PO TBDP: 4.0000 mg | ORAL_TABLET | Freq: Three times a day (TID) | ORAL | 0 refills | Status: DC | PRN

## 2024-04-23 NOTE — Progress Notes (Signed)
 Virtual Visit Consent   Brittany Bowers, you are scheduled for a virtual visit with a [facility] provider today. Just as with appointments in the office, your consent must be obtained to participate. Your consent will be active for this visit and any virtual visit you may have with one of our providers in the next 365 days. If you have a MyChart account, a copy of this consent can be sent to you electronically.  As this is a virtual visit, video technology does not allow for your provider to perform a traditional examination. This may limit your provider's ability to fully assess your condition. If your provider identifies any concerns that need to be evaluated in person or the need to arrange testing (such as labs, EKG, etc.), we will make arrangements to do so. Although advances in technology are sophisticated, we cannot ensure that it will always work on either your end or our end. If the connection with a video visit is poor, the visit may have to be switched to a telephone visit. With either a video or telephone visit, we are not always able to ensure that we have a secure connection.  By engaging in this virtual visit, you consent to the provision of healthcare and authorize for your insurance to be billed (if applicable) for the services provided during this visit. Depending on your insurance coverage, you may receive a charge related to this service.  I need to obtain your verbal consent now. Are you willing to proceed with your visit today? Brittany Bowers has provided verbal consent on 04/23/2024 for a virtual visit (video or telephone). Brittany Kelp, PA-C  Date: 04/23/2024 8:11 AM   Virtual Visit via Video Note   I, Brittany Bowers, connected with  Brittany Bowers  ([id number], [birth year]) on 04/23/24 at  8:00 AM EDT by a video-enabled telemedicine application and verified that I am speaking with the correct person using two identifiers.  Location: Patient: Virtual Visit  Location Patient: Home Provider: Virtual Visit Location Provider: Home Office   I discussed the limitations of evaluation and management by telemedicine and the availability of in person appointments. The patient expressed understanding and agreed to proceed.    History of Present Illness: Brittany Bowers is a 28 y.o. who identifies as a female who was assigned female at birth, and is being seen today for nausea, vomiting, diarrhea.  HPI: Emesis  This is a new problem. The current episode started in the past 7 days (Started Friday, 04/20/24). The problem occurs 5 to 10 times per day. The problem has been resolved. The emesis has an appearance of stomach contents. There has been no fever. Associated symptoms include abdominal pain, diarrhea and myalgias. Pertinent negatives include no chills, fever or headaches. Risk factors include suspect food intake (Suspect Ichiban). She has tried increased fluids and sleep for the symptoms. The treatment provided no relief.    Problems:  Patient Active Problem List   Diagnosis Date Noted   Poor fetal growth affecting management of mother in third trimester    Status post induced vaginal delivery for [facility] 10/31/2017   Anxiety 10/31/2017    Allergies:  Allergies  Allergen Reactions   Bee Venom Swelling   Onion Other (See Comments)    Upset stomach   Pork-Derived Products Other (See Comments)    Religious preference   Medications:  Current Outpatient Medications:    ondansetron  (ZOFRAN -ODT) 4 MG disintegrating tablet, Take 1 tablet (4 mg total) by  mouth every 8 (eight) hours as needed., Disp: 20 tablet, Rfl: 0   beclomethasone (QVAR) 40 MCG/ACT inhaler, Inhale 2 puffs into the lungs 2 (two) times daily as needed (asthma). (Patient not taking: Reported on 06/02/2023), Disp: , Rfl:    cephALEXin  (KEFLEX ) 500 MG capsule, Take 1 capsule (500 mg total) by mouth 4 (four) times daily. (Patient not taking: Reported on 06/02/2023), Disp: 28 capsule, Rfl: 0    diphenhydrAMINE  (BENADRYL ) 25 MG tablet, Take 1 tablet (25 mg total) by mouth every 6 (six) hours as needed., Disp: 30 tablet, Rfl: 0   Elastic Bandages & Supports (COMFORT FIT MATERNITY SUPP LG) MISC, Needs one maternity support belt (Patient not taking: Reported on 04/27/2018), Disp: 1 each, Rfl: 0   lidocaine  (LIDODERM ) 5 %, Place 1 patch onto the skin daily. Remove & Discard patch within 12 hours or as directed by MD, Disp: 30 patch, Rfl: 0   metoCLOPramide  (REGLAN ) 10 MG tablet, Take 1 tablet (10 mg total) by mouth every 6 (six) hours., Disp: 30 tablet, Rfl: 0   ranitidine  (ZANTAC ) 150 MG tablet, Take 1 tablet (150 mg total) by mouth 2 (two) times daily. (Patient not taking: Reported on 06/02/2023), Disp: 60 tablet, Rfl: 0  Current Facility-Administered Medications:    enalapril  (VASOTEC ) tablet 5 mg, 5 mg, Oral, Daily, Shira Dopp, Adonis Hoot, MD  Observations/Objective: Patient is well-developed, well-nourished in no acute distress.  Resting comfortably at home.  Head is normocephalic, atraumatic.  No labored breathing.  Speech is clear and coherent with logical content.  Patient is alert and oriented at baseline.    Assessment and Plan: 1. Viral gastroenteritis (Primary) - ondansetron  (ZOFRAN -ODT) 4 MG disintegrating tablet; Take 1 tablet (4 mg total) by mouth every 8 (eight) hours as needed.  Dispense: 20 tablet; Refill: 0  - Resolved, but zofran  provided for any residual nausea or GI upset as she increases diet - Continue to push fluids - Increase diet as tolerated - Return to work note provided - Seek further evaluation if symptoms recur  Follow Up Instructions: I discussed the assessment and treatment plan with the patient. The patient was provided an opportunity to ask questions and all were answered. The patient agreed with the plan and demonstrated an understanding of the instructions.  A copy of instructions were sent to the patient via MyChart unless otherwise noted below.     The patient was advised to call back or seek an in-person evaluation if the symptoms worsen or if the condition fails to improve as anticipated.    Brittany Kelp, PA-C

## 2024-04-23 NOTE — Patient Instructions (Signed)
 Brittany Bowers, thank you for joining [name], PA-C for today's virtual visit.  While this provider is not your primary care provider (PCP), if your PCP is located in our provider database this encounter information will be shared with them immediately following your visit.   A [facility] MyChart account gives you access to today's visit and all your visits, tests, and labs performed at [facility] " click here if you don't have a [facility] MyChart account or go to [email or web address]  Consent: (Patient) Brittany Bowers provided verbal consent for this virtual visit at the beginning of the encounter.  Current Medications:  Current Outpatient Medications:    ondansetron  (ZOFRAN -ODT) 4 MG disintegrating tablet, Take 1 tablet (4 mg total) by mouth every 8 (eight) hours as needed., Disp: 20 tablet, Rfl: 0   beclomethasone (QVAR) 40 MCG/ACT inhaler, Inhale 2 puffs into the lungs 2 (two) times daily as needed (asthma). (Patient not taking: Reported on 06/02/2023), Disp: , Rfl:    cephALEXin  (KEFLEX ) 500 MG capsule, Take 1 capsule (500 mg total) by mouth 4 (four) times daily. (Patient not taking: Reported on 06/02/2023), Disp: 28 capsule, Rfl: 0   diphenhydrAMINE  (BENADRYL ) 25 MG tablet, Take 1 tablet (25 mg total) by mouth every 6 (six) hours as needed., Disp: 30 tablet, Rfl: 0   Elastic Bandages & Supports (COMFORT FIT MATERNITY SUPP LG) MISC, Needs one maternity support belt (Patient not taking: Reported on 04/27/2018), Disp: 1 each, Rfl: 0   lidocaine  (LIDODERM ) 5 %, Place 1 patch onto the skin daily. Remove & Discard patch within 12 hours or as directed by MD, Disp: 30 patch, Rfl: 0   metoCLOPramide  (REGLAN ) 10 MG tablet, Take 1 tablet (10 mg total) by mouth every 6 (six) hours., Disp: 30 tablet, Rfl: 0   ranitidine  (ZANTAC ) 150 MG tablet, Take 1 tablet (150 mg total) by mouth 2 (two) times daily. (Patient not taking: Reported on 06/02/2023), Disp: 60 tablet, Rfl:  0  Current Facility-Administered Medications:    enalapril  (VASOTEC ) tablet 5 mg, 5 mg, Oral, Daily, Leggett, Kelly H, MD   Medications ordered in this encounter:  Meds ordered this encounter  Medications   ondansetron  (ZOFRAN -ODT) 4 MG disintegrating tablet    Sig: Take 1 tablet (4 mg total) by mouth every 8 (eight) hours as needed.    Dispense:  20 tablet    Refill:  0    Supervising Provider:   LAMPTEY, PHILIP O [[id number]]     *If you need refills on other medications prior to your next appointment, please contact your pharmacy*  Follow-Up: Call back or seek an in-person evaluation if the symptoms worsen or if the condition fails to improve as anticipated.  [facility] [phone]  Other Instructions  Gastroenteritis You will learn about gastroenteritis, or stomach flu, symptoms, treatment, and when to seek medical care. To view the content, go to this web address: [email or web address]  This video will expire on: 10/26/2025. If you need access to this video following this date, please reach out to the healthcare provider who assigned it to you. This information is not intended to replace advice given to you by your health care provider. Make sure you discuss any questions you have with your health care provider. Elsevier Patient Education  2024 Elsevier Inc.   If you have been instructed to have an in-person evaluation today at a local Urgent Care facility, please use the link below. It will take you to a list  of all of our available [facility], including address, phone number and hours of operation. Please do not delay care.  [facility]  If you or a family member do not have a primary care provider, use the link below to schedule a visit and establish care. When you choose a [facility] primary care physician or advanced practice provider, you gain a long-term partner in health. Find a Primary Care Provider  Learn  more about [facility] in-office and virtual care options: [facility] - Get Care Now

## 2024-07-30 ENCOUNTER — Telehealth: Payer: Self-pay | Admitting: Obstetrics and Gynecology

## 2024-07-30 NOTE — Telephone Encounter (Signed)
 Error

## 2024-07-31 ENCOUNTER — Other Ambulatory Visit: Payer: Self-pay

## 2024-07-31 ENCOUNTER — Encounter: Payer: Self-pay | Admitting: Family Medicine

## 2024-07-31 ENCOUNTER — Ambulatory Visit (INDEPENDENT_AMBULATORY_CARE_PROVIDER_SITE_OTHER)

## 2024-07-31 VITALS — BP 163/113 | HR 79 | Wt 184.0 lb

## 2024-07-31 DIAGNOSIS — N949 Unspecified condition associated with female genital organs and menstrual cycle: Secondary | ICD-10-CM

## 2024-07-31 DIAGNOSIS — Z1331 Encounter for screening for depression: Secondary | ICD-10-CM

## 2024-07-31 NOTE — Progress Notes (Signed)
   GYNECOLOGY OFFICE VISIT NOTE  History:   Brittany Bowers is a 28 y.o. [phone] here today for labia lesions.  - in b/l inguinal folds - h/o similar x2 after birth of each of her children, squeezed or popped, output is like a blackhead - reappeared 2d ago, noticed after shaving, painful only after squeezing it - swollen/hard, no blisters or pain/itching - denies change in vaginal discharge, itching, other internal sx   Physical Exam:  BP (!) 163/113   Pulse 79   Wt 184 lb (83.5 kg)   BMI 33.65 kg/m  CONSTITUTIONAL: Well-developed, well-nourished female in no acute distress.  CV: regular rate RESPIRATORY: Effort normal PELVIC: Normal appearing external genitalia. Several pale papules noted on b/l labia margin, nontender. One open lesion on medial surface of L labia in the anterior 1/3. HSV swab collected here.  Assessment and Plan:  1. Genital lesion, female (Primary) Ddx: HSV, folliculitis. Suspect folliculitis r/t shaving based on history. Less likely to be HSV based on non-veiscular lesion and absence of pain. Patient extremely worried about etiology. - Herpes simplex virus culture  2. Positive depression screening PHQ9= 12, GAD&= 10. Patient tearful and worried in the room related to the above issue. Referral placed to [facility]. - Ambulatory referral to Integrated Behavioral Health   No follow-ups on file.

## 2024-08-01 DIAGNOSIS — N949 Unspecified condition associated with female genital organs and menstrual cycle: Secondary | ICD-10-CM | POA: Diagnosis not present

## 2024-08-03 LAB — HERPES SIMPLEX VIRUS CULTURE

## 2024-08-04 ENCOUNTER — Ambulatory Visit: Payer: Self-pay

## 2024-08-08 NOTE — BH Specialist Note (Signed)
 Integrated Behavioral Health via Telemedicine Visit  08/13/2024 BRISA AUTH [id number]  Number of Integrated Behavioral Health Clinician visits: 1- Initial Visit  Session Start time: 0815   Session End time: 0840  Total time in minutes: 25   Referring Provider: Barabara Maier, DO Patient/Family location: [facility] Provider location: Center for [facility] at [facility]  All persons participating in visit:  Brittany Bowers and [facility] Maksym Bowers   Types of Service: Individual psychotherapy and Video visit  I connected with Brittany Bowers and/or Brittany Bowers's n/a via  Telephone or Video Enabled Telemedicine Application  (Video is Caregility application) and verified that I am speaking with the correct person using two identifiers. Discussed confidentiality: Yes   I discussed the limitations of telemedicine and the availability of in person appointments.  Discussed there is a possibility of technology failure and discussed alternative modes of communication if that failure occurs.  I discussed that engaging in this telemedicine visit, they consent to the provision of behavioral healthcare and the services will be billed under their insurance.  Patient and/or legal guardian expressed understanding and consented to Telemedicine visit: Yes   Presenting Concerns: Patient and/or family reports the following symptoms/concerns: Anxiety with panic attacks, depression; anxiety increases with loud noises and stress; has been coping with anxious thoughts and panic by using breathing exercises; requesting medication. Duration of problem: Ongoing; Severity of problem: severe  Patient and/or Family's Strengths/Protective Factors: Concrete supports in place (healthy food, safe environments, etc.) and Sense of purpose  Goals Addressed: Patient will:  Reduce symptoms of: anxiety and depression   Increase knowledge and/or ability of: healthy habits    Demonstrate ability to: Increase healthy adjustment to current life circumstances and Increase motivation to adhere to plan of care  Progress towards Goals: Ongoing  Interventions: Interventions utilized:  Motivational Interviewing, Psychoeducation and/or Health Education, and Supportive Reflection Standardized Assessments completed: Not Needed    Patient and/or Family Response: Patient agrees with treatment plan.   Clinical Assessment/Diagnosis  Generalized anxiety disorder with panic attacks.   Patient may benefit from psychoeducation and brief therapeutic interventions regarding coping with symptoms of anxiety with panic and depression.  Plan: Follow up with behavioral health clinician on : Two weeks Behavioral recommendations:  -Continue using daily self-coping strategies (relaxation breathing exercises; limiting loud noises if able) -Begin prioritizing healthy self-care (regular meals, adequate rest; allowing practical help from supportive friends and family, as needed  Referral(s): Integrated [employer] (In Clinic)  I discussed the assessment and treatment plan with the patient and/or parent/guardian. They were provided an opportunity to ask questions and all were answered. They agreed with the plan and demonstrated an understanding of the instructions.   They were advised to call back or seek an in-person evaluation if the symptoms worsen or if the condition fails to improve as anticipated.  Warren JAYSON Mering, LCSW     08/02/2024    3:49 PM 06/02/2023   10:04 AM 08/28/2018    4:58 PM 04/27/2018    4:42 PM 04/14/2018    4:38 PM  Depression screen PHQ 2/9  Decreased Interest 1 1 0 1 1  Down, Depressed, Hopeless 2 0 0 1 2  PHQ - 2 Score 3 1 0 2 3  Altered sleeping 0 2 1 1 1   Tired, decreased energy 1 2 0 1 1  Change in appetite 1 0 0 1 2  Feeling bad or failure about yourself  1 0 0 0  0  Trouble concentrating 2 0 0 1 1  Moving slowly or  fidgety/restless 2 0 0 0 0  Suicidal thoughts 0 0 0 0 0  PHQ-9 Score 10 5 1 6 8       08/02/2024    3:49 PM 06/02/2023   10:04 AM 08/28/2018    4:58 PM 04/27/2018    4:42 PM  GAD 7 : Generalized Anxiety Score  Nervous, Anxious, on Edge 3 2 1 1   Control/stop worrying 3 2 0 1  Worry too much - different things 3 2 1 1   Trouble relaxing 3 2 1 1   Restless 3 1 0   Easily annoyed or irritable 3 2 0 1  Afraid - awful might happen 3 1 0 1  Total GAD 7 Score 21 12 3

## 2024-08-13 ENCOUNTER — Ambulatory Visit: Payer: Self-pay | Admitting: Clinical

## 2024-08-13 DIAGNOSIS — F411 Generalized anxiety disorder: Secondary | ICD-10-CM | POA: Diagnosis not present

## 2024-08-13 DIAGNOSIS — F410 Panic disorder [episodic paroxysmal anxiety] without agoraphobia: Secondary | ICD-10-CM

## 2024-08-13 NOTE — Patient Instructions (Signed)
 Center for [facility] Healthcare at [facility] [address] [place], [place] [address] [phone] (main office) [phone] (Adalina Dopson's office)

## 2024-08-15 ENCOUNTER — Other Ambulatory Visit: Payer: Self-pay

## 2024-08-15 ENCOUNTER — Ambulatory Visit (HOSPITAL_COMMUNITY): Admission: EM | Admit: 2024-08-15 | Discharge: 2024-08-16 | Attending: Psychiatry | Admitting: Psychiatry

## 2024-08-15 DIAGNOSIS — Z79899 Other long term (current) drug therapy: Secondary | ICD-10-CM | POA: Insufficient documentation

## 2024-08-15 DIAGNOSIS — I493 Ventricular premature depolarization: Secondary | ICD-10-CM | POA: Insufficient documentation

## 2024-08-15 DIAGNOSIS — R45850 Homicidal ideations: Secondary | ICD-10-CM | POA: Insufficient documentation

## 2024-08-15 DIAGNOSIS — R45851 Suicidal ideations: Secondary | ICD-10-CM | POA: Insufficient documentation

## 2024-08-15 DIAGNOSIS — F411 Generalized anxiety disorder: Secondary | ICD-10-CM | POA: Insufficient documentation

## 2024-08-15 DIAGNOSIS — Z046 Encounter for general psychiatric examination, requested by authority: Secondary | ICD-10-CM | POA: Diagnosis not present

## 2024-08-15 DIAGNOSIS — I491 Atrial premature depolarization: Secondary | ICD-10-CM | POA: Insufficient documentation

## 2024-08-15 DIAGNOSIS — F1210 Cannabis abuse, uncomplicated: Secondary | ICD-10-CM | POA: Insufficient documentation

## 2024-08-15 LAB — CBC WITH DIFFERENTIAL/PLATELET
Basophils Absolute: 0.4 K/uL — ABNORMAL HIGH (ref 0.0–0.1)
Basophils Relative: 3 %
Eosinophils Absolute: 0 K/uL (ref 0.0–0.5)
Eosinophils Relative: 0 %
HCT: 42.5 % (ref 36.0–46.0)
Hemoglobin: 14 g/dL (ref 12.0–15.0)
Lymphocytes Relative: 40 %
Lymphs Abs: 4.7 K/uL — ABNORMAL HIGH (ref 0.7–4.0)
MCH: 29.5 pg (ref 26.0–34.0)
MCHC: 32.9 g/dL (ref 30.0–36.0)
MCV: 89.7 fL (ref 80.0–100.0)
Monocytes Absolute: 0.6 K/uL (ref 0.1–1.0)
Monocytes Relative: 5 %
Neutro Abs: 6.1 K/uL (ref 1.7–7.7)
Neutrophils Relative %: 52 %
Platelets: 353 K/uL (ref 150–400)
RBC: 4.74 MIL/uL (ref 3.87–5.11)
RDW: 12.8 % (ref 11.5–15.5)
WBC: 11.8 K/uL — ABNORMAL HIGH (ref 4.0–10.5)
nRBC: 0 % (ref 0.0–0.2)

## 2024-08-15 LAB — COMPREHENSIVE METABOLIC PANEL WITH GFR
ALT: 18 U/L (ref 0–44)
AST: 21 U/L (ref 15–41)
Albumin: 4.5 g/dL (ref 3.5–5.0)
Alkaline Phosphatase: 54 U/L (ref 38–126)
Anion gap: 11 (ref 5–15)
BUN: 12 mg/dL (ref 6–20)
CO2: 22 mmol/L (ref 22–32)
Calcium: 9.8 mg/dL (ref 8.9–10.3)
Chloride: 104 mmol/L (ref 98–111)
Creatinine, Ser: 0.6 mg/dL (ref 0.44–1.00)
GFR, Estimated: >60 mL/min (ref >60)
Glucose, Bld: 116 mg/dL — ABNORMAL HIGH (ref 70–99)
Potassium: 3.7 mmol/L (ref 3.5–5.1)
Sodium: 137 mmol/L (ref 135–145)
Total Bilirubin: 0.5 mg/dL (ref 0.0–1.2)
Total Protein: 7.8 g/dL (ref 6.5–8.1)

## 2024-08-15 LAB — POCT URINE DRUG SCREEN - MANUAL ENTRY (I-SCREEN)
POC Amphetamine UR: NOT DETECTED
POC Buprenorphine (BUP): NOT DETECTED
POC Cocaine UR: NOT DETECTED
POC Marijuana UR: POSITIVE — AB
POC Methadone UR: NOT DETECTED
POC Methamphetamine UR: NOT DETECTED
POC Morphine: NOT DETECTED
POC Oxazepam (BZO): NOT DETECTED
POC Oxycodone UR: NOT DETECTED
POC Secobarbital (BAR): NOT DETECTED

## 2024-08-15 LAB — POC URINE PREG, ED: Preg Test, Ur: NEGATIVE

## 2024-08-15 LAB — ETHANOL: Alcohol, Ethyl (B): <15 mg/dL (ref <15)

## 2024-08-15 LAB — TSH: TSH: 3.176 u[IU]/mL (ref 0.350–4.500)

## 2024-08-15 MED ORDER — MAGNESIUM HYDROXIDE 400 MG/5ML PO SUSP: 30.0000 mL | Freq: Every day | ORAL | Status: DC | PRN

## 2024-08-15 MED ORDER — ACETAMINOPHEN 325 MG PO TABS
650.0000 mg | ORAL_TABLET | Freq: Four times a day (QID) | ORAL | Status: DC | PRN
  Administered 2024-08-15 – 2024-08-16 (×2): 650 mg via ORAL
  Filled 2024-08-15 (×2): qty 2

## 2024-08-15 MED ORDER — ACETAMINOPHEN 325 MG PO TABS: 650.0000 mg | ORAL_TABLET | Freq: Once | ORAL | Status: DC

## 2024-08-15 MED ORDER — OLANZAPINE 10 MG IM SOLR: 10.0000 mg | Freq: Three times a day (TID) | INTRAMUSCULAR | Status: DC | PRN

## 2024-08-15 MED ORDER — HALOPERIDOL LACTATE 5 MG/ML IJ SOLN: 5.0000 mg | Freq: Three times a day (TID) | INTRAMUSCULAR | Status: DC | PRN

## 2024-08-15 MED ORDER — ALUM & MAG HYDROXIDE-SIMETH 200-200-20 MG/5ML PO SUSP: 30.0000 mL | ORAL | Status: DC | PRN

## 2024-08-15 MED ORDER — CLONIDINE HCL 0.1 MG PO TABS: 0.1000 mg | ORAL_TABLET | Freq: Once | ORAL | Status: DC

## 2024-08-15 MED ORDER — HALOPERIDOL 5 MG PO TABS: 5.0000 mg | ORAL_TABLET | Freq: Three times a day (TID) | ORAL | Status: DC | PRN

## 2024-08-15 MED ORDER — DIPHENHYDRAMINE HCL 50 MG/ML IJ SOLN: 50.0000 mg | Freq: Three times a day (TID) | INTRAMUSCULAR | Status: DC | PRN

## 2024-08-15 MED ORDER — DIPHENHYDRAMINE HCL 50 MG PO CAPS: 50.0000 mg | ORAL_CAPSULE | Freq: Three times a day (TID) | ORAL | Status: DC | PRN

## 2024-08-15 MED ORDER — LORAZEPAM 2 MG/ML IJ SOLN: 2.0000 mg | Freq: Three times a day (TID) | INTRAMUSCULAR | Status: DC | PRN

## 2024-08-15 NOTE — ED Provider Notes (Signed)
 [facility] Continuous Assessment Admission H&P  Date: 08/15/24 Patient Name: Brittany Bowers MRN: [id number] Chief Complaint: suicidal ideation/behavior concern  Diagnoses:  Final diagnoses:  Involuntary commitment  Suicidal ideation  Marijuana abuse    HPI: Brittany Bowers, 28 y/o female with a history of GAD, presented to [facility] via [facility] under IVC.  Per the IVC respondents has been diagnosed with general anxiety.  [name] has been's prescribed Zoloft.  [name] is abusing marijuana daily.  Respondents appear to be suicidal.  [name] stated I am just tired but no one can take care of my kids like I can so I will take them out to.  [name] has a firearm.  [name] is a danger to self respondents is a danger to others.  Per the patient she saw her psychiatrist on Monday via telehealth medicine and is prescribed Zoloft.  Patient currently lives with her kids.  According to her she is employed.  At this present moment patient does not seem to be in any acute distress.  A face-to-face observation of patient, patient is alert and oriented x 4, speech is clear, maintain eye contact.  Patient is very elusive in her conversation jumping ahead even before asking a question.  Patient right away trying to justify that she does not need to be here she is not suicidal she got a go home she got to go to work tomorrow.  When asked if she was having suicidal thoughts patient stated no but then she stated that she called her sister today and they were talking on the phone and she told the sister I just trying to prevent myself from committing suicide.  Denies HI, AVH or paranoia.  Patient denies smoking marijuana daily but states she smoked yesterday.  Reports drinking alcohol on the weekends.  Patient does appear to be in flight or flight to state of mind.  Patient does not seem to be forthcoming with information and does appear to be a threat to herself and possibly to others.  Patient stated that  she owns a gun and she gave it to her friend to keep.  Given IVC and patient presentation.  Patient is recommended for inpatient admissions.  IVC will be upheld.  Recommend inpatient admission  Total Time spent with patient: 30 minutes  Musculoskeletal  Strength & Muscle Tone: within normal limits Gait & Station: normal Patient leans: N/A  Psychiatric Specialty Exam  Presentation General Appearance:  Casual  Eye Contact: Good  Speech: Clear and Coherent  Speech Volume: Normal  Handedness: Right   Mood and Affect  Mood: Anxious; Depressed  Affect: Congruent   Thought Process  Thought Processes: Coherent  Descriptions of Associations:Intact  Orientation:Full (Time, Place and Person)  Thought Content:Scattered  Diagnosis of Schizophrenia or Schizoaffective disorder in past: No   Hallucinations:Hallucinations: None  Ideas of Reference:None  Suicidal Thoughts:Suicidal Thoughts: Yes, Passive SI Passive Intent and/or Plan: With Intent; With Means to Carry Out  Homicidal Thoughts:Homicidal Thoughts: No   Sensorium  Memory: Immediate Fair  Judgment: Poor  Insight: Fair   Chartered certified accountant: Fair  Attention Span: Fair  Recall: [employer] of Knowledge: Fair  Language: Fair   Psychomotor Activity  Psychomotor Activity: Psychomotor Activity: Normal   Assets  Assets: Desire for Improvement; Resilience; Social Support   Sleep  Sleep: Sleep: Fair Number of Hours of Sleep: 6   Nutritional Assessment (For OBS and [facility] admissions only) Has the patient had a weight loss or gain of 10  pounds or more in the last 3 months?: No Has the patient had a decrease in food intake/or appetite?: No Does the patient have dental problems?: No Does the patient have eating habits or behaviors that may be indicators of an eating disorder including binging or inducing vomiting?: No Has the patient recently lost weight without  trying?: 0 Has the patient been eating poorly because of a decreased appetite?: 0 Malnutrition Screening Tool Score: 0    Physical Exam HENT:     Head: Normocephalic.     Nose: Nose normal.  Eyes:     Pupils: Pupils are equal, round, and reactive to light.  Cardiovascular:     Rate and Rhythm: Normal rate.  Pulmonary:     Effort: Pulmonary effort is normal.  Musculoskeletal:        General: Normal range of motion.     Cervical back: Normal range of motion.  Neurological:     General: No focal deficit present.     Mental Status: She is alert.  Psychiatric:        Mood and Affect: Mood normal.        Behavior: Behavior normal.        Thought Content: Thought content normal.        Judgment: Judgment normal.    Review of Systems  Constitutional: Negative.   HENT: Negative.    Eyes: Negative.   Respiratory: Negative.    Cardiovascular: Negative.   Gastrointestinal: Negative.   Genitourinary: Negative.   Musculoskeletal: Negative.   Skin: Negative.   Neurological: Negative.   Psychiatric/Behavioral:  Positive for depression, substance abuse and suicidal ideas. The patient is nervous/anxious.     Blood pressure (!) 162/121, pulse 82, temperature 99.1 F (37.3 C), temperature source Oral, resp. rate 20, SpO2 100%. There is no height or weight on file to calculate BMI.  Past Psychiatric History: GAD   Is the patient at risk to self? Yes  Has the patient been a risk to self in the past 6 months? No .    Has the patient been a risk to self within the distant past? No   Is the patient a risk to others? No   Has the patient been a risk to others in the past 6 months? No   Has the patient been a risk to others within the distant past? No   Past Medical History: see chart   Family History: unknown   Social History: Marijuana/alcohol  Last Labs:  Admission on 08/15/2024  Component Date Value Ref Range Status   Preg Test, Ur 08/15/2024 Negative  Negative Final   POC  Amphetamine UR 08/15/2024 None Detected  NONE DETECTED (Cut Off Level 1000 ng/mL) Final   POC Secobarbital (BAR) 08/15/2024 None Detected  NONE DETECTED (Cut Off Level 300 ng/mL) Final   POC Buprenorphine (BUP) 08/15/2024 None Detected  NONE DETECTED (Cut Off Level 10 ng/mL) Final   POC Oxazepam (BZO) 08/15/2024 None Detected  NONE DETECTED (Cut Off Level 300 ng/mL) Final   POC Cocaine UR 08/15/2024 None Detected  NONE DETECTED (Cut Off Level 300 ng/mL) Final   POC Methamphetamine UR 08/15/2024 None Detected  NONE DETECTED (Cut Off Level 1000 ng/mL) Final   POC Morphine 08/15/2024 None Detected  NONE DETECTED (Cut Off Level 300 ng/mL) Final   POC Methadone UR 08/15/2024 None Detected  NONE DETECTED (Cut Off Level 300 ng/mL) Final   POC Oxycodone  UR 08/15/2024 None Detected  NONE DETECTED (Cut Off Level 100 ng/mL)  Final   POC Marijuana UR 08/15/2024 Positive (A)  NONE DETECTED (Cut Off Level 50 ng/mL) Final  Office Visit on 07/31/2024  Component Date Value Ref Range Status   HSV Culture/Type 08/01/2024 Comment   Final   Comment: Negative No Herpes simplex virus isolated.     Allergies: Bee venom, Onion, and Pork-derived products  Medications:  Facility Ordered Medications  Medication   enalapril  (VASOTEC ) tablet 5 mg   acetaminophen  (TYLENOL ) tablet 650 mg   alum & mag hydroxide-simeth (MAALOX/MYLANTA) 200-200-20 MG/5ML suspension 30 mL   magnesium hydroxide (MILK OF MAGNESIA) suspension 30 mL   haloperidol (HALDOL) tablet 5 mg   And   diphenhydrAMINE  (BENADRYL ) capsule 50 mg   haloperidol lactate (HALDOL) injection 5 mg   And   diphenhydrAMINE  (BENADRYL ) injection 50 mg   And   LORazepam  (ATIVAN ) injection 2 mg   OLANZapine (ZYPREXA) injection 10 mg   PTA Medications  Medication Sig   beclomethasone (QVAR) 40 MCG/ACT inhaler Inhale 2 puffs into the lungs 2 (two) times daily as needed (asthma). (Patient not taking: Reported on 07/31/2024)   diphenhydrAMINE  (BENADRYL ) 25 MG  tablet Take 1 tablet (25 mg total) by mouth every 6 (six) hours as needed.      Medical Decision Making  Observation unit recommending inpatient admission    Recommendations  Based on my evaluation the patient does not appear to have an emergency medical condition.  Gaither Pouch, NP 08/15/24  10:07 PM

## 2024-08-15 NOTE — BH Assessment (Signed)
 Comprehensive Clinical Assessment (CCA) Note  08/15/2024 Brittany Bowers [id number]  Disposition: Gaither Pouch, NP recommends inpatient treatment.  [name], RN, [facility] to review if there are no available beds CSW to seek placement.   The patient demonstrates the following risk factors for suicide: Chronic risk factors for suicide include: psychiatric disorder of Major Depressive Disorder, single, severe. Acute risk factors for suicide include: Pt made suicidal and homicidal comments to [facility] team. Protective factors for this patient include: positive therapeutic relationship. Considering these factors, the overall suicide risk at this point appears to be low. Patient is not appropriate for outpatient follow up.  Brittany Bowers is a 28 year old female who presents involuntary and unaccompanied to [facility] ([facility]). Clinician asked the pt, what brought you to the hospital? Pt reports, while washing the dishes she got a call from her sister to ask her a question. Pt reports, her sister heard her crying and continued to ask what was wrong. Pt reports, she continued to say nothing, she was okay. Pt reports, while crying she said: I'm trying not to commit suicide, I'm tired. Pt reports, the call ended, she finished washing the dishes, her daughter answered the door and told her it was the police. Pt reports, her sister hung up and called the police. Pt reports, she's a single mother, she had a parent/teacher conference about her childs behaviors and her childrens father is in jail. Pt denies, SI, HI, hallucinations, self-injurious behaviors and access to weapons.   Pt was IVCs by BHRT team. 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 [name] is a danger to self; [name] is a danger to others.   Pt denies, substance use. Pt's IUDS is positive for Marijuana. Pt reports, she was seeing a therapist six years ago after the birth of her son. Per chart, pt had a session with Warren Mering, LCSW on 08/13/2024, pt discussed her anxiety with panic attacks, depression, etc. Pt denies, previous inpatient admissions.   Pt presents restless, in lounge wear with normal speech and eye contact. During the assessment the pt reports she does not need to be here (at [facility]), she got up out the chair showed her arms (that she has no history of self-harm), she has to work tomorrow and that she needs to get home to be with her children (who's with her mother). Pt's mood was anxious, sad. Pt's affect is congruent. Pt's insight was fair. Pt's judgement was poor.   Chief Complaint:  Chief Complaint  Patient presents with   IVC   Visit Diagnosis: Major Depressive Disorder, single, severe                             Generalized Anxiety Disorder.    CCA Screening, Triage and Referral (STR)  Patient Reported Information How did you hear about us ? Legal System  What Is the Reason for Your Visit/Call Today? Pt presents to [facility] under IVC, accompanied by [employer] due to suicidal ideations. Pt reports experiencing high anxiety and stress related to caring for her children. Pt also reports feeling anxious about recent doctor appointment and test results. Pt admitted that she got into an argument  with her children's father and it triggered her. Pt reports calling her sister earlier this evening crying and stated  I'm tired, I'm just so tired, it's taking everything for me not to kill myself. Pt's sister called police as she was concerned about the comments that were made. Pt admits that she was feeling that way at the time, but denies plan/intent. Pt reports being prescribed Zoloft, but she has not taken it in a while. Per  IVC-[name] has been diagnosed with anxiety, respondent has been prescribed Zoloft; respondent is abusing marijuana daily. [name] appears to be suicidal. [name] stated I'm just tired, but no one can take care of my kids like I can, so I will take them out too. [name] has a firearm, respondent is a danger to self; respondent is a danger to others. Pt denies prior suicide attempts, self-injurious behaviors and psychiatric impatient hospitalizations. Pt currently denies SI, HI,AVH and alcohol use.  How Long Has This Been Causing You Problems? <Week  What Do You Feel Would Help You the Most Today? Treatment for Depression or other mood problem; Stress Management; Social Support   Have You Recently Had Any Thoughts About Hurting Yourself? Yes  Are You Planning to Commit Suicide/Harm Yourself At This time? No   Flowsheet Row ED from 08/15/2024 in [facility] ED from 10/13/2023 in [facility] Emergency Department at [facility]  C-SSRS RISK CATEGORY Low Risk No Risk    Have you Recently Had Thoughts About Hurting Someone Sherral? No  Are You Planning to Harm Someone at This Time? No  Explanation: NA   Have You Used Any Alcohol or Drugs in the Past 24 Hours? Yes  How Long Ago Did You Use Drugs or Alcohol? Pt denies, use.  What Did You Use and How Much? marijuana (last night)   Do You Currently Have a Therapist/Psychiatrist? No  Name of Therapist/Psychiatrist:    Have You Been Recently Discharged From Any Office Practice or Programs? No  Explanation of Discharge From Practice/Program: NA    CCA Screening Triage Referral Assessment Type of Contact: Face-to-Face  Telemedicine Service Delivery:   Is this Initial or Reassessment?   Date Telepsych consult ordered in CH[facility]:    Time Telepsych consult ordered in CHL:    Location of Assessment: [facility]  Provider Location: [facility] Assessment Services   Collateral  Involvement: None.   Does Patient Have a Automotive engineer Guardian? No  Legal Guardian Contact Information: NA  Copy of Legal Guardianship Form: -- (NA)  Legal Guardian Notified of Arrival: -- (NA)  Legal Guardian Notified of Pending Discharge: -- (NA)  If Minor and Not Living with Parent(s), Who has Custody? NA  Is CPS involved or ever been involved? Never  Is APS involved or ever been involved? Never   Patient Determined To Be At Risk for Harm To Self or Others Based on Review of Patient Reported Information or Presenting Complaint? Yes, for Self-Harm (Pt denies, SI and HI.)  Method: No Plan  Availability of Means: No access or NA  Intent: Vague intent or NA  Notification Required: No need or identified person  Additional Information for Danger to Others Potential: -- (NA)  Additional Comments for Danger to Others Potential: NA  Are There Guns or Other Weapons in Your Home? Yes  Types of Guns/Weapons: Yes.  Are These Weapons Safely Secured?  Yes  Who Could Verify You Are Able To Have These Secured: Pt reports, the police gave her friend her gun.  Do You Have any Outstanding Charges, Pending Court Dates, Parole/Probation? Pt denies.  Contacted To Inform of Risk of Harm To Self or Others: Other: Comment (NA)    Does Patient Present under Involuntary Commitment? Yes    [place] of Residence: [place]   Patient Currently Receiving the Following Services: Not Receiving Services   Determination of Need: Urgent (48 hours)   Options For Referral: Other: Comment; BH Urgent Care; Outpatient Therapy; Medication Management     CCA Biopsychosocial Patient Reported Schizophrenia/Schizoaffective Diagnosis in Past: No   Strengths: Pt denies, SI and strong supports.   Mental Health Symptoms Depression:  Tearfulness; Hopelessness (Sadness, feeling overwhelmed.)   Duration of Depressive symptoms: Duration of Depressive Symptoms:  N/A   Mania:  None   Anxiety:   Worrying; Restlessness; Tension   Psychosis:  None (Pt denies.)   Duration of Psychotic symptoms:    Trauma:  None (Pt denies.)   Obsessions:  None   Compulsions:  None   Inattention:  None   Hyperactivity/Impulsivity:  Feeling of restlessness   Oppositional/Defiant Behaviors:  None   Emotional Irregularity:  Potentially harmful impulsivity   Other Mood/Personality Symptoms:  NA    Mental Status Exam Appearance and self-care  Stature:  Average   Weight:  Average weight   Clothing:  -- (lounge wear.)   Grooming:  Normal   Cosmetic use:  None   Posture/gait:  Normal   Motor activity:  Not Remarkable   Sensorium  Attention:  Normal   Concentration:  Normal   Orientation:  X5   Recall/memory:  Normal   Affect and Mood  Affect:  Congruent   Mood:  Anxious (Sad.)   Relating  Eye contact:  Normal   Facial expression:  Responsive   Attitude toward examiner:  Cooperative   Thought and Language  Speech flow: Normal   Thought content:  Appropriate to Mood and Circumstances   Preoccupation:  None   Hallucinations:  None   Organization:  Coherent   [employer] of Knowledge:  Fair   Intelligence:  Average   Abstraction:  Normal   Judgement:  Poor   Reality Testing:  Adequate   Insight:  Fair   Decision Making:  Normal   Social Functioning  Social Maturity:  -- Industrial/product designer)   Social Judgement:  Normal   Stress  Stressors:  Other (Comment) (Pt reports, she's a single mother, she had a parent/teacher conference about her childs behaviors, her childrens father is in jail.)   Coping Ability:  Overwhelmed   Skill Deficits:  Communication   Supports:  Family; Friends/Service system     Religion: Religion/Spirituality Are You A Religious Person?: Yes What is Your Religious Affiliation?: Baptist How Might This Affect Treatment?: NA  Leisure/Recreation: Leisure / Recreation Do You Have  Hobbies?: Yes Leisure and Hobbies: Crocheting  Exercise/Diet: Exercise/Diet Do You Exercise?: Yes What Type of Exercise Do You Do?: Run/Walk How Many Times a Week Do You Exercise?: Daily Have You Gained or Lost A Significant Amount of Weight in the Past Six Months?: No Do You Follow a Special Diet?: No Do You Have Any Trouble Sleeping?: No   CCA Employment/Education Employment/Work Situation: Employment / Work Situation Employment Situation: Employed Work Stressors: Pt denies. Patient's Job has Been Impacted by Current Illness: No Has Patient ever Been in the [employer]?: No  Education: Education Is  Patient Currently Attending School?: No Last Grade Completed: 12 Did You Attend College?: Yes What Type of College Degree Do you Have?: Pt reports, she attended college but did not graduate. Did You Have An Individualized Education Program (IIEP): No Did You Have Any Difficulty At School?: No Patient's Education Has Been Impacted by Current Illness: No   CCA Family/Childhood History Family and Relationship History: Family history Marital status: Single Does patient have children?: Yes How many children?: 2 How is patient's relationship with their children?: Pt reports, she has two children (6 & 9).  Childhood History:  Childhood History By whom was/is the patient raised?: Mother Did patient suffer any verbal/emotional/physical/sexual abuse as a child?: No Did patient suffer from severe childhood neglect?: No Has patient ever been sexually abused/assaulted/raped as an adolescent or adult?: No Was the patient ever a victim of a crime or a disaster?: No Witnessed domestic violence?: No Has patient been affected by domestic violence as an adult?: No   CCA Substance Use Alcohol/Drug Use: Alcohol / Drug Use Pain Medications: See MAR Prescriptions: See MAR Over the Counter: See MAR History of alcohol / drug use?: No history of alcohol / drug abuse Longest period of  sobriety (when/how long): NA Negative Consequences of Use:  (NA) Withdrawal Symptoms: None    ASAM's:  Six Dimensions of Multidimensional Assessment  Dimension 1:  Acute Intoxication and/or Withdrawal Potential:      Dimension 2:  Biomedical Conditions and Complications:      Dimension 3:  Emotional, Behavioral, or Cognitive Conditions and Complications:     Dimension 4:  Readiness to Change:     Dimension 5:  Relapse, Continued use, or Continued Problem Potential:     Dimension 6:  Recovery/Living Environment:     ASAM Severity Score:    ASAM Recommended Level of Treatment:     Substance use Disorder (SUD)    Recommendations for Services/Supports/Treatments: Recommendations for Services/Supports/Treatments Recommendations For Services/Supports/Treatments: Inpatient Hospitalization  Disposition Recommendation per psychiatric provider: We recommend inpatient psychiatric hospitalization when medically cleared. Patient is under voluntary admission status at this time; please IVC if attempts to leave hospital.   DSM5 Diagnoses: Patient Active Problem List   Diagnosis Date Noted   Poor fetal growth affecting management of mother in third trimester    Status post induced vaginal delivery for [facility] 10/31/2017   Anxiety 10/31/2017     Referrals to Alternative Service(s): Referred to Alternative Service(s):   Place:   Date:   Time:    Referred to Alternative Service(s):   Place:   Date:   Time:    Referred to Alternative Service(s):   Place:   Date:   Time:    Referred to Alternative Service(s):   Place:   Date:   Time:     Jackson JONETTA Broach, [facility] Comprehensive Clinical Assessment (CCA) Screening, Triage and Referral Note  08/15/2024 Brittany Bowers [id number]  Chief Complaint:  Chief Complaint  Patient presents with   IVC   Visit Diagnosis:   Patient Reported Information How did you hear about us ? Legal System  What Is the Reason for Your Visit/Call Today? Pt  presents to [facility] under IVC, accompanied by [employer] due to suicidal ideations. Pt reports experiencing high anxiety and stress related to caring for her children. Pt also reports feeling anxious about recent doctor appointment and test results. Pt admitted that she got into an argument with her children's father and it triggered her. Pt reports calling her sister earlier this evening  crying and stated  I'm tired, I'm just so tired, it's taking everything for me not to kill myself. Pt's sister called police as she was concerned about the comments that were made. Pt admits that she was feeling that way at the time, but denies plan/intent. Pt reports being prescribed Zoloft, but she has not taken it in a while. Per IVC-[name] has been diagnosed with anxiety, respondent has been prescribed Zoloft; respondent is abusing marijuana daily. [name] appears to be suicidal. [name] stated I'm just tired, but no one can take care of my kids like I can, so I will take them out too. [name] has a firearm, respondent is a danger to self; respondent is a danger to others. Pt denies prior suicide attempts, self-injurious behaviors and psychiatric impatient hospitalizations. Pt currently denies SI, HI,AVH and alcohol use.  How Long Has This Been Causing You Problems? <Week  What Do You Feel Would Help You the Most Today? Treatment for Depression or other mood problem; Stress Management; Social Support   Have You Recently Had Any Thoughts About Hurting Yourself? Yes  Are You Planning to Commit Suicide/Harm Yourself At This time? No   Have you Recently Had Thoughts About Hurting Someone Sherral? No  Are You Planning to Harm Someone at This Time? No  Explanation: NA   Have You Used Any Alcohol or Drugs in the Past 24 Hours? Yes  How Long Ago Did You Use Drugs or Alcohol? Pt denies, use.  What Did You Use and How Much? marijuana (last night)   Do You Currently Have a Therapist/Psychiatrist? No  Name  of Therapist/Psychiatrist: Per chart, pt is linked to Jamie McMannes, LCSW for therapy.  Have You Been Recently Discharged From Any Office Practice or Programs? No  Explanation of Discharge From Practice/Program: NA   CCA Screening Triage Referral Assessment Type of Contact: Face-to-Face  Telemedicine Service Delivery:   Is this Initial or Reassessment?   Date Telepsych consult ordered in CHL:    Time Telepsych consult ordered in CHL:    Location of Assessment: [facility]  Provider Location: [facility] Assessment Services    Collateral Involvement: None.   Does Patient Have a Automotive engineer Guardian? No. Name and Contact of Legal Guardian: NA If Minor and Not Living with Parent(s), Who has Custody? NA  Is CPS involved or ever been involved? Never  Is APS involved or ever been involved? Never   Patient Determined To Be At Risk for Harm To Self or Others Based on Review of Patient Reported Information or Presenting Complaint? Yes, for Self-Harm (Pt denies, SI and HI.)  Method: No Plan  Availability of Means: No access or NA  Intent: Vague intent or NA  Notification Required: No need or identified person  Additional Information for Danger to Others Potential: -- (NA)  Additional Comments for Danger to Others Potential: NA  Are There Guns or Other Weapons in Your Home? Yes  Types of Guns/Weapons: Yes.  Are These Weapons Safely Secured?                            Yes  Who Could Verify You Are Able To Have These Secured: Pt reports, the police gave her friend her gun.  Do You Have any Outstanding Charges, Pending Court Dates, Parole/Probation? Pt denies.  Contacted To Inform of Risk of Harm To Self or Others: Other: Comment (NA)   Does Patient Present under Involuntary Commitment?  Yes    [place] of Residence: [place]   Patient Currently Receiving the Following Services: Not Receiving Services   Determination of Need: Urgent (48  hours)   Options For Referral: Other: Comment; BH Urgent Care; Outpatient Therapy; Medication Management   Disposition Recommendation per psychiatric provider: We recommend inpatient psychiatric hospitalization when medically cleared. Patient is under voluntary admission status at this time; please IVC if attempts to leave hospital.  Jackson JONETTA Broach, LCMHC

## 2024-08-15 NOTE — Progress Notes (Signed)
   08/15/24 1938  [facility] Triage Screening (Walk-ins at [facility] only)  How Did You Hear About Us ? Legal System  What Is the Reason for Your Visit/Call Today? Pt presents to [facility] under IVC, accompanied by [employer] due to suicidal ideations. Pt reports experiencing high anxiety and stress related to caring for her children. Pt also reports feeling anxious about recent doctor appointment and test results. Pt admitted that she got into an argument with her children's father and it triggered her. Pt reports calling her sister earlier this evening crying and stated  I'm tired, I'm just so tired, it's taking everything for me not to kill myself. Pt's sister called police as she was concerned about the comments that were made. Pt admits that she was feeling that way at the time, but denies plan/intent. Pt reports being prescribed Zoloft, but she has not taken it in a while. Per IVC-[name] has been diagnosed with anxiety, respondent has been prescribed Zoloft; respondent is abusing marijuana daily. [name] appears to be suicidal. [name] stated I'm just tired, but no one can take care of my kids like I can, so I will take them out too. [name] has a firearm, respondent is a danger to self; respondent is a danger to others. Pt denies prior suicide attempts, self-injurious behaviors and psychiatric impatient hospitalizations. Pt currently denies SI, HI,AVH and alcohol use.  How Long Has This Been Causing You Problems? <Week  Have You Recently Had Any Thoughts About Hurting Yourself? Yes  How long ago did you have thoughts about hurting yourself? earlier today, denies at this moment  Are You Planning to Commit Suicide/Harm Yourself At This time? No  Have you Recently Had Thoughts About Hurting Someone Sherral? No  Are You Planning To Harm Someone At This Time? No  Explanation: NA  Physical Abuse Denies  Verbal Abuse Denies  Sexual Abuse Denies  Exploitation of patient/patient's resources Denies  Self-Neglect  Denies  Possible abuse reported to: Other (Comment) (NA)  Are you currently experiencing any auditory, visual or other hallucinations? No  Have You Used Any Alcohol or Drugs in the Past 24 Hours? Yes  What Did You Use and How Much? marijuana (last night)  Do you have any current medical co-morbidities that require immediate attention? No (elevated blood pressure)  Clinician description of patient physical appearance/behavior: anxious, cooperative, pleasant  What Do You Feel Would Help You the Most Today? Treatment for Depression or other mood problem;Stress Management;Social Support  If access to [facility] was not available, would you have sought care in the Emergency Department? No  Determination of Need Urgent (48 hours)  Options For Referral Other: Comment;BH Urgent Care;Outpatient Therapy;Medication Management  Determination of Need filed? Yes

## 2024-08-15 NOTE — BH Assessment (Signed)
 Clinician called [facility] Department of Social Services, after  hours number [phone]) to make a Child Protective Services report.   Clinician provider her name and contact information.    Jackson JONETTA Broach, MS, [facility] Triage Specialist [phone]

## 2024-08-15 NOTE — ED Notes (Signed)
 Pt under IVC, presents with suicidal and homicidal thoughts .  Pt stated,  I'm just tired, but no on e can take care of my kids like I can, so I will take them out too.  Pt very anxious and irritable, stating she doesn't want to be here.  Vomited, small amt of mucous x 1, comfort measures given.  Tylenol  given for  headache.  Monitoring for safety.

## 2024-08-15 NOTE — ED Notes (Signed)
 Pt vomited x 1, mucus returns.  Refusing other meds, consenting to only Tylenol .

## 2024-08-15 NOTE — Progress Notes (Signed)
 Pt has been accepted to [facility] on 08/15/2024 . Bed assignment: TBD   Pt meets inpatient criteria per Gaither Pouch, NP   Attending Physician will be  Dr. Prentis  Report can be called to: Adult unit: [phone]  Pt can arrive pending discharges   Care Team Notified: [address] [facility] [name] Sprang, RN, Krista Gails, RN

## 2024-08-15 NOTE — BH Assessment (Signed)
 Clinician received a call back from a social worker at [facility] of Social Services to take information for the CPS report. Clinician and social worker reviewed the information shared for accuracy and answered all additional questions.    Brittany JONETTA Broach, MS, [facility], [facility] Triage Specialist [phone]

## 2024-08-16 ENCOUNTER — Inpatient Hospital Stay (HOSPITAL_COMMUNITY)
Admission: AD | Admit: 2024-08-16 | Discharge: 2024-08-17 | DRG: 880 | Disposition: A | Discharge status: Home / Self Care | Source: Intra-hospital

## 2024-08-16 ENCOUNTER — Encounter (HOSPITAL_COMMUNITY): Payer: Self-pay

## 2024-08-16 DIAGNOSIS — Z91014 Allergy to mammalian meats: Secondary | ICD-10-CM | POA: Diagnosis not present

## 2024-08-16 DIAGNOSIS — Z833 Family history of diabetes mellitus: Secondary | ICD-10-CM | POA: Diagnosis not present

## 2024-08-16 DIAGNOSIS — Z809 Family history of malignant neoplasm, unspecified: Secondary | ICD-10-CM | POA: Diagnosis not present

## 2024-08-16 DIAGNOSIS — R45851 Suicidal ideations: Secondary | ICD-10-CM | POA: Diagnosis present

## 2024-08-16 DIAGNOSIS — F419 Anxiety disorder, unspecified: Secondary | ICD-10-CM | POA: Diagnosis not present

## 2024-08-16 DIAGNOSIS — Z823 Family history of stroke: Secondary | ICD-10-CM | POA: Diagnosis not present

## 2024-08-16 DIAGNOSIS — F32A Depression, unspecified: Principal | ICD-10-CM | POA: Diagnosis present

## 2024-08-16 DIAGNOSIS — Z87891 Personal history of nicotine dependence: Secondary | ICD-10-CM

## 2024-08-16 DIAGNOSIS — F411 Generalized anxiety disorder: Principal | ICD-10-CM | POA: Diagnosis present

## 2024-08-16 DIAGNOSIS — J45909 Unspecified asthma, uncomplicated: Secondary | ICD-10-CM | POA: Diagnosis not present

## 2024-08-16 DIAGNOSIS — Z91030 Bee allergy status: Secondary | ICD-10-CM

## 2024-08-16 DIAGNOSIS — Z91018 Allergy to other foods: Secondary | ICD-10-CM | POA: Diagnosis not present

## 2024-08-16 DIAGNOSIS — Z79899 Other long term (current) drug therapy: Secondary | ICD-10-CM

## 2024-08-16 MED ORDER — ALUM & MAG HYDROXIDE-SIMETH 200-200-20 MG/5ML PO SUSP: 30.0000 mL | ORAL | Status: DC | PRN

## 2024-08-16 MED ORDER — HALOPERIDOL 5 MG PO TABS: 5.0000 mg | ORAL_TABLET | Freq: Three times a day (TID) | ORAL | Status: DC | PRN

## 2024-08-16 MED ORDER — MELATONIN 5 MG PO TABS
10.0000 mg | ORAL_TABLET | Freq: Every day | ORAL | Status: DC
  Filled 2024-08-16: qty 2

## 2024-08-16 MED ORDER — MAGNESIUM HYDROXIDE 400 MG/5ML PO SUSP: 30.0000 mL | Freq: Every day | ORAL | Status: DC | PRN

## 2024-08-16 MED ORDER — DIPHENHYDRAMINE HCL 25 MG PO CAPS: 50.0000 mg | ORAL_CAPSULE | Freq: Three times a day (TID) | ORAL | Status: DC | PRN

## 2024-08-16 MED ORDER — CLONIDINE HCL 0.1 MG PO TABS
0.1000 mg | ORAL_TABLET | Freq: Once | ORAL | Status: AC
  Administered 2024-08-16: 0.1 mg via ORAL
  Filled 2024-08-16: qty 1

## 2024-08-16 MED ORDER — ACETAMINOPHEN 325 MG PO TABS
650.0000 mg | ORAL_TABLET | Freq: Four times a day (QID) | ORAL | Status: DC | PRN
  Administered 2024-08-16: 650 mg via ORAL
  Filled 2024-08-16: qty 2

## 2024-08-16 MED ORDER — MELATONIN 5 MG PO TABS
10.0000 mg | ORAL_TABLET | Freq: Every day | ORAL | Status: DC
  Administered 2024-08-16: 10 mg via ORAL

## 2024-08-16 MED ORDER — OLANZAPINE 10 MG IM SOLR: 10.0000 mg | Freq: Three times a day (TID) | INTRAMUSCULAR | Status: DC | PRN

## 2024-08-16 MED ORDER — HYDROXYZINE HCL 10 MG PO TABS
10.0000 mg | ORAL_TABLET | Freq: Three times a day (TID) | ORAL | Status: DC | PRN
  Administered 2024-08-16: 10 mg via ORAL
  Filled 2024-08-16: qty 1

## 2024-08-16 MED ORDER — TRAZODONE HCL 50 MG PO TABS
50.0000 mg | ORAL_TABLET | Freq: Every evening | ORAL | Status: DC | PRN
  Administered 2024-08-16: 50 mg via ORAL
  Filled 2024-08-16: qty 1

## 2024-08-16 MED ORDER — OLANZAPINE 10 MG IM SOLR: 5.0000 mg | Freq: Three times a day (TID) | INTRAMUSCULAR | Status: DC | PRN

## 2024-08-16 NOTE — ED Notes (Signed)
 Pt observed/assessed in recliner sleeping. RR even and unlabored, appearing in no noted distress. Environmental check complete, will continue to monitor for safety

## 2024-08-16 NOTE — Tx Team (Signed)
 Initial Treatment Plan 08/16/2024 2:14 PM Brittany Bowers [id number]    PATIENT STRESSORS: Marital or family conflict     PATIENT STRENGTHS: Motivation for treatment/growth    PATIENT IDENTIFIED PROBLEMS:   Anxiety    I don't need to be here    SI and HI towards children           DISCHARGE CRITERIA:  Improved stabilization in mood, thinking, and/or behavior  PRELIMINARY DISCHARGE PLAN: Outpatient therapy  PATIENT/FAMILY INVOLVEMENT: This treatment plan has been presented to and reviewed with the patient, Brittany Bowers. The patient has been given the opportunity to ask questions and make suggestions.  Brittany Bowers Charl Wellen, RN 08/16/2024, 2:14 PM

## 2024-08-16 NOTE — Progress Notes (Addendum)
 Patient ID: Brittany Bowers, female   DOB: 1996-05-18, 28 y.o.   MRN: [id number]  [name], RN, 08/16/24, Time of arrival: 1:30pm  Patient is a new admit to unit. Patient is involuntary. Pt admitted due to a concern for suicidal/homicidal ideations, reporting I am just tired but no one can take care of my kids like I do so I will take them out too. Patient belongings addressed and stored in locker #25. Skin check performed with two staff members, results unremarkable. Elevated BP r/t anxious/ agitated presentation, no signs or symptoms at this time of elevated BP. Vital signs otherwise unremarkable. Pt is adamant that she does not need to be here and was tearful/ inconsolable throughout the admission. Patient engaged with assessment with encouragement. Patient orientated to facility, unit and room. All questions and concerns addressed at this time.  Patient stated reason for admission/reason for being here: I don't need to be here. I have never had SI/HI/AVH in my life I just said the wrong thing and somehow ended up here. I do not need to be here I need to be at home with my children. Pt continues to repeat I just want to go home throughout admission.   Patient current presentation remarkable for: Pt is tearful and inconsolable during admission. Pt preoccupied with wanting to go home, continuing to report she does not need to be here. Pt cooperative during admission.   Patient history and collateral remarkable for: Pt reports recreational alcohol use and denies tobacco use. Pt reports that she uses marijuana daily. Pt has a elevated BP, no physical symptoms at this time.
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
## 2024-08-16 NOTE — ED Notes (Addendum)
 Pt is awake, alert and oriented with flat affect. Pt did not voice any complaints of pain or discomfort. No signs of acute distress noted. Pt denies current SI/HI/AVH, plan or intent. Pt was advised to notify staff when having intrusive thoughts of hurting self or others. Pt verbalized understanding. Staff will monitor for pt's safety.

## 2024-08-16 NOTE — ED Provider Notes (Signed)
 FBC/OBS ASAP Discharge Summary  Date and Time: 08/16/2024 11:03 AM  Name: Brittany Bowers  MRN:  [id number]   Discharge Diagnoses:  Final diagnoses:  Involuntary commitment  Suicidal ideation  Marijuana abuse    Subjective:   Brittany Bowers, 28 y/o female with a history of GAD, presented to [facility] via [facility] under IVC.  Per the IVC respondents has been diagnosed with general anxiety. At that time, their was concern for suicidal statements and she was considered a danger to herself and others. [name] stated I am just tired but no one can take care of my kids like I can so I will take them out to.  On assessment this morning, patient reports that she is anxious.  Patient reports did not sleep well with her sister and other family members throughout the night and this morning. Patient reports that statements that she made yesterday were made with in frustration. She denies any suicidal ideations, homicidal ideations or auditory visual hallucinations.  Patient denies a prior history of suicidal thoughts, or active intent.  Patient reports that she was overwhelmed yesterday after parent-teacher conference about child's grades.  Patient reports that her sister called yesterday and she was doing her dishes.  After she made the statements, she reports that the police were at her door and asking her about the statements that she made to her sister.  Patient reports a prior history of anxiety and has not been on any medications before.  She considered getting on Zoloft previously approximately 6 years ago after she noticed her anxiety was worsening.  She had a telemedicine appointment recently and was considering starting on antianxiety medication to help with symptoms.  Patient reports that she has been in contact with her family throughout her time in the ops unit and her kids are with her mom.  Patient states that she had a gun, however when the police came by the house the gun was given to her friend  or sister who is a Engineer, drilling.  Patient is amenable with provider speaking with family members.   Patient has a strong desire to go back home, to be with kids and to continue working. Discussed with the patient the next up going forward given the IVC that was taken out.  Discussed with the patient the severity of concern and that she will be reassessed at [facility].  Stay Summary:   Patient was appropriate and cooperative while on the observation unit.  She did not require any agitation protocol.  Patient is medication nave and no antidepressant/antianxiety medication was started.  She was placed under IVC due to concerning suicidal/homicidal ideations.  Patient denied any active plan.  States that she was just overwhelmed and said things and frustration to her sister.  She was transferred to [facility] for continued mood stabilization and medication management.  Total Time spent with patient:  Total Time Spent in Direct Patient Care:  I personally spent  30 minutes minutes on the unit in direct patient care. The direct patient care time included face-to-face time with the patient, reviewing the patient's chart, communicating with other professionals, and coordinating care. Greater than 50% of this time was spent in counseling or coordinating care with the patient regarding goals of hospitalization, psycho-education, and discharge planning needs.  Past Psychiatric History:  Anxiety per patient   Past Medical History: None disclosed  Family History: None disclosed Family Psychiatric History: None disclosed  Social History:  Patient reports that  she graduated high school and did some college work.  Is currently employed at [employer].  Patient reports that she has a [place] background and affiliates with [employer].  She reports that she has strong family support.  Patient reports that she has 2 children 60 years old. Tobacco Cessation:  N/A, patient does not  currently use tobacco products  Current Medications:  Current Facility-Administered Medications  Medication Dose Route Frequency Provider Last Rate Last Admin   acetaminophen  (TYLENOL ) tablet 650 mg  650 mg Oral Q6H PRN Trudy Carwin, NP   650 mg at 08/16/24 1046   acetaminophen  (TYLENOL ) tablet 650 mg  650 mg Oral Once Trudy Carwin, NP       alum & mag hydroxide-simeth (MAALOX/MYLANTA) 200-200-20 MG/5ML suspension 30 mL  30 mL Oral Q4H PRN Trudy Carwin, NP       cloNIDine (CATAPRES) tablet 0.1 mg  0.1 mg Oral Once Trudy Carwin, NP       magnesium hydroxide (MILK OF MAGNESIA) suspension 30 mL  30 mL Oral Daily PRN Trudy Carwin, NP       melatonin tablet 10 mg  10 mg Oral QHS Trudy Carwin, NP   10 mg at 08/16/24 0421   No current outpatient medications on file.    PTA Medications:  Facility Ordered Medications  Medication   acetaminophen  (TYLENOL ) tablet 650 mg   alum & mag hydroxide-simeth (MAALOX/MYLANTA) 200-200-20 MG/5ML suspension 30 mL   magnesium hydroxide (MILK OF MAGNESIA) suspension 30 mL   cloNIDine (CATAPRES) tablet 0.1 mg   acetaminophen  (TYLENOL ) tablet 650 mg   melatonin tablet 10 mg       08/02/2024    3:49 PM 06/02/2023   10:04 AM 08/28/2018    4:58 PM  Depression screen PHQ 2/9  Decreased Interest 1 1 0  Down, Depressed, Hopeless 2 0 0  PHQ - 2 Score 3 1 0  Altered sleeping 0 2 1  Tired, decreased energy 1 2 0  Change in appetite 1 0 0  Feeling bad or failure about yourself  1 0 0  Trouble concentrating 2 0 0  Moving slowly or fidgety/restless 2 0 0  Suicidal thoughts 0 0 0  PHQ-9 Score 10 5 1     Flowsheet Row ED from 08/15/2024 in [facility] ED from 10/13/2023 in [facility] Emergency Department at [facility]  C-SSRS RISK CATEGORY Low Risk No Risk    Musculoskeletal  Strength & Muscle Tone: within normal limits Gait & Station: normal Patient leans: N/A  Psychiatric Specialty Exam  Presentation  General  Appearance:  Appropriate for Environment; Casual  Eye Contact: Good  Speech: Normal Rate; Clear and Coherent  Speech Volume: Normal  Handedness: Right   Mood and Affect  Mood: Anxious; Dysphoric  Affect: Congruent   Thought Process  Thought Processes: Coherent  Descriptions of Associations:Intact  Orientation:Full (Time, Place and Person)  Thought Content:Logical  Diagnosis of Schizophrenia or Schizoaffective disorder in past: No    Hallucinations:Hallucinations: None  Ideas of Reference:None  Suicidal Thoughts:Suicidal Thoughts: No  Homicidal Thoughts:Homicidal Thoughts: No   Sensorium  Memory: Immediate Fair  Judgment: Fair  Insight: Fair   Art therapist  Concentration: Fair  Attention Span: Fair  Recall: Fair  Fund of Knowledge: Good  Language: Good   Psychomotor Activity  Psychomotor Activity: Psychomotor Activity: Normal   Assets  Assets: Resilience; Desire for Improvement; Social Support; Housing   Sleep  Sleep: Sleep: Poor  Estimated Sleeping Duration (Last 24 Hours):  5.25-5.75 hours  Nutritional Assessment (For OBS and FBC admissions only) Has the patient had a weight loss or gain of 10 pounds or more in the last 3 months?: No Has the patient had a decrease in food intake/or appetite?: No Does the patient have dental problems?: No Does the patient have eating habits or behaviors that may be indicators of an eating disorder including binging or inducing vomiting?: No Has the patient recently lost weight without trying?: 0 Has the patient been eating poorly because of a decreased appetite?: 0 Malnutrition Screening Tool Score: 0    Physical Exam  Physical Exam Constitutional:      Appearance: She is not ill-appearing or toxic-appearing.  Pulmonary:     Effort: Pulmonary effort is normal.  Musculoskeletal:        General: Normal range of motion.  Neurological:     Mental Status: She is oriented to  person, place, and time.    Review of Systems  Constitutional:  Negative for chills and fever.  Psychiatric/Behavioral:  Negative for depression, hallucinations, substance abuse and suicidal ideas. The patient is nervous/anxious.    Blood pressure (!) 130/90, pulse 86, temperature 98.5 F (36.9 C), temperature source Oral, resp. rate 18, SpO2 98%. There is no height or weight on file to calculate BMI.  Demographic Factors:  Access to firearms, gun in possession of sister or friend  Loss Factors: Loss of significant relationship  Historical Factors: NA  Risk Reduction Factors:   Responsible for children under 97 years of age, Sense of responsibility to family, Religious beliefs about death, Employed, and Positive social support  Continued Clinical Symptoms:  Severe Anxiety and/or Agitation  Cognitive Features That Contribute To Risk:  None    Suicide Risk:  Mild:  Suicidal ideation of limited frequency, intensity, duration, and specificity.  There are no identifiable plans, no associated intent, mild dysphoria and related symptoms, good self-control (both objective and subjective assessment), few other risk factors, and identifiable protective factors, including available and accessible social support.  Plan Of Care/Follow-up recommendations:   Activity: as tolerated  Diet: heart healthy  Other: -Follow-up with your outpatient psychiatric provider -instructions on appointment date, time, and address (location) are provided to you in discharge paperwork.  -Take your psychiatric medications as prescribed at discharge - instructions are provided to you in the discharge paperwork  -Follow-up with outpatient primary care doctor and other specialists -for management of preventative medicine and chronic medical disease  -Testing: Follow-up with outpatient provider for abnormal lab results:   -If you are prescribed an atypical antipsychotic medication, we recommend that your  outpatient psychiatrist follow routine screening for side effects within 3 months of discharge, including monitoring: AIMS scale, height, weight, blood pressure, fasting lipid panel, HbA1c, and fasting blood sugar.   -Recommend total abstinence from alcohol, tobacco, and other illicit drug use at discharge.   -If your psychiatric symptoms recur, worsen, or if you have side effects to your psychiatric medications, call your outpatient psychiatric provider, 911, [phone] or go to the nearest emergency department.  -If suicidal thoughts occur, immediately call your outpatient psychiatric provider, 911, [phone] or go to the nearest emergency department.   Disposition: Transfer to [facility]   PATTI OLDEN, MD 08/16/2024, 11:03 AM

## 2024-08-16 NOTE — Progress Notes (Signed)
   08/16/24 1644  Vital Signs  Pulse Rate 86  Pulse Rate Source Monitor  BP (!) 164/106  BP Location Right Arm  BP Method Automatic  Patient Position (if appropriate) Standing  Oxygen Therapy  SpO2 100 %   Patient's BP elevated this evening. Pt is asymptomatic at this time. No s/s of distress observed at this time. MD notified via secure chat, no further orders at this time. Pt reports that she is very anxious being here and believes that is why her BP is elevated.

## 2024-08-16 NOTE — Group Note (Signed)
 Date:  08/16/2024 Time:  4:22 PM  Group Topic/Focus:  Overcoming Stress:   The focus of this group is to define stress and help patients assess their triggers.    Participation Level:  Active  Participation Quality:  Appropriate  Affect:  Appropriate  Cognitive:  Appropriate  Insight: Appropriate  Engagement in Group:  Engaged  Modes of Intervention:  Discussion   Annalee  Dalyla Chui 08/16/2024, 4:22 PM

## 2024-08-16 NOTE — Group Note (Signed)
 Date:  08/16/2024 Time:  9:11 PM  Group Topic/Focus:  Wrap-Up Group:   The focus of this group is to help patients review their daily goal of treatment and discuss progress on daily workbooks.    Participation Level:  Active  Participation Quality:  Appropriate and Attentive  Affect:  Tearful  Cognitive:  Alert and Appropriate  Insight: Appropriate  Engagement in Group:  Engaged  Modes of Intervention:  Discussion and Education  Additional Comments:  Pt attended and participated in wrap up group this evening. Pt stated that they have felt very overwhelmed since being admitted to [facility], but is grateful that they have been able to see both their children and their mother. While they are here pt states that they would benefit from IOP and requests to speak to their SW about IOP.   Brittany Bowers 08/16/2024, 9:11 PM

## 2024-08-16 NOTE — Group Note (Signed)
 Date:  08/16/2024 Time:  2:51 PM  Group Topic/Focus:  Emotional Education: The session centered around the Circle of Control activity, which helps participants identify what aspects of their life they can control versus what is outside their control. This exercise encourages emotional awareness, personal empowerment, and stress management. Participants were guided to reflect on situations in their life and categorize them into three areas: Things I can control, Things I can influence, and Things I cannot control. The group was encouraged to reflect on the impact these distinctions have on their emotional well-being.  Participation Level:  Did Not Attend   Brittany Bowers 08/16/2024, 2:51 PM

## 2024-08-16 NOTE — Plan of Care (Signed)
   Problem: Education: Goal: Emotional status will improve Outcome: Progressing Goal: Mental status will improve Outcome: Progressing Goal: Verbalization of understanding the information provided will improve Outcome: Progressing

## 2024-08-16 NOTE — Progress Notes (Signed)
(  Sleep Hours) -  (Any PRNs that were needed, meds refused, or side effects to meds)- hydroxyzine 10mg , Trazodone 50mg   (Any disturbances and when (visitation, over night)-none  (Concerns raised by the patient)-   (SI/HI/AVH)- denies  *Patient was administered 1 time order of Clonidine mg po for elevated BP

## 2024-08-16 NOTE — ED Notes (Signed)
 Report called to Erie Noe, RN [facility].

## 2024-08-16 NOTE — Plan of Care (Signed)
   Problem: Education: Goal: Knowledge of [place] [facility] Education information/materials will improve Outcome: Progressing Goal: Verbalization of understanding the information provided will improve Outcome: Progressing

## 2024-08-16 NOTE — Discharge Instructions (Signed)
 Activity: as tolerated  Diet: heart healthy  Other: -Follow-up with your outpatient psychiatric provider -instructions on appointment date, time, and address (location) are provided to you in discharge paperwork.  -Take your psychiatric medications as prescribed at discharge - instructions are provided to you in the discharge paperwork  -Follow-up with outpatient primary care doctor and other specialists -for management of preventative medicine and chronic medical disease  -Testing: Follow-up with outpatient provider for abnormal lab results:   -If you are prescribed an atypical antipsychotic medication, we recommend that your outpatient psychiatrist follow routine screening for side effects within 3 months of discharge, including monitoring: AIMS scale, height, weight, blood pressure, fasting lipid panel, HbA1c, and fasting blood sugar.   -Recommend total abstinence from alcohol, tobacco, and other illicit drug use at discharge.   -If your psychiatric symptoms recur, worsen, or if you have side effects to your psychiatric medications, call your outpatient psychiatric provider, 911, 988 or go to the nearest emergency department.  -If suicidal thoughts occur, immediately call your outpatient psychiatric provider, 911, 988 or go to the nearest emergency department.

## 2024-08-16 NOTE — Progress Notes (Signed)
 CONTACT NOTE:   Shelda Silvan -- CPS social worker [phone]  CSW arranged meeting at 11 am on 08/17/24 for CPS worker meeting. Patient was notified, conference room reserved.   [name], LCSW-A

## 2024-08-16 NOTE — ED Notes (Signed)
 Brittany Bowers transferred to [facility] per NP order. Discussed with the patient and all questions fully answered. An After Visit Summary  and EMTALA were printed and to be given to the receiving nurse. All  belonging returned. Patient escorted out and transferred via [employer]. [name]  08/16/2024 1:07 PM

## 2024-08-17 DIAGNOSIS — F419 Anxiety disorder, unspecified: Secondary | ICD-10-CM

## 2024-08-17 NOTE — Progress Notes (Addendum)
  [facility] Adult Case Management Discharge Plan :  Will you be returning to the same living situation after discharge:  Yes,  patient will be returning to her home, address on file.  At discharge, do you have transportation home?: Yes,  patient's friend, Milana Sharps [phone], is providing transportation at 12 pm. Do you have the ability to pay for your medications: Yes,  patient has active health insurance.   Release of information consent forms completed and in the chart;  Patient's signature needed at discharge.  Patient to Follow up at:  Follow-up Information     [facility] Follow up on 08/27/2024.   Why: You have a hospital follow up appointment for therapy and medication management services on 08/27/24 at 10:00 am.  The appointment will be Virtual, telehealth. Contact information: [address]  [address] [place] [address] [phone]                 Next level of care provider has access to [facility] Link:no  Safety Planning and Suicide Prevention discussed: Yes,  completed with patient.    Suicide Prevention Education was reviewed thoroughly with patient, including risk factors, warning signs, and what to do. Mobile Crisis services were described and that telephone number pointed out, with encouragement to patient to put this number in personal cell phone. Brochure was provided to patient to share with natural supports. Patient acknowledged the ways in which they are at risk, and how working through each of their issues can gradually start to reduce their risk factors. Patient was encouraged to think of the information in the context of people in their own lives. Patient denied having access to firearms Patient verbalized understanding of information provided. Patient endorsed a desire to live.      Has patient been referred to the Quitline?: Patient does not use tobacco/nicotine products  Patient has been referred for addiction treatment: No known substance use  disorder.  [name], LCSWA 08/17/2024, 11:30 AM

## 2024-08-17 NOTE — Group Note (Signed)
 Date:  08/17/2024 Time:  9:18 AM  Group Topic/Focus:  Goals Group:   The focus of this group is to help patients establish daily goals to achieve during treatment and discuss how the patient can incorporate goal setting into their daily lives to aide in recovery.    Participation Level:  Did Not Attend  Participation Quality:  N/A  Affect:  N/A  Cognitive:  N/A  Insight: None  Engagement in Group:  None  Modes of Intervention:  N/A  Additional Comments:  Patient did not attend goals group.  [name] 08/17/2024, 9:18 AM

## 2024-08-17 NOTE — [facility] Suicide Risk Assessment (Signed)
 Suicide Risk Assessment  Discharge Assessment    [facility] Discharge Suicide Risk Assessment   Principal Problem: Anxiety Discharge Diagnoses: Principal Problem:   Anxiety   Total Time spent with patient: 1 hour  Hospital Course:   During the patient's hospitalization, patient had extensive initial psychiatric evaluation, and follow-up psychiatric evaluations every day.   Psychiatric diagnoses provided upon initial assessment: GAD   Patient's psychiatric medications were adjusted on admission: No medication regimen started   During the hospitalization, other adjustments were made to the patient's psychiatric medication regimen: No changes   Patient's care was discussed during the interdisciplinary team meeting every day during the hospitalization.   The patient did not take medication while hospitalized, but stated her anxiety improved with help of the groups and family meetings.    Gradually, patient started adjusting to milieu. The patient was evaluated each day by a clinical provider to ascertain response to treatment. Improvement was noted by the patient's report of decreasing symptoms, improved sleep and appetite, affect, medication tolerance, behavior, and participation in unit programming.  Patient was asked each day to complete a self inventory noting mood, mental status, pain, new symptoms, anxiety and concerns.   Symptoms were reported as significantly decreased or resolved completely by discharge.  The patient reports that their mood is stable.  The patient denied having suicidal thoughts for more than 48 hours prior to discharge.  Patient denies having homicidal thoughts.  Patient denies having auditory hallucinations.  Patient denies any visual hallucinations or other symptoms of psychosis.  The patient was motivated to continue taking medication with a goal of continued improvement in mental health.    The patient reports their target psychiatric symptoms of anxiety responded  well to the psychiatric medications, and the patient reports overall benefit other psychiatric hospitalization. Supportive psychotherapy was provided to the patient. The patient also participated in regular group therapy while hospitalized. Coping skills, problem solving as well as relaxation therapies were also part of the unit programming.   Labs were reviewed with the patient, and abnormal results were discussed with the patient.   The patient is able to verbalize their individual safety plan to this provider.   # It is recommended to the patient to continue psychiatric medications as prescribed, after discharge from the hospital.     # It is recommended to the patient to follow up with your outpatient psychiatric provider and PCP.   # It was discussed with the patient, the impact of alcohol, drugs, tobacco have been there overall psychiatric and medical wellbeing, and total abstinence from substance use was recommended the patient.ed.   # Prescriptions provided or sent directly to preferred pharmacy at discharge. Patient agreeable to plan. Given opportunity to ask questions. Appears to feel comfortable with discharge.    # In the event of worsening symptoms, the patient is instructed to call the crisis hotline, 911 and or go to the nearest ED for appropriate evaluation and treatment of symptoms. To follow-up with primary care provider for other medical issues, concerns and or health care needs   # Patient was discharged home with a plan to follow up as noted below.       On day of discharge she feels anxious because of her hospitalization and wants to be with her children.  The patient reports that her sleep and appetite remain stable without significant changes. She acknowledges that her previous statements were taken out of context and recognizes the seriousness of making such comments. She expresses insight  into how her words caused concern and states her commitment to avoid making similar  statements in the future.  She denies any SI, HI, AVH, paranoia, and ideas of reference. Musculoskeletal: Strength & Muscle Tone: within normal limits Gait & Station: normal Patient leans: N/A  Psychiatric Specialty Exam  Presentation  General Appearance:  Casual (tattoos on chest and arms, hair in a bonnet)  Eye Contact: Good  Speech: Clear and Coherent  Speech Volume: Normal  Handedness: Right   Mood and Affect  Mood: Anxious (I'm anxious being here. I just want to go home with my kids)  Duration of Depression Symptoms: N/A  Affect: Congruent   Thought Process  Thought Processes: Coherent  Descriptions of Associations:Intact  Orientation:Full (Time, Place and Person)  Thought Content:Logical  History of Schizophrenia/Schizoaffective disorder:No  Duration of Psychotic Symptoms:No data recorded Hallucinations:Hallucinations: None  Ideas of Reference:None  Suicidal Thoughts:Suicidal Thoughts: No  Homicidal Thoughts:Homicidal Thoughts: No   Sensorium  Memory: Immediate Good; Recent Good  Judgment: Poor  Insight: Good   Executive Functions  Concentration: Good  Attention Span: Good  Recall: Good  Fund of Knowledge: Good  Language: Good   Psychomotor Activity  Psychomotor Activity: Psychomotor Activity: Normal   Assets  Assets: Housing; Health and safety inspector; Desire for Improvement; Communication Skills; Physical Health; Resilience; Social Support   Sleep  Sleep: Sleep: Good  Estimated Sleeping Duration (Last 24 Hours): 6.75-8.25 hours  Physical Exam: Physical Exam ROS Blood pressure (!) 122/91, pulse 85, temperature 98.2 F (36.8 C), temperature source Oral, resp. rate 16, height 5' 2 (1.575 m), weight 80.3 kg, SpO2 100%. Body mass index is 32.37 kg/m.  Mental Status Per Nursing Assessment::   On Admission:  Suicidal ideation indicated by patient  Demographic Factors:  NA  Loss  Factors: NA  Historical Factors: Family history of suicide  Risk Reduction Factors:   Responsible for children under 81 years of age, Sense of responsibility to family, Religious beliefs about death, Positive social support, Positive therapeutic relationship, and Positive coping skills or problem solving skills  Continued Clinical Symptoms:  Severe Anxiety and/or Agitation  Cognitive Features That Contribute To Risk:  None    Suicide Risk:  Minimal: No identifiable suicidal ideation.  Patients presenting with no risk factors but with morbid ruminations; may be classified as minimal risk based on the severity of the depressive symptoms   Follow-up Information     [facility]. Schedule an appointment as soon as possible for a visit.   Why: You have a hospital follow up appointment for therapy and medication management services on  .  The appointment will be Virtual, telehealth. Contact information: [address]  [address] [place] [address] [phone]                 Plan Of Care/Follow-up recommendations:   Activity: as tolerated   Diet: heart healthy   Other: -Follow-up with your outpatient psychiatric provider -instructions on appointment date, time, and address (location) are provided to you in discharge paperwork.   -Take your psychiatric medications as prescribed at discharge - instructions are provided to you in the discharge paperwork   -Follow-up with outpatient primary care doctor and other specialists -for management of chronic medical disease, including: N/A   -Testing: Follow-up with outpatient provider for abnormal lab results: N/A   -Recommend abstinence from alcohol, tobacco, and other illicit drug use at discharge.    -If your psychiatric symptoms recur, worsen, or if you have side effects to your psychiatric medications,  call your outpatient psychiatric provider, 911, 988 or go to the nearest emergency department.   -If suicidal thoughts  recur, call your outpatient psychiatric provider, 911, 988 or go to the nearest emergency department.  Alan Maiden, MD 08/17/2024, 11:17 AM

## 2024-08-17 NOTE — H&P (Signed)
 Psychiatric Admission Assessment Adult  Patient Identification: Brittany Bowers MRN:  [id number] Date of Evaluation:  08/17/2024 Chief Complaint:  Depression with suicidal ideation [F32.A, R45.851] Suicidal ideation [R45.851] Principal Diagnosis: Anxiety Diagnosis:  Principal Problem:   Anxiety  History of Present Illness:   CC:   I was too honest with the police  HPI: Brittany Bowers is a 28 y.o. female  with a past psychiatric history of GAD. Patient initially arrived to [facility] under IVC on 08/16/2024 for suicidal statements, and admitted to [facility] under IVC on 08/17/2024 for acute safety concerns. No significant past psychiatric history. Per IVC, respondent stated I am just tired but no one can take care of my kids like I can so I will take them out too.   Per IVC documentation, the patient reportedly stated, "I am just tired but no one can take care of my kids like I can so I will take them out too." However, per both the patient and her sister, the patient actually stated: "I am just tired of living, but no one can take care of my kids like I can, so I am not thinking about killing myself."  During the initial assessment on 08/17/2024, the patient stated that her comments scared her sister and were taken out of context. She explained that she had been on the phone with her sister while doing dishes after dinner. She felt overwhelmed due to multiple psychosocial stressors, including her son's upcoming homecoming event, an overdue car payment, and a difficult teacher-parent conference regarding her son's behavior. She reiterated that she had said she was tired of living but specifically denied intent to harm herself or her children.  She reported that after the call went silent for several minutes, police arrived at her home for a wellness check. She was unaware of what her sister had told authorities, and repeated to them what she had told her sister. Her best friend, who lives nearby, came  over during the encounter. When asked about firearms in the home, the patient handed over the only gun in the house to her best friend in front of police. She agreed to go to the hospital for evaluation and was subsequently admitted under IVC.  The patient reports ongoing stress due to her busy daily routine caring for her two children, ages 24 and 78, and her full-time employment at [employer]. She described waking at 5:30 a.m. to prepare the children for school, taking them to school herself due to bus schedule changes, then going directly to work. Afternoons are occupied with picking up her children and taking them to extracurricular activities. 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  She denies current suicidal ideation (SI), homicidal ideation (HI), auditory or visual hallucinations (AVH), paranoia, delusions, or ideas of reference. She denies significant changes in sleep or appetite and denies feelings of guilt or hopelessness. She acknowledges intermittent marijuana use at night after her children are in bed to manage stress.  She reports that she does not often reach out for support but recognizes the need to do so. She states she has supportive relationships with her sister, mother, best friend, and neighbors, and is looking forward to starting therapy already arranged through her insurance.  Her psychiatric history includes only a diagnosis of postpartum anxiety; she has never been psychiatrically hospitalized or prescribed psychiatric medications. She denies any history of trauma, although she  describes a distant relationship with her father after age 34. She denies prior suicide attempts. She reports no firearms remain in the home.  While hospitalized, she has had multiple visits from her sister, mother, and best friend. She now expresses understanding that her comments  were alarming but taken out of context. She is motivated to engage in therapy and to use her support system more effectively.  Collateral information via phone, Patient's Brittany Bowers [phone]  Patient's sister, Brittany Bowers, reports that the patient has been feeling significantly overwhelmed due to work, Herbalist, and the demands of raising two young children alone. Although Brittany Bowers tries to provide support, she resides in [place] and was unable to reach the patient in time during the recent incident.  Brittany Bowers works in parole and has extensive experience dealing with mental health concerns. She reports that during a recent phone conversation, the patient expressed feeling tired and overwhelmed. When the patient said, "I am tired, and I am trying not to unalive myself," Brittany Bowers became alarmed. She stated that the patient's choice of words was surprising and triggering, especially in light of a past trauma. She attempted to calm the patient and initiated a conversation, but noted that the patient did not appear to take her concerns seriously. Brittany Bowers emphasized to the patient that she was not joking and expressed worry for her safety.  Following this exchange, Brittany Bowers contacted a crisis hotline. Crisis personnel engaged with the patient directly while Brittany Bowers remained on the phone. The patient disclosed to crisis staff the same information she had shared with Brittany Bowers. This led to an involuntary commitment (IVC). The patient later acknowledged that her wording may have been poor and not fully reflective of her intent.  Brittany Bowers disclosed a personal history of witnessing a suicide attempt by their mother via overdose on her (Brittany Bowers) 13th birthday. She states that this history is not known to the patient, and their mother has never been formally diagnosed or treated for any psychiatric condition.  Brittany Bowers describes the patient as a highly  sensitive individual with a stable and successful career, currently under immense pressure. She reports that after spending time with the patient over the past two days, she believes the patient was reacting to acute stress and not expressing suicidal intent. Ms. Stille believes the patient is safe for discharge and that the situation reflected a need for increased emotional support rather than a psychiatric crisis.  The sisters have discussed the importance of reaching out to support systems, including their sorority sisters. Counseling has been scheduled through the patient's insurance, and therapy is expected to begin shortly.  Ms. Sisk confirmed that there are no firearms in the patient's home. She also reiterated that aside from the incident involving their mother (who was never diagnosed or treated), there is no known family psychiatric history.   Associated Signs/Symptoms: Depression Symptoms:  anxiety, (Hypo) Manic Symptoms:  None reported Anxiety Symptoms:  Excessive Worry, Psychotic Symptoms:  None reported PTSD Symptoms: NA Total Time spent with patient: 1 hour  Past Psychiatric History:  Previous psychiatric diagnoses: GAD Prior psychiatric treatment: None Psychiatric medication compliance history: N/A  Current psychiatric treatment: None Current psychiatrist: None Current therapist: None  Previous hospitalizations None History of suicide attempts: None History of self harm: None   Is the patient at risk to self? No.  Has the patient been a risk to self in the past 6 months? No.  Has the  patient been a risk to self within the distant past? No.  Is the patient a risk to others? No.  Has the patient been a risk to others in the past 6 months? No.  Has the patient been a risk to others within the distant past? No.   [place] Scale:  Flowsheet Row [facility] ([facility]) [facility] from 08/15/2024 in [facility] ED from 10/13/2023 in [facility] Emergency Department at [facility]  C-SSRS RISK CATEGORY No Risk Low Risk No Risk     Prior Inpatient Therapy: No.  Prior Outpatient Therapy: No.   Alcohol Screening: 1. How often do you have a drink containing alcohol?: Monthly or less 2. How many drinks containing alcohol do you have on a typical day when you are drinking?: 1 or 2 3. How often do you have six or more drinks on one occasion?: Never AUDIT-C Score: 1 4. How often during the last year have you found that you were not able to stop drinking once you had started?: Never 5. How often during the last year have you failed to do what was normally expected from you because of drinking?: Never 6. How often during the last year have you needed a first drink in the morning to get yourself going after a heavy drinking session?: Never 7. How often during the last year have you had a feeling of guilt of remorse after drinking?: Never 8. How often during the last year have you been unable to remember what happened the night before because you had been drinking?: Never 9. Have you or someone else been injured as a result of your drinking?: No 10. Has a relative or friend or a doctor or another health worker been concerned about your drinking or suggested you cut down?: No Alcohol Use Disorder Identification Test Final Score (AUDIT): 1 Substance Abuse History in the last 12 months:  Yes.   Consequences of Substance Abuse: Negative Previous Psychotropic Medications: No  Psychological Evaluations: No  Past Medical History:  Past Medical History:  Diagnosis Date   Asthma    uses inhaler twice/day    Past Surgical History:  Procedure Laterality Date   TOE SURGERY Bilateral    Family History:  Family History  Problem Relation Age of Onset   Diabetes Father    Diabetes Maternal Grandmother    Stroke Maternal Grandmother        TIA   Cancer Maternal Grandfather     Cancer Maternal Aunt    Stroke Maternal Aunt    Alcohol abuse Neg Hx    Arthritis Neg Hx    Asthma Neg Hx    Birth defects Neg Hx    COPD Neg Hx    Depression Neg Hx    Drug abuse Neg Hx    Early death Neg Hx    Hearing loss Neg Hx    Heart disease Neg Hx    Hyperlipidemia Neg Hx    Hypertension Neg Hx    Kidney disease Neg Hx    Learning disabilities Neg Hx    Mental illness Neg Hx    Mental retardation Neg Hx    Miscarriages / Stillbirths Neg Hx    Vision loss Neg Hx    Varicose Veins Neg Hx    Family Psychiatric  History:  Mother attempted suicide when patient was young, this information was given via sister, as patient is unaware and stated there  was no psychiatric family history  Tobacco Screening:  Social History   Tobacco Use  Smoking Status Former   Current packs/day: 0.00   Types: Cigarettes   Quit date: 09/17/2017   Years since quitting: 6.9  Smokeless Tobacco Never    BH Tobacco Counseling     Are you interested in Tobacco Cessation Medications?  N/A, patient does not use tobacco products Counseled patient on smoking cessation:  N/A, patient does not use tobacco products Reason Tobacco Screening Not Completed: No value filed.       Social History:  Social History   Substance and Sexual Activity  Alcohol Use No   Comment: occational     Social History   Substance and Sexual Activity  Drug Use No   Types: Marijuana   Comment: last use 3 feb 19    Additional Social History: Marital status: Single Are you sexually active?: Yes What is your sexual orientation?: Bisexual Has your sexual activity been affected by drugs, alcohol, medication, or emotional stress?: None reported Does patient have children?: Yes How many children?: 2 How is patient's relationship with their children?: Patient reports a great relationship with her children                         Allergies:   Allergies  Allergen Reactions   Bee Venom Swelling   Onion  Other (See Comments)    Upset stomach   Pork-Derived Products Other (See Comments)    Religious preference   Lab Results:  Results for orders placed or performed during the hospital encounter of 08/15/24 (from the past 48 hours)  POC urine preg, ED     Status: None   Collection Time: 08/15/24  9:44 PM  Result Value Ref Range   Preg Test, Ur Negative Negative  POCT Urine Drug Screen - (I-Screen)     Status: Abnormal   Collection Time: 08/15/24  9:44 PM  Result Value Ref Range   POC Amphetamine UR None Detected NONE DETECTED (Cut Off Level 1000 ng/mL)   POC Secobarbital (BAR) None Detected NONE DETECTED (Cut Off Level 300 ng/mL)   POC Buprenorphine (BUP) None Detected NONE DETECTED (Cut Off Level 10 ng/mL)   POC Oxazepam (BZO) None Detected NONE DETECTED (Cut Off Level 300 ng/mL)   POC Cocaine UR None Detected NONE DETECTED (Cut Off Level 300 ng/mL)   POC Methamphetamine UR None Detected NONE DETECTED (Cut Off Level 1000 ng/mL)   POC Morphine None Detected NONE DETECTED (Cut Off Level 300 ng/mL)   POC Methadone UR None Detected NONE DETECTED (Cut Off Level 300 ng/mL)   POC Oxycodone  UR None Detected NONE DETECTED (Cut Off Level 100 ng/mL)   POC Marijuana UR Positive (A) NONE DETECTED (Cut Off Level 50 ng/mL)  CBC with Differential/Platelet     Status: Abnormal   Collection Time: 08/15/24  9:48 PM  Result Value Ref Range   WBC 11.8 (H) 4.0 - 10.5 K/uL   RBC 4.74 3.87 - 5.11 MIL/uL   Hemoglobin 14.0 12.0 - 15.0 g/dL   HCT 57.4 63.9 - 53.9 %   MCV 89.7 80.0 - 100.0 fL   MCH 29.5 26.0 - 34.0 pg   MCHC 32.9 30.0 - 36.0 g/dL   RDW 87.1 88.4 - 84.4 %   Platelets 353 150 - 400 K/uL   nRBC 0.0 0.0 - 0.2 %   Neutrophils Relative % 52 %   Neutro Abs 6.1 1.7 - 7.7 K/uL  Lymphocytes Relative 40 %   Lymphs Abs 4.7 (H) 0.7 - 4.0 K/uL   Monocytes Relative 5 %   Monocytes Absolute 0.6 0.1 - 1.0 K/uL   Eosinophils Relative 0 %   Eosinophils Absolute 0.0 0.0 - 0.5 K/uL   Basophils Relative 3  %   Basophils Absolute 0.4 (H) 0.0 - 0.1 K/uL   WBC Morphology See Note     Comment:  Morphology unremarkable   RBC Morphology MORPHOLOGY UNREMARKABLE    Smear Review See Note     Comment:  Normal Platelet Morphology Performed at [facility], [address], [place], [place] [address]   Comprehensive metabolic panel     Status: Abnormal   Collection Time: 08/15/24  9:48 PM  Result Value Ref Range   Sodium 137 135 - 145 mmol/L   Potassium 3.7 3.5 - 5.1 mmol/L   Chloride 104 98 - 111 mmol/L   CO2 22 22 - 32 mmol/L   Glucose, Bld 116 (H) 70 - 99 mg/dL    Comment: Glucose reference range applies only to samples taken after fasting for at least 8 hours.   BUN 12 6 - 20 mg/dL   Creatinine, Ser 9.39 0.44 - 1.00 mg/dL   Calcium 9.8 8.9 - 89.6 mg/dL   Total Protein 7.8 6.5 - 8.1 g/dL   Albumin 4.5 3.5 - 5.0 g/dL   AST 21 15 - 41 U/L   ALT 18 0 - 44 U/L   Alkaline Phosphatase 54 38 - 126 U/L   Total Bilirubin 0.5 0.0 - 1.2 mg/dL   GFR, Estimated >39 >39 mL/min    Comment: (NOTE) Calculated using the CKD-EPI Creatinine Equation (2021)    Anion gap 11 5 - 15    Comment: Performed at [facility], [address], [place], [place] [address]  Ethanol     Status: None   Collection Time: 08/15/24  9:48 PM  Result Value Ref Range   Alcohol, Ethyl (B) <15 <15 mg/dL    Comment: (NOTE) For medical purposes only. Performed at [facility], [address] [place], [place] [address]   TSH     Status: None   Collection Time: 08/15/24  9:48 PM  Result Value Ref Range   TSH 3.176 0.350 - 4.500 uIU/mL    Comment: Performed by a 3rd Generation assay with a functional sensitivity of <=0.01 uIU/mL. Performed at [facility], [address] [place], [place] [address]     Blood Alcohol level:  Lab Results  Component Value Date   [facility] <15 08/15/2024    Metabolic Disorder Labs:  Lab Results  Component Value Date   HGBA1C 5.7 (H) 10/31/2017   No results found for:  PROLACTIN No results found for: CHOL, TRIG, HDL, CHOLHDL, VLDL, LDLCALC  Current Medications: Current Facility-Administered Medications  Medication Dose Route Frequency Provider Last Rate Last Admin   acetaminophen  (TYLENOL ) tablet 650 mg  650 mg Oral Q6H PRN Lenard Calin, MD   650 mg at 08/16/24 1724   alum & mag hydroxide-simeth (MAALOX/MYLANTA) 200-200-20 MG/5ML suspension 30 mL  30 mL Oral Q4H PRN Lenard Calin, MD       haloperidol (HALDOL) tablet 5 mg  5 mg Oral TID PRN Trudy Carwin, NP       And   diphenhydrAMINE  (BENADRYL ) capsule 50 mg  50 mg Oral TID PRN Trudy Carwin, NP       hydrOXYzine (ATARAX) tablet 10 mg  10 mg  Oral TID PRN Lenard Calin, MD   10 mg at 08/16/24 2142   magnesium hydroxide (MILK OF MAGNESIA) suspension 30 mL  30 mL Oral Daily PRN Lenard Calin, MD       OLANZapine (ZYPREXA) injection 10 mg  10 mg Intramuscular TID PRN Trudy Carwin, NP       OLANZapine (ZYPREXA) injection 5 mg  5 mg Intramuscular TID PRN Trudy Carwin, NP       traZODone (DESYREL) tablet 50 mg  50 mg Oral QHS PRN Lenard Calin, MD   50 mg at 08/16/24 2142   PTA Medications: No medications prior to admission.    AIMS:  ,  ,  ,  ,  ,  ,    Musculoskeletal: Strength & Muscle Tone: within normal limits Gait & Station: normal Patient leans: N/A            Psychiatric Specialty Exam:  Presentation  General Appearance:  Casual (tattoos on chest and arms, hair in a bonnet)  Eye Contact: Good  Speech: Clear and Coherent  Speech Volume: Normal  Handedness: Right   Mood and Affect  Mood: Anxious (I'm anxious being here. I just want to go home with my kids)  Affect: Congruent   Thought Process  Thought Processes: Coherent  Duration of Psychotic Symptoms:N/A Past Diagnosis of Schizophrenia or Psychoactive disorder: No  Descriptions of Associations:Intact  Orientation:Full (Time, Place and Person)  Thought  Content:Logical  Hallucinations:Hallucinations: None  Ideas of Reference:None  Suicidal Thoughts:Suicidal Thoughts: No  Homicidal Thoughts:Homicidal Thoughts: No   Sensorium  Memory: Immediate Good; Recent Good  Judgment: Poor  Insight: Good   Executive Functions  Concentration: Good  Attention Span: Good  Recall: Good  Fund of Knowledge: Good  Language: Good   Psychomotor Activity  Psychomotor Activity: Psychomotor Activity: Normal   Assets  Assets: Housing; Health and safety inspector; Desire for Improvement; Communication Skills; Physical Health; Resilience; Social Support   Sleep  Sleep: Sleep: Good  Estimated Sleeping Duration (Last 24 Hours): 6.75-8.25 hours   Physical Exam: Physical Exam Constitutional:      General: She is not in acute distress.    Appearance: Normal appearance. She is normal weight. She is not ill-appearing, toxic-appearing or diaphoretic.  HENT:     Head: Normocephalic and atraumatic.  Pulmonary:     Effort: Pulmonary effort is normal.  Neurological:     General: No focal deficit present.     Mental Status: She is alert.    Review of Systems  Gastrointestinal:  Negative for abdominal pain, constipation, diarrhea, nausea and vomiting.  Psychiatric/Behavioral:  Negative for hallucinations and suicidal ideas. The patient is nervous/anxious.    Blood pressure (!) 122/91, pulse 85, temperature 98.2 F (36.8 C), temperature source Oral, resp. rate 16, height 5' 2 (1.575 m), weight 80.3 kg, SpO2 100%. Body mass index is 32.37 kg/m.  Assessment/Plan:  Merilee Wible is a 28 year old woman with a history of generalized anxiety disorder who was admitted under IVC after reportedly making suicidal statements. On evaluation, the patient appears calm, cooperative, and future-oriented, with no evidence of acute psychosis, mania, or major depressive episode. She denies SI/HI/AVH and displays good insight into the events  leading to her admission.  Collateral from the patient's sister supports that the statements made were out of context, with no history of prior suicide attempts or psychiatric hospitalizations. The patient has strong social supports, including her sister, mother, best friend, and neighbors. Therapy has already been arranged through her insurance. There are  no firearms currently in the home.  Based on current evaluation, the patient does not meet criteria for continued involuntary commitment. She is not an imminent danger to herself or others and demonstrates the ability to engage in safety planning, use support systems, and follow up with outpatient care.We will be planning on discharging patient today.  GAD - Trazodone 50 mg at bedtime as needed for insomnia - Atarax 25 mg TID as needed for anxiety - Agitation Protocol:   Safety and Monitoring: - Involuntary admission to inpatient psychiatric unit for safety, stabilization and treatment, will be rescinding IVC as patient is not a danger to self or others - Daily contact with patient to assess and evaluate symptoms and progress in treatment - Patient's case to be discussed in multi-disciplinary team meeting - Observation Level : q15 minute checks - Vital signs:  q12 hours - Precautions: suicide, elopement, and assault  Other as needed medications  Tylenol  650 mg every 6 hours as needed for pain Mylanta 30 mL every 4 hours as needed for indigestion Milk of magnesia 30 mL daily as needed for constipation   The risks/benefits/side-effects/alternatives to the above medication were discussed in detail with the patient and time was given for questions. The patient consents to medication trial. FDA black box warnings, if present, were discussed.  The patient is agreeable with the medication plan, as above. We will monitor the patient's response to pharmacologic treatment, and adjust medications as necessary.  5.   Routine and other pertinent  labs: EKG monitoring: QTc: Not released  Metabolism / endocrine: BMI: Body mass index is 32.37 kg/m.   Observation Level/Precautions:  15 minute checks  Laboratory:  CBC: unremarkable CMP: unremarkable UDS: marijuana positive Ethanol: <10 UA: unremarkable Pregnancy test: negative TSH: WNL  Psychotherapy:    Medications:    Consultations:    Discharge Concerns:    Estimated LOS: 08/17/2024 discharge date  Other:      6.   Group Therapy: - Encouraged patient to participate in unit milieu and in scheduled group therapies  - Short Term Goals: Ability to identify and develop effective coping behaviors will improve - Long Term Goals: Improvement in symptoms so as ready for discharge - Patient is encouraged to participate in group therapy while admitted to the psychiatric unit. - We will address other chronic and acute stressors, which contributed to the patient's Anxiety in order to reduce the risk of self-harm at discharge.  7.   Discharge Planning:  - Social work and case management to assist with discharge planning and identification of hospital follow-up needs prior to discharge - Estimated LOS: 1 day - Discharge Concerns: Need to establish a safety plan; Medication compliance and effectiveness - Discharge Goals: Return home with outpatient referrals for mental health follow-up including medication management/psychotherapy  I certify that inpatient services furnished can reasonably be expected to improve the patient's condition.     Alan Maiden, MD 10/3/202511:06 AM

## 2024-08-17 NOTE — Progress Notes (Signed)
 D: Pt A & O X 4. Denies SI, HI, AVH and pain at this time. D/C home as ordered. Picked up in lobby by my girlfriend. A: D/C instructions reviewed with pt including follow up appointments; compliance encouraged. All belongings from locker 25 returned to pt at time of departure. Safety checks maintained without incident till time of d/c.  R: Pt receptive to care. Verbalized understanding related to d/c instructions. Signed belonging sheet in agreement with items received from locker. Ambulatory with a steady gait. Appears to be in no physical distress at time of departure.

## 2024-08-17 NOTE — Progress Notes (Signed)
 CONTACT NOTE:   Shelda Silvan -- CPS social worker [phone]  [place] cancelled meeting due to a last minute emergent case. CSW notified Shelda that patient will be discharging today and needs to follow up upon discharge.  [name], LCSW-A

## 2024-08-17 NOTE — Discharge Summary (Signed)
 Physician Discharge Summary Note  Patient:  Brittany Bowers is an 28 y.o., female MRN:  [id number] DOB:  1995/11/28 Patient phone:  [phone] (home)  Patient address:   [address] [place] [address],  Total Time spent with patient: 1 hour  Date of Admission:  08/16/2024 Date of Discharge: 08/17/2024  Reason for Admission:   History of Present Illness:    CC:   I was too honest with the police   HPI: Brittany Bowers is a 28 y.o. female  with a past psychiatric history of GAD. Patient initially arrived to [facility] under IVC on 08/16/2024 for suicidal statements, and admitted to [facility] under IVC on 08/17/2024 for acute safety concerns. No significant past psychiatric history. Per IVC, respondent stated I am just tired but no one can take care of my kids like I can so I will take them out too.    Per IVC documentation, the patient reportedly stated, "I am just tired but no one can take care of my kids like I can so I will take them out too." However, per both the patient and her sister, the patient actually stated: "I am just tired of living, but no one can take care of my kids like I can, so I am not thinking about killing myself."  During the initial assessment on 08/17/2024, the patient stated that her comments scared her sister and were taken out of context. She explained that she had been on the phone with her sister while doing dishes after dinner. She felt overwhelmed due to multiple psychosocial stressors, including her son's upcoming homecoming event, an overdue car payment, and a difficult teacher-parent conference regarding her son's behavior. She reiterated that she had said she was tired of living but specifically denied intent to harm herself or her children.  She reported that after the call went silent for several minutes, police arrived at her home for a wellness check. She was unaware of what her sister had told authorities, and repeated to them what she had told her  sister. Her best friend, who lives nearby, came over during the encounter. When asked about firearms in the home, the patient handed over the only gun in the house to her best friend in front of police. She agreed to go to the hospital for evaluation and was subsequently admitted under IVC.  The patient reports ongoing stress due to her busy daily routine caring for her two children, ages 32 and 58, and her full-time employment at [employer]. She described waking at 5:30 a.m. to prepare the children for school, taking them to school herself due to bus schedule changes, then going directly to work. Afternoons are occupied with picking up her children and taking them to extracurricular activities. 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  She denies current suicidal ideation (SI), homicidal ideation (HI), auditory or visual hallucinations (AVH), paranoia, delusions, or ideas of reference. She denies significant changes in sleep or appetite and denies feelings of guilt or hopelessness. She acknowledges intermittent marijuana use at night after her children are in bed to manage stress.  She reports that she does not often reach out for support but recognizes the need to do so. She states she has supportive relationships with her sister, mother, best friend, and neighbors, and is looking forward to starting therapy already arranged through her insurance.  Her psychiatric history includes  only a diagnosis of postpartum anxiety; she has never been psychiatrically hospitalized or prescribed psychiatric medications. She denies any history of trauma, although she describes a distant relationship with her father after age 49. She denies prior suicide attempts. She reports no firearms remain in the home.  While hospitalized, she has had multiple visits from her sister, mother, and best friend. She  now expresses understanding that her comments were alarming but taken out of context. She is motivated to engage in therapy and to use her support system more effectively.   Collateral information via phone, Patient's Brittany Bowers [phone]  Patient's sister, Brittany Bowers, reports that the patient has been feeling significantly overwhelmed due to work, Herbalist, and the demands of raising two young children alone. Although Ms. Loughmiller tries to provide support, she resides in [place] and was unable to reach the patient in time during the recent incident.  Ms. Macchi works in parole and has extensive experience dealing with mental health concerns. She reports that during a recent phone conversation, the patient expressed feeling tired and overwhelmed. When the patient said, "I am tired, and I am trying not to unalive myself," Ms. Ronda became alarmed. She stated that the patient's choice of words was surprising and triggering, especially in light of a past trauma. She attempted to calm the patient and initiated a conversation, but noted that the patient did not appear to take her concerns seriously. Ms. Hun emphasized to the patient that she was not joking and expressed worry for her safety.  Following this exchange, Ms. Cappelletti contacted a crisis hotline. Crisis personnel engaged with the patient directly while Ms. Wolanski remained on the phone. The patient disclosed to crisis staff the same information she had shared with Ms. Glendenning. This led to an involuntary commitment (IVC). The patient later acknowledged that her wording may have been poor and not fully reflective of her intent.  Ms. Gerwig disclosed a personal history of witnessing a suicide attempt by their mother via overdose on her (Ms. Talerico) 15th birthday. She states that this history is not known to the patient, and their mother has never been formally diagnosed or treated for any psychiatric  condition.  Ms. Lindenberger describes the patient as a highly sensitive individual with a stable and successful career, currently under immense pressure. She reports that after spending time with the patient over the past two days, she believes the patient was reacting to acute stress and not expressing suicidal intent. Ms. Bonus believes the patient is safe for discharge and that the situation reflected a need for increased emotional support rather than a psychiatric crisis.  The sisters have discussed the importance of reaching out to support systems, including their sorority sisters. Counseling has been scheduled through the patient's insurance, and therapy is expected to begin shortly.  Ms. Pies confirmed that there are no firearms in the patient's home. She also reiterated that aside from the incident involving their mother (who was never diagnosed or treated), there is no known family psychiatric history.    Principal Problem: Anxiety Discharge Diagnoses: Principal Problem:   Anxiety   Past Psychiatric History:  Previous psychiatric diagnoses: GAD Prior psychiatric treatment: None Psychiatric medication compliance history: N/A   Current psychiatric treatment: None Current psychiatrist: None Current therapist: None   Previous hospitalizations None History of suicide attempts: None History of self harm: None  Past Medical History:  Past Medical History:  Diagnosis Date   Asthma    uses inhaler twice/day  Past Surgical History:  Procedure Laterality Date   TOE SURGERY Bilateral    Family History:  Family History  Problem Relation Age of Onset   Diabetes Father    Diabetes Maternal Grandmother    Stroke Maternal Grandmother        TIA   Cancer Maternal Grandfather    Cancer Maternal Aunt    Stroke Maternal Aunt    Alcohol abuse Neg Hx    Arthritis Neg Hx    Asthma Neg Hx    Birth defects Neg Hx    COPD Neg Hx    Depression Neg Hx    Drug abuse Neg Hx    Early  death Neg Hx    Hearing loss Neg Hx    Heart disease Neg Hx    Hyperlipidemia Neg Hx    Hypertension Neg Hx    Kidney disease Neg Hx    Learning disabilities Neg Hx    Mental illness Neg Hx    Mental retardation Neg Hx    Miscarriages / Stillbirths Neg Hx    Vision loss Neg Hx    Varicose Veins Neg Hx    Family Psychiatric  History:  Mother attempted suicide when patient was young, this information was given via sister, as patient is unaware and stated there was no psychiatric family history  Social History:  Social History   Substance and Sexual Activity  Alcohol Use No   Comment: occational     Social History   Substance and Sexual Activity  Drug Use No   Types: Marijuana   Comment: last use 3 feb 19    Social History   Socioeconomic History   Marital status: Single    Spouse name: Not on file   Number of children: Not on file   Years of education: Not on file   Highest education level: Not on file  Occupational History   Not on file  Tobacco Use   Smoking status: Former    Current packs/day: 0.00    Types: Cigarettes    Quit date: 09/17/2017    Years since quitting: 6.9   Smokeless tobacco: Never  Substance and Sexual Activity   Alcohol use: No    Comment: occational   Drug use: No    Types: Marijuana    Comment: last use 3 feb 19   Sexual activity: Yes    Birth control/protection: None  Other Topics Concern   Not on file  Social History Narrative   Not on file   Social Drivers of Health   Financial Resource Strain: Not on file  Food Insecurity: No Food Insecurity (08/16/2024)   Hunger Vital Sign    Worried About Running Out of Food in the Last Year: Never true    Ran Out of Food in the Last Year: Never true  Transportation Needs: No Transportation Needs (08/16/2024)   PRAPARE - Administrator, Civil Service (Medical): No    Lack of Transportation (Non-Medical): No  Physical Activity: Not on file  Stress: Not on file  Social  Connections: Not on file    Hospital Course:   During the patient's hospitalization, patient had extensive initial psychiatric evaluation, and follow-up psychiatric evaluations every day.  Psychiatric diagnoses provided upon initial assessment: GAD  Patient's psychiatric medications were adjusted on admission: No medication regimen started  During the hospitalization, other adjustments were made to the patient's psychiatric medication regimen: No changes  Patient's care was discussed during the interdisciplinary team meeting  every day during the hospitalization.  The patient did not take medication while hospitalized, but stated her anxiety improved with help of the groups and family meetings.   Gradually, patient started adjusting to milieu. The patient was evaluated each day by a clinical provider to ascertain response to treatment. Improvement was noted by the patient's report of decreasing symptoms, improved sleep and appetite, affect, medication tolerance, behavior, and participation in unit programming.  Patient was asked each day to complete a self inventory noting mood, mental status, pain, new symptoms, anxiety and concerns.   Symptoms were reported as significantly decreased or resolved completely by discharge.  The patient reports that their mood is stable.  The patient denied having suicidal thoughts for more than 48 hours prior to discharge.  Patient denies having homicidal thoughts.  Patient denies having auditory hallucinations.  Patient denies any visual hallucinations or other symptoms of psychosis.  The patient was motivated to continue taking medication with a goal of continued improvement in mental health.   The patient reports their target psychiatric symptoms of anxiety responded well to the psychiatric medications, and the patient reports overall benefit other psychiatric hospitalization. Supportive psychotherapy was provided to the patient. The patient also participated  in regular group therapy while hospitalized. Coping skills, problem solving as well as relaxation therapies were also part of the unit programming.  Labs were reviewed with the patient, and abnormal results were discussed with the patient.  The patient is able to verbalize their individual safety plan to this provider.  # It is recommended to the patient to continue psychiatric medications as prescribed, after discharge from the hospital.    # It is recommended to the patient to follow up with your outpatient psychiatric provider and PCP.  # It was discussed with the patient, the impact of alcohol, drugs, tobacco have been there overall psychiatric and medical wellbeing, and total abstinence from substance use was recommended the patient.ed.  # Prescriptions provided or sent directly to preferred pharmacy at discharge. Patient agreeable to plan. Given opportunity to ask questions. Appears to feel comfortable with discharge.    # In the event of worsening symptoms, the patient is instructed to call the crisis hotline, 911 and or go to the nearest ED for appropriate evaluation and treatment of symptoms. To follow-up with primary care provider for other medical issues, concerns and or health care needs  # Patient was discharged home with a plan to follow up as noted below.    On day of discharge she feels anxious because of her hospitalization and wants to be with her children.  The patient reports that her sleep and appetite remain stable without significant changes. She acknowledges that her previous statements were taken out of context and recognizes the seriousness of making such comments. She expresses insight into how her words caused concern and states her commitment to avoid making similar statements in the future.  She denies any SI, HI, AVH, paranoia, and ideas of reference.   Physical Findings: AIMS:  , ,  ,  ,  ,  ,   CIWA:    COWS:     Musculoskeletal: Strength & Muscle Tone:  within normal limits Gait & Station: normal Patient leans: N/A   Psychiatric Specialty Exam:  Presentation  General Appearance:  Casual (tattoos on chest and arms, hair in a bonnet)  Eye Contact: Good  Speech: Clear and Coherent  Speech Volume: Normal  Handedness: Right   Mood and Affect  Mood: Anxious (I'm  anxious being here. I just want to go home with my kids)  Affect: Congruent   Thought Process  Thought Processes: Coherent  Descriptions of Associations:Intact  Orientation:Full (Time, Place and Person)  Thought Content:Logical  History of Schizophrenia/Schizoaffective disorder:No  Duration of Psychotic Symptoms:No data recorded Hallucinations:Hallucinations: None  Ideas of Reference:None  Suicidal Thoughts:Suicidal Thoughts: No  Homicidal Thoughts:Homicidal Thoughts: No   Sensorium  Memory: Immediate Good; Recent Good  Judgment: Poor  Insight: Good   Executive Functions  Concentration: Good  Attention Span: Good  Recall: Good  Fund of Knowledge: Good  Language: Good   Psychomotor Activity  Psychomotor Activity: Psychomotor Activity: Normal   Assets  Assets: Housing; Health and safety inspector; Desire for Improvement; Communication Skills; Physical Health; Resilience; Social Support   Sleep  Sleep: Sleep: Good  Estimated Sleeping Duration (Last 24 Hours): 6.75-8.25 hours   Physical Exam: Physical Exam Constitutional:      General: She is not in acute distress.    Appearance: Normal appearance. She is normal weight. She is not ill-appearing, toxic-appearing or diaphoretic.  HENT:     Head: Normocephalic and atraumatic.  Pulmonary:     Effort: Pulmonary effort is normal.  Neurological:     General: No focal deficit present.    Review of Systems  Respiratory:  Negative for cough.   Cardiovascular:  Negative for chest pain.  Gastrointestinal:  Negative for abdominal pain, constipation, diarrhea,  nausea and vomiting.  Psychiatric/Behavioral:  Negative for hallucinations and suicidal ideas. The patient is nervous/anxious.    Blood pressure (!) 122/91, pulse 85, temperature 98.2 F (36.8 C), temperature source Oral, resp. rate 16, height 5' 2 (1.575 m), weight 80.3 kg, SpO2 100%. Body mass index is 32.37 kg/m.   Social History   Tobacco Use  Smoking Status Former   Current packs/day: 0.00   Types: Cigarettes   Quit date: 09/17/2017   Years since quitting: 6.9  Smokeless Tobacco Never   Tobacco Cessation:  N/A, patient does not currently use tobacco products   Blood Alcohol level:  Lab Results  Component Value Date   ETH <15 08/15/2024    Metabolic Disorder Labs:  Lab Results  Component Value Date   HGBA1C 5.7 (H) 10/31/2017   No results found for: PROLACTIN No results found for: CHOL, TRIG, HDL, CHOLHDL, VLDL, LDLCALC  See Psychiatric Specialty Exam and Suicide Risk Assessment completed by Attending Physician prior to discharge.  Discharge destination:  Home  Is patient on multiple antipsychotic therapies at discharge:  No   Has Patient had three or more failed trials of antipsychotic monotherapy by history:  No  Recommended Plan for Multiple Antipsychotic Therapies: NA   Allergies as of 08/17/2024       Reactions   Bee Venom Swelling   Onion Other (See Comments)   Upset stomach   Pork-derived Products Other (See Comments)   Religious preference        Medication List    You have not been prescribed any medications.     Follow-up Information     [facility]. Schedule an appointment as soon as possible for a visit.   Why: You have a hospital follow up appointment for therapy and medication management services on  .  The appointment will be Virtual, telehealth. Contact information: [address]  [address] [place] [address] [phone]                 Follow-up recommendations/comments:  Activity: as  tolerated  Diet: heart healthy  Other: -  Follow-up with your outpatient psychiatric provider -instructions on appointment date, time, and address (location) are provided to you in discharge paperwork.  -Take your psychiatric medications as prescribed at discharge - instructions are provided to you in the discharge paperwork  -Follow-up with outpatient primary care doctor and other specialists -for management of chronic medical disease, including: N/A  -Testing: Follow-up with outpatient provider for abnormal lab results: N/A  -Recommend abstinence from alcohol, tobacco, and other illicit drug use at discharge.   -If your psychiatric symptoms recur, worsen, or if you have side effects to your psychiatric medications, call your outpatient psychiatric provider, 911, [phone] or go to the nearest emergency department.  -If suicidal thoughts recur, call your outpatient psychiatric provider, 911, [phone] or go to the nearest emergency department.   Signed: Alan Maiden, MD 08/17/2024, 11:16 AM

## 2024-08-17 NOTE — [facility] Suicide Risk Assessment (Signed)
 Suicide Risk Assessment  Admission Assessment    [facility] Admission Suicide Risk Assessment   Nursing information obtained from:  Patient Demographic factors:  Divorced or widowed Current Mental Status:  Suicidal ideation indicated by patient Loss Factors:  Loss of significant relationship Historical Factors:  Impulsivity Risk Reduction Factors:  Responsible for children under 30 years of age  Total Time spent with patient: 1 hour Principal Problem: Anxiety Diagnosis:  Principal Problem:   Anxiety  Subjective Data:   History of Present Illness:    CC:   I was too honest with the police   HPI: Brittany Bowers is a 27 y.o. female  with a past psychiatric history of GAD. Patient initially arrived to [facility] under IVC on 08/16/2024 for suicidal statements, and admitted to [facility] under IVC on 08/17/2024 for acute safety concerns. No significant past psychiatric history. Per IVC, respondent stated I am just tired but no one can take care of my kids like I can so I will take them out too.    Per IVC documentation, the patient reportedly stated, "I am just tired but no one can take care of my kids like I can so I will take them out too." However, per both the patient and her sister, the patient actually stated: "I am just tired of living, but no one can take care of my kids like I can, so I am not thinking about killing myself."  During the initial assessment on 08/17/2024, the patient stated that her comments scared her sister and were taken out of context. She explained that she had been on the phone with her sister while doing dishes after dinner. She felt overwhelmed due to multiple psychosocial stressors, including her son's upcoming homecoming event, an overdue car payment, and a difficult teacher-parent conference regarding her son's behavior. She reiterated that she had said she was tired of living but specifically denied intent to harm herself or her children.  She reported that after the  call went silent for several minutes, police arrived at her home for a wellness check. She was unaware of what her sister had told authorities, and repeated to them what she had told her sister. Her best friend, who lives nearby, came over during the encounter. When asked about firearms in the home, the patient handed over the only gun in the house to her best friend in front of police. She agreed to go to the hospital for evaluation and was subsequently admitted under IVC.  The patient reports ongoing stress due to her busy daily routine caring for her two children, ages 79 and 62, and her full-time employment at [employer]. She described waking at 5:30 a.m. to prepare the children for school, taking them to school herself due to bus schedule changes, then going directly to work. Afternoons are occupied with picking up her children and taking them to extracurricular activities. 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  She denies current suicidal ideation (SI), homicidal ideation (HI), auditory or visual hallucinations (AVH), paranoia, delusions, or ideas of reference. She denies significant changes in sleep or appetite and denies feelings of guilt or hopelessness. She acknowledges intermittent marijuana use at night after her children are in bed to manage stress.  She reports that she does not often reach out for support but recognizes the need to do so. She states she has supportive relationships with her sister, mother,  best friend, and neighbors, and is looking forward to starting therapy already arranged through her insurance.  Her psychiatric history includes only a diagnosis of postpartum anxiety; she has never been psychiatrically hospitalized or prescribed psychiatric medications. She denies any history of trauma, although she describes a distant relationship with her father  after age 38. She denies prior suicide attempts. She reports no firearms remain in the home.  While hospitalized, she has had multiple visits from her sister, mother, and best friend. She now expresses understanding that her comments were alarming but taken out of context. She is motivated to engage in therapy and to use her support system more effectively.   Collateral information via phone, Patient's Darin Kamille Toomey [phone]  Patient's sister, Yevette Knust, reports that the patient has been feeling significantly overwhelmed due to work, Herbalist, and the demands of raising two young children alone. Although Ms. Santiesteban tries to provide support, she resides in [place] and was unable to reach the patient in time during the recent incident.  Ms. Rubalcava works in parole and has extensive experience dealing with mental health concerns. She reports that during a recent phone conversation, the patient expressed feeling tired and overwhelmed. When the patient said, "I am tired, and I am trying not to unalive myself," Ms. Egner became alarmed. She stated that the patient's choice of words was surprising and triggering, especially in light of a past trauma. She attempted to calm the patient and initiated a conversation, but noted that the patient did not appear to take her concerns seriously. Ms. Comp emphasized to the patient that she was not joking and expressed worry for her safety.  Following this exchange, Ms. Haack contacted a crisis hotline. Crisis personnel engaged with the patient directly while Ms. Manard remained on the phone. The patient disclosed to crisis staff the same information she had shared with Ms. Culmer. This led to an involuntary commitment (IVC). The patient later acknowledged that her wording may have been poor and not fully reflective of her intent.  Ms. Blyth disclosed a personal history of witnessing a suicide attempt by their mother via overdose  on her (Ms. Vader) 15th birthday. She states that this history is not known to the patient, and their mother has never been formally diagnosed or treated for any psychiatric condition.  Ms. Wos describes the patient as a highly sensitive individual with a stable and successful career, currently under immense pressure. She reports that after spending time with the patient over the past two days, she believes the patient was reacting to acute stress and not expressing suicidal intent. Ms. Paolini believes the patient is safe for discharge and that the situation reflected a need for increased emotional support rather than a psychiatric crisis.  The sisters have discussed the importance of reaching out to support systems, including their sorority sisters. Counseling has been scheduled through the patient's insurance, and therapy is expected to begin shortly.  Ms. Risenhoover confirmed that there are no firearms in the patient's home. She also reiterated that aside from the incident involving their mother (who was never diagnosed or treated), there is no known family psychiatric history.    Continued Clinical Symptoms:  Alcohol Use Disorder Identification Test Final Score (AUDIT): 1 The Alcohol Use Disorders Identification Test, Guidelines for Use in Primary Care, Second Edition.  World Science writer [facility]). Score between 0-7:  no or low risk or alcohol related problems. Score between 8-15:  moderate risk of alcohol related problems. Score between  16-19:  high risk of alcohol related problems. Score 20 or above:  warrants further diagnostic evaluation for alcohol dependence and treatment.   CLINICAL FACTORS:   Severe Anxiety and/or Agitation   Musculoskeletal: Strength & Muscle Tone: within normal limits Gait & Station: normal Patient leans: N/A  Psychiatric Specialty Exam:  Presentation  General Appearance:  Casual (tattoos on chest and arms, hair in a bonnet)  Eye  Contact: Good  Speech: Clear and Coherent  Speech Volume: Normal  Handedness: Right   Mood and Affect  Mood: Anxious (I'm anxious being here. I just want to go home with my kids)  Affect: Congruent   Thought Process  Thought Processes: Coherent  Descriptions of Associations:Intact  Orientation:Full (Time, Place and Person)  Thought Content:Logical  History of Schizophrenia/Schizoaffective disorder:No  Duration of Psychotic Symptoms:No data recorded Hallucinations:Hallucinations: None  Ideas of Reference:None  Suicidal Thoughts:Suicidal Thoughts: No  Homicidal Thoughts:Homicidal Thoughts: No   Sensorium  Memory: Immediate Good; Recent Good  Judgment: Poor  Insight: Good   Executive Functions  Concentration: Good  Attention Span: Good  Recall: Good  Fund of Knowledge: Good  Language: Good   Psychomotor Activity  Psychomotor Activity: Psychomotor Activity: Normal   Assets  Assets: Housing; Health and safety inspector; Desire for Improvement; Communication Skills; Physical Health; Resilience; Social Support   Sleep  Sleep: Sleep: Good    Physical Exam: Physical Exam Constitutional:      General: She is not in acute distress.    Appearance: Normal appearance. She is normal weight. She is not ill-appearing, toxic-appearing or diaphoretic.  Pulmonary:     Effort: Pulmonary effort is normal.  Neurological:     General: No focal deficit present.     Mental Status: She is alert.    Review of Systems  Respiratory:  Negative for cough.   Cardiovascular:  Negative for chest pain.  Gastrointestinal:  Negative for abdominal pain, constipation, diarrhea, nausea and vomiting.  Neurological:  Negative for dizziness and headaches.  Psychiatric/Behavioral:  Negative for hallucinations and suicidal ideas. The patient is nervous/anxious.    Blood pressure (!) 122/91, pulse 85, temperature 98.2 F (36.8 C), temperature source  Oral, resp. rate 16, height 5' 2 (1.575 m), weight 80.3 kg, SpO2 100%. Body mass index is 32.37 kg/m.   COGNITIVE FEATURES THAT CONTRIBUTE TO RISK:  None    SUICIDE RISK:   Minimal: No identifiable suicidal ideation.  Patients presenting with no risk factors but with morbid ruminations; may be classified as minimal risk based on the severity of the depressive symptoms  PLAN OF CARE:  Assessment/Plan:   Brittany Bowers is a 28 year old woman with a history of generalized anxiety disorder who was admitted under IVC after reportedly making suicidal statements. On evaluation, the patient appears calm, cooperative, and future-oriented, with no evidence of acute psychosis, mania, or major depressive episode. She denies SI/HI/AVH and displays good insight into the events leading to her admission.  Collateral from the patient's sister supports that the statements made were out of context, with no history of prior suicide attempts or psychiatric hospitalizations. The patient has strong social supports, including her sister, mother, best friend, and neighbors. Therapy has already been arranged through her insurance. There are no firearms currently in the home.  Based on current evaluation, the patient does not meet criteria for continued involuntary commitment. She is not an imminent danger to herself or others and demonstrates the ability to engage in safety planning, use support systems, and follow up with outpatient care.We  will be planning on discharging patient today.   GAD - Trazodone 50 mg at bedtime as needed for insomnia - Atarax 25 mg TID as needed for anxiety - Agitation Protocol:    Safety and Monitoring: - Involuntary admission to inpatient psychiatric unit for safety, stabilization and treatment, will be rescinding IVC as patient is not a danger to self or others - Daily contact with patient to assess and evaluate symptoms and progress in treatment - Patient's case to be discussed  in multi-disciplinary team meeting - Observation Level : q15 minute checks - Vital signs:  q12 hours - Precautions: suicide, elopement, and assault   Other as needed medications  Tylenol  650 mg every 6 hours as needed for pain Mylanta 30 mL every 4 hours as needed for indigestion Milk of magnesia 30 mL daily as needed for constipation     The risks/benefits/side-effects/alternatives to the above medication were discussed in detail with the patient and time was given for questions. The patient consents to medication trial. FDA black box warnings, if present, were discussed.   The patient is agreeable with the medication plan, as above. We will monitor the patient's response to pharmacologic treatment, and adjust medications as necessary.   5.   Routine and other pertinent labs: EKG monitoring: QTc: Not released   Metabolism / endocrine: BMI: Body mass index is 32.37 kg/m.     Observation Level/Precautions:  15 minute checks  Laboratory:  CBC: unremarkable CMP: unremarkable UDS: marijuana positive Ethanol: <10 UA: unremarkable Pregnancy test: negative TSH: WNL  Psychotherapy:    Medications:    Consultations:    Discharge Concerns:    Estimated LOS: 08/17/2024 discharge date  Other:        6.   Group Therapy: - Encouraged patient to participate in unit milieu and in scheduled group therapies  - Short Term Goals: Ability to identify and develop effective coping behaviors will improve - Long Term Goals: Improvement in symptoms so as ready for discharge - Patient is encouraged to participate in group therapy while admitted to the psychiatric unit. - We will address other chronic and acute stressors, which contributed to the patient's Anxiety in order to reduce the risk of self-harm at discharge.   7.   Discharge Planning:  - Social work and case management to assist with discharge planning and identification of hospital follow-up needs prior to discharge - Estimated LOS: 1  day - Discharge Concerns: Need to establish a safety plan; Medication compliance and effectiveness - Discharge Goals: Return home with outpatient referrals for mental health follow-up including medication management/psychotherapy   I certify that inpatient services furnished can reasonably be expected to improve the patient's condition.   I certify that inpatient services furnished can reasonably be expected to improve the patient's condition.   Alan Maiden, MD 08/17/2024, 11:07 AM

## 2024-08-17 NOTE — Group Note (Signed)
 Recreation Therapy Group Note   Group Topic:Team Building  Group Date: 08/17/2024 Start Time: 0935 End Time: 1013 Facilitators: Tarika Mckethan-McCall, LRT,CTRS Location: [address] Dayroom   Group Topic: Communication, Team Building, Problem Solving  Goal Area(s) Addresses:  Patient will effectively work with peer towards shared goal.  Patient will identify skills used to make activity successful.  Patient will identify how skills used during activity can be used to reach post d/c goals.   Behavioral Response: Engaged  Intervention: STEM Activity  Activity: Straw Bridge. In teams of 3-5, patients were given 15 plastic drinking straws and an equal length of masking tape. Using the materials provided, patients were instructed to build a free standing bridge-like structure to suspend an everyday item (ex: puzzle box) off of the floor or table surface. All materials were required to be used by the team in their design. LRT facilitated post-activity discussion reviewing team process. Patients were encouraged to reflect how the skills used in this activity can be generalized to daily life post discharge.   Education: Pharmacist, community, Scientist, physiological, Discharge Planning   Education Outcome: Acknowledges education/In group clarification offered/Needs additional education.    Affect/Mood: Appropriate   Participation Level: Engaged   Participation Quality: Independent   Behavior: Appropriate   Speech/Thought Process: Focused   Insight: Good   Judgement: Good   Modes of Intervention: STEM Activity   Patient Response to Interventions:  Engaged   Education Outcome:  In group clarification offered    Clinical Observations/Individualized Feedback: Pt was engaged and worked hard with peers to complete project. Pt involved in creating and determining the structure of their bridge. Pt was focused throughout group.    Plan: Continue to engage patient in RT group sessions  2-3x/week.   Janiylah Hannis-McCall, LRT,CTRS 08/17/2024 11:11 AM

## 2024-08-17 NOTE — [facility] Suicide Risk Assessment (Signed)
 [facility] INPATIENT:  Family/Significant Other Suicide Prevention Education  Suicide Prevention Education:  Contact Attempts: Jazzmyne Waas (sister) [phone], (name of family member/significant other) has been identified by the patient as the family member/significant other with whom the patient will be residing, and identified as the person(s) who will aid the patient in the event of a mental health crisis.  With written consent from the patient, two attempts were made to provide suicide prevention education, prior to and/or following the patient's discharge.  We were unsuccessful in providing suicide prevention education.  A suicide education pamphlet was given to the patient to share with family/significant other.  Date and time of first attempt:08/17/24 / 11:00 am Date and time of second attempt: 08/17/24 / 11:30 am  [name] 08/17/2024, 11:29 AM

## 2024-08-22 ENCOUNTER — Telehealth (INDEPENDENT_AMBULATORY_CARE_PROVIDER_SITE_OTHER): Admitting: Obstetrics and Gynecology

## 2024-08-22 DIAGNOSIS — L739 Follicular disorder, unspecified: Secondary | ICD-10-CM | POA: Diagnosis not present

## 2024-08-22 DIAGNOSIS — Z0000 Encounter for general adult medical examination without abnormal findings: Secondary | ICD-10-CM

## 2024-08-22 NOTE — Patient Instructions (Addendum)
 [email or web address]

## 2024-08-22 NOTE — Progress Notes (Signed)
 GYNECOLOGY TELEPHONE ENCOUNTER NOTE  Provider location: Center for [facility]   Patient location: Home  I connected with Brittany Bowers on 08/22/24 at  8:35 AM EDT by telephone. Unable to connect via MyChart due to patient's connection so telephone encounter completed   I discussed the limitations, risks, security and privacy concerns of performing an evaluation and management service virtually and the availability of in person appointments. I also discussed with the patient that there may be a patient responsible charge related to this service. The patient expressed understanding and agreed to proceed.   History:  Brittany Bowers is a 28 y.o. [phone] female being evaluated today for follow up of the hair hair has regrown - came to a head and when it popped there was a hair.applied aloe vera and hasn't had issues otherwise. Remaining concern is whether or not razors can cause hair bumps. Dove sensitive does not cause issues.      Past Medical History:  Diagnosis Date   Asthma    uses inhaler twice/day   Past Surgical History:  Procedure Laterality Date   TOE SURGERY Bilateral    The following portions of the patient's history were reviewed and updated as appropriate: allergies, current medications, past family history, past medical history, past social history, past surgical history and problem list.   Health Maintenance:      Component Value Date/Time   DIAGPAP  06/02/2023 1000    - Negative for intraepithelial lesion or malignancy (NILM)   DIAGPAP  10/31/2017 0000    NEGATIVE FOR INTRAEPITHELIAL LESIONS OR MALIGNANCY.   ADEQPAP  06/02/2023 1000    Satisfactory for evaluation; transformation zone component PRESENT.   ADEQPAP  10/31/2017 0000    Satisfactory for evaluation  endocervical/transformation zone component PRESENT.     Review of Systems:  Pertinent items noted in HPI and remainder of comprehensive ROS otherwise  negative.  Physical Exam:   General:  Alert, oriented and cooperative. Patient appears to be in no acute distress.  Mental Status: Normal mood and affect. Normal behavior. Normal judgment and thought content.   Respiratory: Normal respiratory effort, no problems with respiration noted  Rest of physical exam deferred due to type of encounter    Assessment and Plan:     1. Folliculitis (Primary) Resolved s/p cessation of shaving. Discussed that vulvar hair removal not needed. Can continue to use dove sensitive soap since that appears to not cause issues with her own skin. If hair removal is desired, can either proceed with clippers that don't cut the skin and simply cut the hair shorter or use exfoliant  2. Encounter for general health examination Does not have a regular PCP - noted that I am not a PCP and only provide gyn services, referral placed to PCP and listed website to view additional options for providers.  Can keep annual visit with gyn though noted that pap is currently up to date and that contraception is still viable since IUD placed last year - Ambulatory Referral to Primary Care       I discussed the assessment and treatment plan with the patient. The patient was provided an opportunity to ask questions and all were answered. The patient agreed with the plan and demonstrated an understanding of the instructions.   The patient was advised to call back or seek an in-person evaluation/go to the ED if the symptoms worsen or if the condition fails to improve as anticipated.  I provided  5 minutes of face-to-face time during this encounter. I also spent 5 minutes dedicated to the care of this patient including pre-visit review of records, post visit ordering of medications and appropriate tests or procedures, coordinating care and documenting this visit encounter.    Carter Quarry, MD Center for [employer], [facility]

## 2024-08-24 NOTE — BH Specialist Note (Signed)
 Integrated Behavioral Health via Telemedicine Visit  08/27/2024 Brittany Bowers [id number]  Number of Integrated Behavioral Health Clinician visits: 2- Second Visit  Session Start time: 0815   Session End time: 0829  Total time in minutes: 14    Referring Provider: Barabara Maier, DO Patient/Family location: Home [facility] Provider location: [facility] for [facility]  All persons participating in visit: Patient Brittany Bowers and [facility]ool[facility]   Types of Service: Individual psychotherapy and Video visit  I connected with Brittany Bowers and/or Brittany Bowers's n/a via  Telephone or Video Enabled Telemedicine Application  (Video is Caregility application) and verified that I am speaking with the correct person using two identifiers. Discussed confidentiality: Yes   I discussed the limitations of telemedicine and the availability of in person appointments.  Discussed there is a possibility of technology failure and discussed alternative modes of communication if that failure occurs.  I discussed that engaging in this telemedicine visit, they consent to the provision of behavioral healthcare and the services will be billed under their insurance.  Patient and/or legal guardian expressed understanding and consented to Telemedicine visit: Yes   Presenting Concerns: Patient and/or family reports the following symptoms/concerns: Ongoing anxiety with brief SI that recently lead to IVC and BH hospitalization; pt using relaxation breathing exercises and outdoor walks to cope, as well as additional support from mom and sisters after hospitalization.  Duration of problem: Ongoing; Severity of problem: moderately severe  Patient and/or Family's Strengths/Protective Factors: Concrete supports in place (healthy food, safe environments, etc.) and Sense of purpose  Goals Addressed: Patient will:  Reduce symptoms of: anxiety, depression, and stress     Demonstrate ability to: Increase healthy adjustment to current life circumstances and Increase motivation to adhere to plan of care  Progress towards Goals: Ongoing    Interventions: Interventions utilized:  Supportive Reflection Standardized Assessments completed: Not Needed    Patient and/or Family Response: Patient agrees with treatment plan.   Clinical Assessment/Diagnosis  Generalized anxiety disorder with panic attacks   Patient may benefit from psychoeducation and brief therapeutic interventions regarding coping with symptoms of anxiety  and depression .  Plan: Follow up with behavioral health clinician on : Call Jannelly Bergren at [phone], as needed. Behavioral recommendations:  -Attend [facility] initial appointment today -Continue using self-coping strategies daily (relaxation breathing, outdoor walks, limiting loud noises;  allowing supportive people in life to help as needed) Referral(s): Community Mental Health Services (LME/Outside Clinic)  I discussed the assessment and treatment plan with the patient and/or parent/guardian. They were provided an opportunity to ask questions and all were answered. They agreed with the plan and demonstrated an understanding of the instructions.   They were advised to call back or seek an in-person evaluation if the symptoms worsen or if the condition fails to improve as anticipated.  Warren JAYSON Mering, LCSW     08/02/2024    3:49 PM 06/02/2023   10:04 AM 08/28/2018    4:58 PM 04/27/2018    4:42 PM 04/14/2018    4:38 PM  Depression screen PHQ 2/9  Decreased Interest 1 1 0 1 1  Down, Depressed, Hopeless 2 0 0 1 2  PHQ - 2 Score 3 1 0 2 3  Altered sleeping 0 2 1 1 1   Tired, decreased energy 1 2 0 1 1  Change in appetite 1 0 0 1 2  Feeling bad or failure about yourself  1 0 0 0 0  Trouble concentrating 2 0 0 1 1  Moving slowly or fidgety/restless 2 0 0 0 0  Suicidal thoughts 0 0 0 0 0  PHQ-9 Score 10 5 1 6 8       08/02/2024     3:49 PM 06/02/2023   10:04 AM 08/28/2018    4:58 PM 04/27/2018    4:42 PM  GAD 7 : Generalized Anxiety Score  Nervous, Anxious, on Edge 3 2 1 1   Control/stop worrying 3 2 0 1  Worry too much - different things 3 2 1 1   Trouble relaxing 3 2 1 1   Restless 3 1 0   Easily annoyed or irritable 3 2 0 1  Afraid - awful might happen 3 1 0 1  Total GAD 7 Score 21 12 3

## 2024-08-27 ENCOUNTER — Ambulatory Visit: Admitting: Clinical

## 2024-08-27 DIAGNOSIS — F4310 Post-traumatic stress disorder, unspecified: Secondary | ICD-10-CM | POA: Diagnosis not present

## 2024-08-27 DIAGNOSIS — F330 Major depressive disorder, recurrent, mild: Secondary | ICD-10-CM | POA: Diagnosis not present

## 2024-08-27 DIAGNOSIS — F410 Panic disorder [episodic paroxysmal anxiety] without agoraphobia: Secondary | ICD-10-CM

## 2024-09-12 ENCOUNTER — Telehealth: Admitting: Obstetrics and Gynecology

## 2024-09-14 DIAGNOSIS — F4310 Post-traumatic stress disorder, unspecified: Secondary | ICD-10-CM | POA: Diagnosis not present

## 2024-09-20 ENCOUNTER — Ambulatory Visit (INDEPENDENT_AMBULATORY_CARE_PROVIDER_SITE_OTHER): Admitting: Obstetrics and Gynecology

## 2024-09-20 ENCOUNTER — Other Ambulatory Visit (HOSPITAL_COMMUNITY)
Admission: RE | Admit: 2024-09-20 | Discharge: 2024-09-20 | Disposition: A | Discharge status: Home / Self Care | Source: Ambulatory Visit | Attending: Obstetrics and Gynecology | Admitting: Obstetrics and Gynecology

## 2024-09-20 ENCOUNTER — Encounter: Payer: Self-pay | Admitting: Obstetrics and Gynecology

## 2024-09-20 VITALS — BP 132/101 | HR 94 | Wt 180.0 lb

## 2024-09-20 DIAGNOSIS — Z01419 Encounter for gynecological examination (general) (routine) without abnormal findings: Secondary | ICD-10-CM | POA: Insufficient documentation

## 2024-09-20 DIAGNOSIS — Z113 Encounter for screening for infections with a predominantly sexual mode of transmission: Secondary | ICD-10-CM | POA: Insufficient documentation

## 2024-09-20 DIAGNOSIS — I10 Essential (primary) hypertension: Secondary | ICD-10-CM | POA: Diagnosis not present

## 2024-09-20 MED ORDER — AMLODIPINE BESYLATE 5 MG PO TABS: 5.0000 mg | ORAL_TABLET | Freq: Every day | ORAL | 3 refills | Status: DC

## 2024-09-20 NOTE — Progress Notes (Signed)
 ANNUAL EXAM Patient name: Brittany Bowers MRN [id number]  Date of birth: November 19, 1995 Chief Complaint:   Gynecologic Exam  History of Present Illness:   Brittany Bowers is a 28 y.o. [id number] being seen today for a routine annual exam.  Current complaints: pap  Menstrual concerns? No   Breast or nipple changes? No  Contraception use? Yes IUD in place Sexually active? Yes   Not previously on blood pressure medication. Denies chest pain and SOB, endorses intermittent headaches.   No LMP recorded. (Menstrual status: IUD).   The pregnancy intention screening data noted above was reviewed. Potential methods of contraception were discussed. The patient elected to proceed with No data recorded.   Last pap     Component Value Date/Time   DIAGPAP  06/02/2023 1000    - Negative for intraepithelial lesion or malignancy (NILM)   DIAGPAP  10/31/2017 0000    NEGATIVE FOR INTRAEPITHELIAL LESIONS OR MALIGNANCY.   ADEQPAP  06/02/2023 1000    Satisfactory for evaluation; transformation zone component PRESENT.   ADEQPAP  10/31/2017 0000    Satisfactory for evaluation  endocervical/transformation zone component PRESENT.    Last mammogram: n/a.  Last colonoscopy: n/a.      09/20/2024    4:03 PM 08/02/2024    3:49 PM 06/02/2023   10:04 AM 08/28/2018    4:58 PM 04/27/2018    4:42 PM  Depression screen PHQ 2/9  Decreased Interest 2 1 1  0 1  Down, Depressed, Hopeless 0 2 0 0 1  PHQ - 2 Score 2 3 1  0 2  Altered sleeping 0 0 2 1 1   Tired, decreased energy 2 1 2  0 1  Change in appetite 0 1 0 0 1  Feeling bad or failure about yourself  0 1 0 0 0  Trouble concentrating 2 2 0 0 1  Moving slowly or fidgety/restless 0 2 0 0 0  Suicidal thoughts 0 0 0 0 0  PHQ-9 Score 6 10  5  1  6       Data saved with a previous flowsheet row definition        09/20/2024    4:03 PM 08/02/2024    3:49 PM 06/02/2023   10:04 AM 08/28/2018    4:58 PM  GAD 7 : Generalized Anxiety Score  Nervous, Anxious, on  Edge 3 3 2 1   Control/stop worrying 2 3 2  0  Worry too much - different things 3 3 2 1   Trouble relaxing 2 3 2 1   Restless 2 3 1  0  Easily annoyed or irritable 3 3 2  0  Afraid - awful might happen 2 3 1  0  Total GAD 7 Score 17 21 12 3      Review of Systems:   Pertinent items are noted in HPI Denies any headaches, blurred vision, fatigue, shortness of breath, chest pain, abdominal pain, abnormal vaginal discharge/itching/odor/irritation, problems with periods, bowel movements, urination, or intercourse unless otherwise stated above. Pertinent History Reviewed:  Reviewed past medical,surgical, social and family history.  Reviewed problem list, medications and allergies. Physical Assessment:   Vitals:   09/20/24 1602  BP: (!) 148/103  Pulse: 94  Weight: 180 lb (81.6 kg)  Body mass index is 32.92 kg/m.        Physical Examination:   General appearance - well appearing, and in no distress  Mental status - alert, oriented to person, place, and time  Psych:  She has a normal mood and affect  Skin -  warm and dry, normal color, no suspicious lesions noted  Chest - effort normal, all lung fields clear to auscultation bilaterally  Heart - normal rate and regular rhythm  Breasts - breasts appear normal, no suspicious masses, no skin or nipple changes or  axillary nodes  Abdomen - soft, nontender, nondistended, no masses or organomegaly  Pelvic -  VULVA: normal appearing vulva with no masses, tenderness or lesions   VAGINA: normal appearing vagina with normal color and discharge, no lesions   CERVIX: normal appearing cervix without discharge or lesions, no CMT  Thin prep pap is done with HR HPV cotesting  UTERUS: uterus is felt to be normal size, shape, consistency and nontender   ADNEXA: No adnexal masses or tenderness noted.  Extremities:  No swelling or varicosities noted  Chaperone present for exam  No results found for this or any previous visit (from the past 24 hours).     Assessment & Plan:  1. Well woman exam with routine gynecological exam (Primary) - Cervical cancer screening: Discussed guidelines. Pap with HPV collected - GC/CT: accepts - Birth Control: IUD - Breast Health: Encouraged self breast awareness/SBE. Teaching provided.  - F/U 12 months and prn  - Cervicovaginal ancillary only( [place]) - RPR+HBsAg+HCVAb+...  2. Screening examination for STI - Cervicovaginal ancillary only( [place]) - RPR+HBsAg+HCVAb+...  3. Primary hypertension Reviewed concern with significantly elevated blood pressure. Noted short and long term repercussions for uncontrolled blood pressures. Noted hypertension emergency symptoms that should prompt immediate evaluation. In the interim, will start low dose CCB for HTN until able to be seen by PCP.  - amLODipine (NORVASC) 5 MG tablet; Take 1 tablet (5 mg total) by mouth daily.  Dispense: 30 tablet; Refill: 3   Orders Placed This Encounter  Procedures   RPR+HBsAg+HCVAb+...    Meds:  Meds ordered this encounter  Medications   amLODipine (NORVASC) 5 MG tablet    Sig: Take 1 tablet (5 mg total) by mouth daily.    Dispense:  30 tablet    Refill:  3    Follow-up: No follow-ups on file.  Carter Quarry, MD 09/20/2024 4:30 PM

## 2024-09-21 ENCOUNTER — Ambulatory Visit: Payer: Self-pay | Admitting: Obstetrics and Gynecology

## 2024-09-21 LAB — CERVICOVAGINAL ANCILLARY ONLY
Candida Glabrata: NEGATIVE
Candida Vaginitis: NEGATIVE
Chlamydia: NEGATIVE
Comment: NEGATIVE
Comment: NEGATIVE
Comment: NEGATIVE
Comment: NEGATIVE
Comment: NORMAL
Neisseria Gonorrhea: NEGATIVE
Trichomonas: NEGATIVE

## 2024-09-21 LAB — RPR+HBSAG+HCVAB+...
HIV Screen 4th Generation wRfx: NONREACTIVE
Hep C Virus Ab: NONREACTIVE
Hepatitis B Surface Ag: NEGATIVE
RPR Ser Ql: NONREACTIVE

## 2024-11-27 ENCOUNTER — Ambulatory Visit (INDEPENDENT_AMBULATORY_CARE_PROVIDER_SITE_OTHER): Admitting: Nurse Practitioner

## 2024-11-27 ENCOUNTER — Encounter: Payer: Self-pay | Admitting: Nurse Practitioner

## 2024-11-27 VITALS — BP 144/96 | HR 80 | Temp 98.1°F | Wt 179.0 lb

## 2024-11-27 DIAGNOSIS — R200 Anesthesia of skin: Secondary | ICD-10-CM | POA: Insufficient documentation

## 2024-11-27 DIAGNOSIS — Z0000 Encounter for general adult medical examination without abnormal findings: Secondary | ICD-10-CM | POA: Diagnosis not present

## 2024-11-27 DIAGNOSIS — F411 Generalized anxiety disorder: Secondary | ICD-10-CM | POA: Diagnosis not present

## 2024-11-27 DIAGNOSIS — I10 Essential (primary) hypertension: Secondary | ICD-10-CM | POA: Diagnosis not present

## 2024-11-27 MED ORDER — SERTRALINE HCL 50 MG PO TABS: 50.0000 mg | ORAL_TABLET | Freq: Every day | ORAL | 3 refills | Status: AC

## 2024-11-27 MED ORDER — AMLODIPINE BESYLATE 5 MG PO TABS: 5.0000 mg | ORAL_TABLET | Freq: Every day | ORAL | 3 refills | Status: AC

## 2024-11-27 NOTE — Progress Notes (Signed)
 "  New Patient Office Visit  Subjective:  Patient ID: Brittany Bowers, female    DOB: Mar 19, 1996  Age: 29 y.o. MRN: [id number]  CC:  Chief Complaint  Patient presents with   New Patient (Initial Visit)    [facility] started BP meds. Went to mental clinic whom mentioned Zoloft  but never prescribed it.     HPI    Discussed the use of AI scribe software for clinical note transcription with the patient, who gave verbal consent to proceed.  History of Present Illness Brittany Bowers is a 29 year old female with hypertension and anxiety who presents for management of her blood pressure and anxiety symptoms.  She has a history of hypertension and was prescribed amlodipine  5 mg daily in November. She inconsistently takes the medication, often forgetting it, and experiences headaches even when she does take it. She reports that when she checks her blood pressure at home, her systolic is usually between 130 and 140 mmHg or higher, and her diastolic is usually between 97 and 100 mmHg or higher. She did not take her medication on the day of the visit but usually takes it around her lunch break.  She experiences significant anxiety due to personal stressors, including the incarceration of her children's father and recent job loss. Her anxiety manifests as panic attacks, making social activities difficult. She was hospitalized for mental health concerns in October, where her high blood pressure was first noted. She has not been on medication for anxiety and has missed therapy appointments due to scheduling conflicts.  She lives with her two children, aged six and nine, and uses marijuana occasionally, which she is trying to quit. She does not smoke tobacco and has not consumed alcohol in the past week. 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  Family history is significant for hypertension in her mother and stroke and cancer in her grandmother. No chest pain, shortness of breath, or  leg swelling. Reports headaches and numbness in hands.  Assessment & Plan    Past Medical History:  Diagnosis Date   Asthma    uses inhaler twice/day    Past Surgical History:  Procedure Laterality Date   TOE SURGERY Bilateral     Family History  Problem Relation Age of Onset   Hypertension Mother    Diabetes Father    Cancer Maternal Aunt    Stroke Maternal Aunt    Diabetes Maternal Grandmother    Stroke Maternal Grandmother        TIA   Cancer Maternal Grandfather    Alcohol abuse Neg Hx    Arthritis Neg Hx    Asthma Neg Hx    Birth defects Neg Hx    COPD Neg Hx    Depression Neg Hx    Drug abuse Neg Hx    Early death Neg Hx    Hearing loss Neg Hx    Heart disease Neg Hx    Hyperlipidemia Neg Hx    Kidney disease Neg Hx    Learning disabilities Neg Hx    Mental illness Neg Hx    Mental retardation Neg Hx    Miscarriages / Stillbirths Neg Hx    Vision loss Neg Hx    Varicose Veins Neg Hx     Social History   Socioeconomic History   Marital status: Single    Spouse name: Not on file   Number of children: 2   Years of education: Not on  file   Highest education level: Associate degree: occupational, scientist, product/process development, or vocational program  Occupational History   Not on file  Tobacco Use   Smoking status: Former    Current packs/day: 0.00    Average packs/day: 0.5 packs/day    Types: Cigarettes    Quit date: 09/17/2017    Years since quitting: 7.2   Smokeless tobacco: Never  Substance and Sexual Activity   Alcohol use: Not Currently    Comment: occational   Drug use: Yes    Types: Marijuana    Comment: last use 3 feb 19   Sexual activity: Yes    Birth control/protection: None  Other Topics Concern   Not on file  Social History Narrative   Lives with her children    Social Drivers of Health   Tobacco Use: Medium Risk (11/27/2024)   Patient History    Smoking Tobacco Use: Former    Smokeless Tobacco Use: Never    Passive Exposure: Not on file   Financial Resource Strain: Low Risk (11/26/2024)   Overall Financial Resource Strain (CARDIA)    Difficulty of Paying Living Expenses: Not very hard  Food Insecurity: No Food Insecurity (11/27/2024)   Epic    Worried About Programme Researcher, Broadcasting/film/video in the Last Year: Never true    Ran Out of Food in the Last Year: Never true  Transportation Needs: No Transportation Needs (11/27/2024)   Epic    Lack of Transportation (Medical): No    Lack of Transportation (Non-Medical): No  Physical Activity: Inactive (11/26/2024)   Exercise Vital Sign    Days of Exercise per Week: 0 days    Minutes of Exercise per Session: Not on file  Stress: No Stress Concern Present (11/26/2024)   [employer] of Occupational Health - Occupational Stress Questionnaire    Feeling of Stress: Only a little  Social Connections: Moderately Isolated (11/26/2024)   Social Connection and Isolation Panel    Frequency of Communication with Friends and Family: More than three times a week    Frequency of Social Gatherings with Friends and Family: More than three times a week    Attends Religious Services: More than 4 times per year    Active Member of Clubs or Organizations: No    Attends Banker Meetings: Not on file    Marital Status: Never married  Intimate Partner Violence: Not At Risk (11/27/2024)   Epic    Fear of Current or Ex-Partner: No    Emotionally Abused: No    Physically Abused: No    Sexually Abused: No  Depression (PHQ2-9): Low Risk (11/27/2024)   Depression (PHQ2-9)    PHQ-2 Score: 4  Recent Concern: Depression (PHQ2-9) - Medium Risk (09/20/2024)   Depression (PHQ2-9)    PHQ-2 Score: 6  Alcohol Screen: Medium Risk (11/26/2024)   Alcohol Screen    Last Alcohol Screening Score (AUDIT): 8  Housing: Low Risk (11/27/2024)   Epic    Unable to Pay for Housing in the Last Year: No    Number of Times Moved in the Last Year: 0    Homeless in the Last Year: No  Utilities: Not At Risk (11/27/2024)   Epic     Threatened with loss of utilities: No  Health Literacy: Not on file    ROS Review of Systems  Constitutional:  Negative for appetite change, chills, fatigue and fever.  HENT:  Negative for congestion, postnasal drip, rhinorrhea and sneezing.   Respiratory:  Negative for cough, shortness  of breath and wheezing.   Cardiovascular:  Negative for chest pain, palpitations and leg swelling.  Gastrointestinal:  Negative for abdominal pain, constipation, nausea and vomiting.  Genitourinary:  Negative for difficulty urinating, dysuria, flank pain and frequency.  Musculoskeletal:  Negative for arthralgias, back pain, joint swelling and myalgias.  Skin:  Negative for color change, pallor, rash and wound.  Neurological:  Positive for headaches. Negative for dizziness, facial asymmetry, weakness and numbness.  Psychiatric/Behavioral:  Negative for behavioral problems, confusion, self-injury and suicidal ideas.     Objective:   Today's Vitals: BP (!) 144/96   Pulse 80   Temp 98.1 F (36.7 C) (Temporal)   Wt 179 lb (81.2 kg)   SpO2 100%   BMI 32.74 kg/m   Physical Exam Vitals and nursing note reviewed.  Constitutional:      General: She is not in acute distress.    Appearance: Normal appearance. She is obese. She is not ill-appearing, toxic-appearing or diaphoretic.  HENT:     Mouth/Throat:     Mouth: Mucous membranes are moist.     Pharynx: Oropharynx is clear. No oropharyngeal exudate or posterior oropharyngeal erythema.  Eyes:     General: No scleral icterus.       Right eye: No discharge.        Left eye: No discharge.     Extraocular Movements: Extraocular movements intact.     Conjunctiva/sclera: Conjunctivae normal.  Cardiovascular:     Rate and Rhythm: Normal rate and regular rhythm.     Pulses: Normal pulses.     Heart sounds: Normal heart sounds. No murmur heard.    No friction rub. No gallop.  Pulmonary:     Effort: Pulmonary effort is normal. No respiratory distress.      Breath sounds: Normal breath sounds. No stridor. No wheezing, rhonchi or rales.  Chest:     Chest wall: No tenderness.  Abdominal:     General: There is no distension.     Palpations: Abdomen is soft.     Tenderness: There is no abdominal tenderness. There is no right CVA tenderness, left CVA tenderness or guarding.  Musculoskeletal:        General: No swelling, tenderness, deformity or signs of injury.     Right lower leg: No edema.     Left lower leg: No edema.  Skin:    General: Skin is warm and dry.     Capillary Refill: Capillary refill takes less than 2 seconds.     Coloration: Skin is not jaundiced or pale.     Findings: No bruising, erythema or lesion.  Neurological:     Mental Status: She is alert and oriented to person, place, and time.     Motor: No weakness.     Coordination: Coordination normal.     Gait: Gait normal.  Psychiatric:        Mood and Affect: Mood normal.        Behavior: Behavior normal.        Thought Content: Thought content normal.        Judgment: Judgment normal.     Assessment & Plan:   Problem List Items Addressed This Visit       Cardiovascular and Mediastinum   Primary hypertension   DASH diet and commitment to daily physical activity for a minimum of 30 minutes discussed and encouraged, as a part of hypertension management. The importance of attaining a healthy weight is also discussed.  11/27/2024   11:06 AM 11/27/2024   10:58 AM 09/20/2024    4:35 PM 09/20/2024    4:02 PM 08/17/2024    6:13 AM 08/16/2024    9:40 PM 08/16/2024    4:44 PM  BP/Weight  Systolic BP 144 141 132 148 122 160 164  Diastolic BP 96 97 101 103 91 116 106  Wt. (Lbs)  179  180     BMI  32.74 kg/m2  32.92 kg/m2      Blood pressure readings indicate suboptimal control with inconsistent amlodipine  adherence. Headaches persist. Family history noted. . - Continue amlodipine  5 mg daily. - Encouraged consistent daily medication adherence. - Ordered home  blood pressure monitor. - Encouraged heart-healthy, low salt, low fat diet. - Encouraged moderate to vigorous exercise 5 days a week. - Will reassess blood pressure control in 4 weeks.       Relevant Medications   amLODipine  (NORVASC ) 5 MG tablet     Other   GAD (generalized anxiety disorder) - Primary      11/27/2024   11:02 AM 09/20/2024    4:03 PM 08/02/2024    3:49 PM 06/02/2023   10:04 AM  GAD 7 : Generalized Anxiety Score  Nervous, Anxious, on Edge 1 3 3 2   Control/stop worrying 1 2 3 2   Worry too much - different things 1 3 3 2   Trouble relaxing 1 2 3 2   Restless 3 2 3 1   Easily annoyed or irritable 2 3 3 2   Afraid - awful might happen 3 2 3 1   Total GAD 7 Score 12 17 21 12   Anxiety Difficulty Very difficult       Anxiety worsened by job loss and stressors. Panic attacks and social withdrawal present. No suicidal ideation. Previous therapy inconsistent. Discussed Zoloft  and therapy benefits. - Prescribed Zoloft  50 mg daily. - Encouraged continuation of therapy sessions. - Discussed potential for dose adjustment if symptoms persist after 4-6 weeks.       Relevant Medications   sertraline  (ZOLOFT ) 50 MG tablet   Health care maintenance   General Health Maintenance Flu vaccine received. HPV vaccine series incomplete.  Hepatitis B vaccine  and TD vaccine current. Advised to get HPV vaccine at the pharmacy         Numbness   Works as a LAWYER, has intermittent numbness in the hands, could be from overuse of both hands Recent CBC was normal Will screen for B12 deficiency at next visit        Outpatient Encounter Medications as of 11/27/2024  Medication Sig   sertraline  (ZOLOFT ) 50 MG tablet Take 1 tablet (50 mg total) by mouth daily.   [DISCONTINUED] amLODipine  (NORVASC ) 5 MG tablet Take 1 tablet (5 mg total) by mouth daily.   amLODipine  (NORVASC ) 5 MG tablet Take 1 tablet (5 mg total) by mouth daily.   No facility-administered encounter medications on file as  of 11/27/2024.    Follow-up: Return in about 4 weeks (around 12/25/2024) for HTN, ANXIETY.   Quinto Tippy R Jaziah Goeller, FNP "

## 2024-11-27 NOTE — Patient Instructions (Signed)
 1. GAD (generalized anxiety disorder) (Primary) - sertraline  (ZOLOFT ) 50 MG tablet; Take 1 tablet (50 mg total) by mouth daily.  Dispense: 60 tablet; Refill: 3  2. Primary hypertension - amLODipine  (NORVASC ) 5 MG tablet; Take 1 tablet (5 mg total) by mouth daily.  Dispense: 60 tablet; Refill: 3    It is important that you exercise regularly at least 30 minutes 5 times a week as tolerated  Think about what you will eat, plan ahead. Choose  clean, green, fresh or frozen over canned, processed or packaged foods which are more sugary, salty and fatty. 70 to 75% of food eaten should be vegetables and fruit. Three meals at set times with snacks allowed between meals, but they must be fruit or vegetables. Aim to eat over a 12 hour period , example 7 am to 7 pm, and STOP after  your last meal of the day. Drink water,generally about 64 ounces per day, no other drink is as healthy. Fruit juice is best enjoyed in a healthy way, by EATING the fruit.  Thanks for choosing [facility] we consider it a privelige to serve you.

## 2024-11-27 NOTE — Assessment & Plan Note (Signed)
 General Health Maintenance Flu vaccine received. HPV vaccine series incomplete.  Hepatitis B vaccine  and TD vaccine current. Advised to get HPV vaccine at the pharmacy

## 2024-11-27 NOTE — Assessment & Plan Note (Signed)
" °    11/27/2024   11:02 AM 09/20/2024    4:03 PM 08/02/2024    3:49 PM 06/02/2023   10:04 AM  GAD 7 : Generalized Anxiety Score  Nervous, Anxious, on Edge 1 3 3 2   Control/stop worrying 1 2 3 2   Worry too much - different things 1 3 3 2   Trouble relaxing 1 2 3 2   Restless 3 2 3 1   Easily annoyed or irritable 2 3 3 2   Afraid - awful might happen 3 2 3 1   Total GAD 7 Score 12 17 21 12   Anxiety Difficulty Very difficult       Anxiety worsened by job loss and stressors. Panic attacks and social withdrawal present. No suicidal ideation. Previous therapy inconsistent. Discussed Zoloft  and therapy benefits. - Prescribed Zoloft  50 mg daily. - Encouraged continuation of therapy sessions. - Discussed potential for dose adjustment if symptoms persist after 4-6 weeks.  "

## 2024-11-27 NOTE — Assessment & Plan Note (Addendum)
 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

## 2024-11-27 NOTE — Assessment & Plan Note (Signed)
 DASH diet and commitment to daily physical activity for a minimum of 30 minutes discussed and encouraged, as a part of hypertension management. The importance of attaining a healthy weight is also discussed.     11/27/2024   11:06 AM 11/27/2024   10:58 AM 09/20/2024    4:35 PM 09/20/2024    4:02 PM 08/17/2024    6:13 AM 08/16/2024    9:40 PM 08/16/2024    4:44 PM  BP/Weight  Systolic BP 144 141 132 148 122 160 164  Diastolic BP 96 97 101 103 91 116 106  Wt. (Lbs)  179  180     BMI  32.74 kg/m2  32.92 kg/m2      Blood pressure readings indicate suboptimal control with inconsistent amlodipine  adherence. Headaches persist. Family history noted. . - Continue amlodipine  5 mg daily. - Encouraged consistent daily medication adherence. - Ordered home blood pressure monitor. - Encouraged heart-healthy, low salt, low fat diet. - Encouraged moderate to vigorous exercise 5 days a week. - Will reassess blood pressure control in 4 weeks.

## 2024-11-28 ENCOUNTER — Other Ambulatory Visit: Payer: Self-pay

## 2024-11-28 DIAGNOSIS — I10 Essential (primary) hypertension: Secondary | ICD-10-CM

## 2024-11-28 MED ORDER — OMRON 3 SERIES BP MONITOR DEVI: 1.0000 | 0 refills | Status: AC

## 2024-12-21 ENCOUNTER — Telehealth: Payer: Self-pay | Admitting: Nurse Practitioner

## 2024-12-21 NOTE — Telephone Encounter (Signed)
 Copied from CRM #[id number]. Topic: General - Other >> Dec 18, 2024  8:14 AM Treva T wrote: Reason for CRM: Received call from [facility] with [facility] delivered, calling inquiring if paperwork for blood pressure monitor were received at office  faxed on yesterday, 12/17/24.  Caller requesting a return call, at [phone] to confirm if received.

## 2024-12-21 NOTE — Telephone Encounter (Signed)
 Returned to Home care delivery already. KH

## 2024-12-25 ENCOUNTER — Ambulatory Visit: Payer: Self-pay | Admitting: Nurse Practitioner

## 2025-01-16 ENCOUNTER — Ambulatory Visit: Admitting: Nurse Practitioner
